# Patient Record
Sex: Female | Born: 2000 | Hispanic: Yes | Marital: Single | Smoking: Never smoker
Health system: Southern US, Community
[De-identification: ages and names within clinical notes are randomized; demographics above are authoritative.]

## PROBLEM LIST (undated history)

## (undated) ENCOUNTER — Inpatient Hospital Stay (HOSPITAL_COMMUNITY): Payer: Self-pay

## (undated) ENCOUNTER — Emergency Department (HOSPITAL_COMMUNITY): Admission: EM | Payer: Medicaid Other

## (undated) DIAGNOSIS — O24419 Gestational diabetes mellitus in pregnancy, unspecified control: Secondary | ICD-10-CM

## (undated) DIAGNOSIS — R519 Headache, unspecified: Secondary | ICD-10-CM

## (undated) DIAGNOSIS — Z789 Other specified health status: Secondary | ICD-10-CM

## (undated) HISTORY — DX: Headache, unspecified: R51.9

## (undated) HISTORY — PX: NO PAST SURGERIES: SHX2092

---
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
## 2019-01-02 ENCOUNTER — Telehealth: Payer: Self-pay | Admitting: Radiology

## 2019-01-02 NOTE — Telephone Encounter (Signed)
Called patient to get correct mailing address, letter sent back wrong address, unable to leave message due to mailbox being full

## 2019-01-03 ENCOUNTER — Encounter (INDEPENDENT_AMBULATORY_CARE_PROVIDER_SITE_OTHER): Payer: Self-pay | Admitting: Primary Care

## 2019-01-03 ENCOUNTER — Other Ambulatory Visit: Payer: Self-pay

## 2019-01-03 ENCOUNTER — Ambulatory Visit (INDEPENDENT_AMBULATORY_CARE_PROVIDER_SITE_OTHER): Payer: Medicaid Other | Admitting: Primary Care

## 2019-01-03 ENCOUNTER — Ambulatory Visit (INDEPENDENT_AMBULATORY_CARE_PROVIDER_SITE_OTHER): Payer: Self-pay | Admitting: Primary Care

## 2019-01-03 VITALS — Ht 62.0 in

## 2019-01-03 DIAGNOSIS — G43009 Migraine without aura, not intractable, without status migrainosus: Secondary | ICD-10-CM

## 2019-01-03 DIAGNOSIS — L729 Follicular cyst of the skin and subcutaneous tissue, unspecified: Secondary | ICD-10-CM

## 2019-01-03 DIAGNOSIS — Z7689 Persons encountering health services in other specified circumstances: Secondary | ICD-10-CM

## 2019-01-03 NOTE — Progress Notes (Signed)
Virtual Visit via Telephone Note  I connected with Janne Lab on 01/03/19 at 10:30 AM EDT by telephone and verified that I am speaking with the correct person using two identifiers.   I discussed the limitations, risks, security and privacy concerns of performing an evaluation and management service by telephone and the availability of in person appointments. I also discussed with the patient that there may be a patient responsible charge related to this service. The patient expressed understanding and agreed to proceed.   History of Present Illness: Ms Dlisa Barnwell presents today to establish care . She is concern with a bump on her face that seems to be getting larger, painful and itching  .   Past Medical History:  Diagnosis Date  . Gestational diabetes   . Medical history non-contributory    Observations/Objective: Review of Systems  Constitutional:       Weight gain  Gastrointestinal: Positive for heartburn, nausea and vomiting.  Skin:       cyst    Assessment and Plan: Neva was seen today for new patient (initial visit).  Diagnoses and all orders for this visit:  Migraine without aura and without status migrainosus, not intractable Signs can be individualized with throbbing pain on any area of the head.  They may come with an aura-dizziness, nausea, sensitive to light sound or smell.  Triggers can be caused by drinking alcohol smoking or certain medications, eating and drinking certain products  This condition may be triggered or caused by: Caffeine, aged cheese, and chocolate.  Encounter to establish care Ms. Robyne Askew is estabhling care now 66 with child transferring from pediatrics and  obgyn   Cyst of subcutaneous tissue Patient to follow up in person visit          Follow Up Instructions:    I discussed the assessment and treatment plan with the patient. The patient was provided an opportunity to ask questions and all were  answered. The patient agreed with the plan and demonstrated an understanding of the instructions.   The patient was advised to call back or seek an in-person evaluation if the symptoms worsen or if the condition fails to improve as anticipated.  I provided 22 minutes of non-face-to-face time during this encounter.   Kerin Perna, NP  Pt has huge bump on face, will not go away. Recently began to hurt and itch  Pt complains of weight gain Pt complains that she gets nauseous out of nowhere. There are times she can not eat due to being nauseous.

## 2019-01-04 ENCOUNTER — Other Ambulatory Visit: Payer: Self-pay

## 2019-01-04 ENCOUNTER — Ambulatory Visit (INDEPENDENT_AMBULATORY_CARE_PROVIDER_SITE_OTHER): Payer: Medicaid Other | Admitting: Primary Care

## 2019-01-04 ENCOUNTER — Encounter (INDEPENDENT_AMBULATORY_CARE_PROVIDER_SITE_OTHER): Payer: Self-pay | Admitting: Primary Care

## 2019-01-04 VITALS — BP 106/72 | HR 94 | Temp 98.5°F | Ht 62.0 in | Wt 165.4 lb

## 2019-01-04 DIAGNOSIS — D2330 Other benign neoplasm of skin of unspecified part of face: Secondary | ICD-10-CM

## 2019-01-04 DIAGNOSIS — Z8632 Personal history of gestational diabetes: Secondary | ICD-10-CM | POA: Diagnosis not present

## 2019-01-04 NOTE — Progress Notes (Signed)
   Acute Office Visit  Subjective:    Patient ID: Robin Strickland, female    DOB: 03/20/2001, 17 y.o.   MRN: [id number]  No chief complaint on file.   HPI Patient is in today for in person visit to evaluate nodule on her face initially was small and getting larger firm cyst no tenderness  Past Medical History:  Diagnosis Date  . Gestational diabetes   . Medical history non-contributory     Past Surgical History:  Procedure Laterality Date  . NO PAST SURGERIES      Family History  Problem Relation Age of Onset  . Healthy Mother   . Healthy Father     Social History   Socioeconomic History  . Marital status: Married    Spouse name: Not on file  . Number of children: Not on file  . Years of education: Not on file  . Highest education level: Not on file  Occupational History  . Not on file  Social Needs  . Financial resource strain: Not on file  . Food insecurity    Worry: Not on file    Inability: Not on file  . Transportation needs    Medical: Not on file    Non-medical: Not on file  Tobacco Use  . Smoking status: Never Smoker  . Smokeless tobacco: Never Used  Substance and Sexual Activity  . Alcohol use: No  . Drug use: No  . Sexual activity: Yes    Birth control/protection: None  Lifestyle  . Physical activity    Days per week: Not on file    Minutes per session: Not on file  . Stress: Not on file  Relationships  . Social connections    Talks on phone: Not on file    Gets together: Not on file    Attends religious service: Not on file    Active member of club or organization: Not on file    Attends meetings of clubs or organizations: Not on file    Relationship status: Not on file  . Intimate partner violence    Fear of current or ex partner: Not on file    Emotionally abused: Not on file    Physically abused: Not on file    Forced sexual activity: Not on file  Other Topics Concern  . Not on file  Social History Narrative   . Not on file    No outpatient medications prior to visit.   No facility-administered medications prior to visit.     No Known Allergies  Review of Systems  Skin:       Cyst see picture       Objective:    Physical Exam  Constitutional: She is oriented to person, place, and time. She appears well-developed and well-nourished.  HENT:  Head: Normocephalic.  Eyes: EOM are normal.  Neck: Neck supple.  Cardiovascular: Normal rate and regular rhythm.  Abdominal: Soft. Bowel sounds are normal. She exhibits distension.  Neurological: She is oriented to person, place, and time.  Skin:  cyst  Psychiatric: She has a normal mood and affect.    BP 106/72 (BP Location: Right Arm, Patient Position: Sitting, Cuff Size: Normal)   Pulse 94   Temp 98.5 F (36.9 C) (Oral)   Ht 5' 2" (1.575 m)   Wt 165 lb 6.4 oz (75 kg)   LMP 12/27/2018 (Approximate)   SpO2 98%   BMI 30.25 kg/m  Wt Readings from Last 3 Encounters:    01/04/19 165 lb 6.4 oz (75 kg) (91 %, Z= 1.34)*  10/31/18 167 lb (75.8 kg) (92 %, Z= 1.39)*  10/28/17 145 lb (65.8 kg) (81 %, Z= 0.88)*   * Growth percentiles are based on CDC (Girls, 2-20 Years) data.    Health Maintenance Due  Topic Date Due  . CHLAMYDIA SCREENING  05/19/2017    There are no preventive care reminders to display for this patient.   No results found for: TSH Lab Results  Component Value Date   WBC 16.2 (H) 06/16/2016   HGB 12.4 06/16/2016   HCT 35.6 (L) 06/16/2016   MCV 89.9 06/16/2016   PLT 361 06/16/2016   Lab Results  Component Value Date   GLUCOSE 69 04/16/2016   No results found for: CHOL No results found for: HDL No results found for: LDLCALC No results found for: TRIG No results found for: CHOLHDL No results found for: HGBA1C     Assessment & Plan:   Problem List Items Addressed This Visit    History of gestational diabetes   Relevant Orders   CBC with Differential   CMP14+EGFR   Hemoglobin A1c    Other Visit  Diagnoses    Sebaceous adenoma of face    -  Primary   Relevant Orders   Ambulatory referral to Dermatology     Diagnoses and all orders for this visit:  Sebaceous adenoma of face   Refer to dermatology    No orders of the defined types were placed in this encounter.    Kerin Perna, NP

## 2019-01-04 NOTE — Patient Instructions (Signed)
Epidermal Cyst  An epidermal cyst is a sac made of skin tissue. The sac contains a substance called keratin. Keratin is a protein that is normally secreted through the hair follicles. When keratin becomes trapped in the top layer of skin (epidermis), it can form an epidermal cyst. Epidermal cysts can be found anywhere on your body. These cysts are usually harmless (benign), and they may not cause symptoms unless they become infected. What are the causes? This condition may be caused by:  A blocked hair follicle.  A hair that curls and re-enters the skin instead of growing straight out of the skin (ingrown hair).  A blocked pore.  Irritated skin.  An injury to the skin.  Certain conditions that are passed along from parent to child (inherited).  Human papillomavirus (HPV).  Long-term (chronic) sun damage to the skin. What increases the risk? The following factors may make you more likely to develop an epidermal cyst:  Having acne.  Being overweight.  Being 30-40 years old. What are the signs or symptoms? The only symptom of this condition may be a small, painless lump underneath the skin. When an epidermal cyst ruptures, it may become infected. Symptoms may include:  Redness.  Inflammation.  Tenderness.  Warmth.  Fever.  Keratin draining from the cyst. Keratin is grayish-white, bad-smelling substance.  Pus draining from the cyst. How is this diagnosed? This condition is diagnosed with a physical exam.  In some cases, you may have a sample of tissue (biopsy) taken from your cyst to be examined under a microscope or tested for bacteria.  You may be referred to a health care provider who specializes in skin care (dermatologist). How is this treated? In many cases, epidermal cysts go away on their own without treatment. If a cyst becomes infected, treatment may include:  Opening and draining the cyst, done by a health care provider. After draining, minor surgery to  remove the rest of the cyst may be done.  Antibiotic medicine.  Injections of medicines (steroids) that help to reduce inflammation.  Surgery to remove the cyst. Surgery may be done if the cyst: ? Becomes large. ? Bothers you. ? Has a chance of turning into cancer.  Do not try to open a cyst yourself. Follow these instructions at home:  Take over-the-counter and prescription medicines only as told by your health care provider.  If you were prescribed an antibiotic medicine, take it it as told by your health care provider. Do not stop using the antibiotic even if you start to feel better.  Keep the area around your cyst clean and dry.  Wear loose, dry clothing.  Avoid touching your cyst.  Check your cyst every day for signs of infection. Check for: ? Redness, swelling, or pain. ? Fluid or blood. ? Warmth. ? Pus or a bad smell.  Keep all follow-up visits as told by your health care provider. This is important. How is this prevented?  Wear clean, dry, clothing.  Avoid wearing tight clothing.  Keep your skin clean and dry. Take showers or baths every day. Contact a health care provider if:  Your cyst develops symptoms of infection.  Your condition is not improving or is getting worse.  You develop a cyst that looks different from other cysts you have had.  You have a fever. Get help right away if:  Redness spreads from the cyst into the surrounding area. Summary  An epidermal cyst is a sac made of skin tissue. These cysts are   usually harmless (benign), and they may not cause symptoms unless they become infected.  If a cyst becomes infected, treatment may include surgery to open and drain the cyst, or to remove it. Treatment may also include medicines by mouth or through an injection.  Take over-the-counter and prescription medicines only as told by your health care provider. If you were prescribed an antibiotic medicine, take it as told by your health care  provider. Do not stop using the antibiotic even if you start to feel better.  Contact a health care provider if your condition is not improving or is getting worse.  Keep all follow-up visits as told by your health care provider. This is important. This information is not intended to replace advice given to you by your health care provider. Make sure you discuss any questions you have with your health care provider. Document Released: 04/24/2004 Document Revised: 09/14/2018 Document Reviewed: 12/05/2017 Elsevier Patient Education  2020 [employer]  

## 2019-01-05 ENCOUNTER — Other Ambulatory Visit (INDEPENDENT_AMBULATORY_CARE_PROVIDER_SITE_OTHER): Payer: Medicaid Other

## 2019-01-15 ENCOUNTER — Telehealth (INDEPENDENT_AMBULATORY_CARE_PROVIDER_SITE_OTHER): Payer: Self-pay

## 2019-01-15 NOTE — Telephone Encounter (Signed)
Patient called to check on status of referral. She also requested a prescription for masks and gloves to be sent to [facility]. She has been told by a pharmacist that medicaid will cover. Nat Christen, CMA

## 2019-01-17 ENCOUNTER — Other Ambulatory Visit (INDEPENDENT_AMBULATORY_CARE_PROVIDER_SITE_OTHER): Payer: Self-pay | Admitting: Primary Care

## 2019-01-17 NOTE — Progress Notes (Unsigned)
m °

## 2019-01-17 NOTE — Telephone Encounter (Signed)
Unable to e prescribe mask and gloves v.o. sent to [facility]

## 2019-01-31 ENCOUNTER — Encounter (INDEPENDENT_AMBULATORY_CARE_PROVIDER_SITE_OTHER): Payer: Self-pay | Admitting: Primary Care

## 2019-01-31 ENCOUNTER — Ambulatory Visit (INDEPENDENT_AMBULATORY_CARE_PROVIDER_SITE_OTHER): Payer: Medicaid Other | Admitting: Primary Care

## 2019-01-31 ENCOUNTER — Other Ambulatory Visit: Payer: Self-pay

## 2019-01-31 VITALS — BP 109/66 | HR 98 | Temp 98.7°F | Ht 62.0 in | Wt 168.0 lb

## 2019-01-31 DIAGNOSIS — G4700 Insomnia, unspecified: Secondary | ICD-10-CM | POA: Diagnosis not present

## 2019-01-31 DIAGNOSIS — Z8632 Personal history of gestational diabetes: Secondary | ICD-10-CM | POA: Diagnosis not present

## 2019-01-31 DIAGNOSIS — N898 Other specified noninflammatory disorders of vagina: Secondary | ICD-10-CM

## 2019-01-31 DIAGNOSIS — G44229 Chronic tension-type headache, not intractable: Secondary | ICD-10-CM | POA: Diagnosis not present

## 2019-01-31 LAB — POCT URINALYSIS DIPSTICK
Bilirubin, UA: NEGATIVE
Glucose, UA: NEGATIVE
Ketones, UA: NEGATIVE
Leukocytes, UA: NEGATIVE
Nitrite, UA: NEGATIVE
Protein, UA: POSITIVE — AB
Spec Grav, UA: >=1.03 — AB (ref 1.010–1.025)
Urobilinogen, UA: 0.2 E.U./dL
pH, UA: 5.5 (ref 5.0–8.0)

## 2019-01-31 MED ORDER — IBUPROFEN 600 MG PO TABS: 600.0000 mg | ORAL_TABLET | Freq: Three times a day (TID) | ORAL | 0 refills | Status: DC | PRN

## 2019-01-31 MED ORDER — IBUPROFEN 600 MG PO TABS: 600.0000 mg | ORAL_TABLET | Freq: Three times a day (TID) | ORAL | 1 refills | Status: DC | PRN

## 2019-01-31 NOTE — Progress Notes (Signed)
Pt is having trouble sleeping through the night, she sometimes has issues falling asleep. Lack of sleep is causing migraines with nausea and vomiting

## 2019-01-31 NOTE — Patient Instructions (Signed)
Can try melatonin 5mg-15 mg at night for sleep, can also do benadryl 25-50mg at night for sleep.  If this does not help we can try prescription medication.  Also here is some information about good sleep hygiene.   Insomnia Insomnia is frequent trouble falling and/or staying asleep. Insomnia can be a long term problem or a short term problem. Both are common. Insomnia can be a short term problem when the wakefulness is related to a certain stress or worry. Long term insomnia is often related to ongoing stress during waking hours and/or poor sleeping habits. Overtime, sleep deprivation itself can make the problem worse. Every little thing feels more severe because you are overtired and your ability to cope is decreased. CAUSES  Stress, anxiety, and depression. Poor sleeping habits. Distractions such as TV in the bedroom. Naps close to bedtime. Engaging in emotionally charged conversations before bed. Technical reading before sleep. Alcohol and other sedatives. They may make the problem worse. They can hurt normal sleep patterns and normal dream activity. Stimulants such as caffeine for several hours prior to bedtime. Pain syndromes and shortness of breath can cause insomnia. Exercise late at night. Changing time zones may cause sleeping problems (jet lag). It is sometimes helpful to have someone observe your sleeping patterns. They should look for periods of not breathing during the night (sleep apnea). They should also look to see how long those periods last. If you live alone or observers are uncertain, you can also be observed at a sleep clinic where your sleep patterns will be professionally monitored. Sleep apnea requires a checkup and treatment. Give your caregivers your medical history. Give your caregivers observations your family has made about your sleep.  SYMPTOMS  Not feeling rested in the morning. Anxiety and restlessness at bedtime. Difficulty falling and staying asleep. TREATMENT   Your caregiver may prescribe treatment for an underlying medical disorders. Your caregiver can give advice or help if you are using alcohol or other drugs for self-medication. Treatment of underlying problems will usually eliminate insomnia problems. Medications can be prescribed for short time use. They are generally not recommended for lengthy use. Over-the-counter sleep medicines are not recommended for lengthy use. They can be habit forming. You can promote easier sleeping by making lifestyle changes such as: Using relaxation techniques that help with breathing and reduce muscle tension. Exercising earlier in the day. Changing your diet and the time of your last meal. No night time snacks. Establish a regular time to go to bed. Counseling can help with stressful problems and worry. Soothing music and white noise may be helpful if there are background noises you cannot remove. Stop tedious detailed work at least one hour before bedtime. HOME CARE INSTRUCTIONS  Keep a diary. Inform your caregiver about your progress. This includes any medication side effects. See your caregiver regularly. Take note of: Times when you are asleep. Times when you are awake during the night. The quality of your sleep. How you feel the next day. This information will help your caregiver care for you. Get out of bed if you are still awake after 15 minutes. Read or do some quiet activity. Keep the lights down. Wait until you feel sleepy and go back to bed. Keep regular sleeping and waking hours. Avoid naps. Exercise regularly. Avoid distractions at bedtime. Distractions include watching television or engaging in any intense or detailed activity like attempting to balance the household checkbook. Develop a bedtime ritual. Keep a familiar routine of bathing, brushing your teeth,   climbing into bed at the same time each night, listening to soothing music. Routines increase the success of falling to sleep faster. Use  relaxation techniques. This can be using breathing and muscle tension release routines. It can also include visualizing peaceful scenes. You can also help control troubling or intruding thoughts by keeping your mind occupied with boring or repetitive thoughts like the old concept of counting sheep. You can make it more creative like imagining planting one beautiful flower after another in your backyard garden. During your day, work to eliminate stress. When this is not possible use some of the previous suggestions to help reduce the anxiety that accompanies stressful situations. MAKE SURE YOU:  Understand these instructions. Will watch your condition. Will get help right away if you are not doing well or get worse. Document Released: 05/21/2000 Document Revised: 08/16/2011 Document Reviewed: 06/21/2007 Exit[employer] Patient Information 2015 [employer]. This information is not intended to replace advice given to you by your health care provider. Make sure you discuss any questions you have with your health care provider.  

## 2019-01-31 NOTE — Progress Notes (Signed)
` Established Patient Office Visit  Subjective:  Patient ID: Robin Strickland, female    DOB: Dec 09, 2000  Age: 18 y.o. MRN: [id number]  CC:  Chief Complaint  Patient presents with  . vaginal odor    HPI Robin Strickland presents for vaginal odor and discharge. She is also complaining of headaches everyday due to the lack of sleep.   Past Medical History:  Diagnosis Date  . Gestational diabetes   . Medical history non-contributory     Past Surgical History:  Procedure Laterality Date  . NO PAST SURGERIES      Family History  Problem Relation Age of Onset  . Healthy Mother   . Healthy Father     Social History   Socioeconomic History  . Marital status: Married    Spouse name: Not on file  . Number of children: Not on file  . Years of education: Not on file  . Highest education level: Not on file  Occupational History  . Not on file  Social Needs  . Financial resource strain: Not on file  . Food insecurity    Worry: Not on file    Inability: Not on file  . Transportation needs    Medical: Not on file    Non-medical: Not on file  Tobacco Use  . Smoking status: Never Smoker  . Smokeless tobacco: Never Used  Substance and Sexual Activity  . Alcohol use: No  . Drug use: No  . Sexual activity: Yes    Birth control/protection: None  Lifestyle  . Physical activity    Days per week: Not on file    Minutes per session: Not on file  . Stress: Not on file  Relationships  . Social Herbalist on phone: Not on file    Gets together: Not on file    Attends religious service: Not on file    Active member of club or organization: Not on file    Attends meetings of clubs or organizations: Not on file    Relationship status: Not on file  . Intimate partner violence    Fear of current or ex partner: Not on file    Emotionally abused: Not on file    Physically abused: Not on file    Forced sexual activity: Not on file  Other  Topics Concern  . Not on file  Social History Narrative  . Not on file    No outpatient medications prior to visit.   No facility-administered medications prior to visit.     No Known Allergies  ROS Review of Systems  Constitutional: Positive for fatigue.  Gastrointestinal: Positive for nausea.  Genitourinary: Positive for vaginal bleeding.       Menstrual cycle   Psychiatric/Behavioral: Positive for sleep disturbance.      Objective:    Physical Exam  Constitutional: She is oriented to person, place, and time. She appears well-developed.  HENT:  Head: Normocephalic.  Neck: Neck supple.  Cardiovascular: Normal rate and regular rhythm.  Pulmonary/Chest: Effort normal and breath sounds normal.  Abdominal: Soft. Bowel sounds are normal. She exhibits distension.  Musculoskeletal: Normal range of motion.  Neurological: She is oriented to person, place, and time.  Skin: Skin is warm and dry.  Psychiatric: She has a normal mood and affect.    BP 109/66 (BP Location: Left Arm, Patient Position: Sitting, Cuff Size: Normal)   Pulse 98   Temp 98.7 F (37.1 C) (Tympanic)   Ht  _0  (1.575 m)   Wt 168 lb (76.2 kg)   LMP 01/28/2019 (Approximate)   SpO2 97%   BMI 30.73 kg/m  Wt Readings from Last 3 Encounters:  01/31/19 168 lb (76.2 kg) (92 %, Z= 1.39)*  01/04/19 165 lb 6.4 oz (75 kg) (91 %, Z= 1.34)*  10/31/18 167 lb (75.8 kg) (92 %, Z= 1.39)*   * Growth percentiles are based on CDC (Girls, 2-20 Years) data.     Health Maintenance Due  Topic Date Due  . CHLAMYDIA SCREENING  05/19/2017  . INFLUENZA VACCINE  01/06/2019    There are no preventive care reminders to display for this patient.  No results found for: TSH Lab Results  Component Value Date   WBC 16.2 (H) 06/16/2016   HGB 12.4 06/16/2016   HCT 35.6 (L) 06/16/2016   MCV 89.9 06/16/2016   PLT 361 06/16/2016   Lab Results  Component Value Date   GLUCOSE 69 04/16/2016     Assessment & Plan:    Robin Strickland was seen today for vaginal odor.  Diagnoses and all orders for this visit:  Vaginal odor Return in 1 week for vaginal discharge currently on her menstrual cycle -     Urinalysis Dipstick  History of gestational diabetes -     Hemoglobin A1c -     CMP14+EGFR -     CBC with Differential  Chronic tension-type headache, not intractable if worsening HA, changes vision/speech, imbalance, weakness go to the ER  Prescribe ibuprofen 600 mg TIDPRN  Insomnia, unspecified type Insomnia is frequent trouble falling and/or staying asleep. Insomnia can be a long term problem or a short term problem. Both are common. Insomnia can be a short term problem when the wakefulness is related to a certain stress or worry. Long term insomnia is often related to ongoing stress during waking hours and/or poor sleeping habits. Overtime, sleep deprivation itself can make the problem worse. Every little thing feels more severe because you are overtired and your ability to cope is decreased. Can try melatonin 87m-15 mg at night for sleep, can also do benadryl 25-556mat night for sleep.   Meds ordered this encounter  Medications  . DISCONTD: ibuprofen (ADVIL) 600 MG tablet    Sig: Take 1 tablet (600 mg total) by mouth every 8 (eight) hours as needed.    Dispense:  30 tablet    Refill:  0  . ibuprofen (ADVIL) 600 MG tablet    Sig: Take 1 tablet (600 mg total) by mouth every 8 (eight) hours as needed.    Dispense:  60 tablet    Refill:  1    Follow-up: Return today (on 01/31/2019) for next week on Thursday if avaiable vaginal discharge.    MiKerin PernaNP

## 2019-02-01 LAB — CMP14+EGFR
ALT: 24 IU/L (ref 0–32)
AST: 19 IU/L (ref 0–40)
Albumin/Globulin Ratio: 1.6 (ref 1.2–2.2)
Albumin: 4.6 g/dL (ref 3.9–5.0)
Alkaline Phosphatase: 87 IU/L (ref 43–101)
BUN/Creatinine Ratio: 12 (ref 9–23)
BUN: 12 mg/dL (ref 6–20)
Bilirubin Total: 0.7 mg/dL (ref 0.0–1.2)
CO2: 22 mmol/L (ref 20–29)
Calcium: 9.4 mg/dL (ref 8.7–10.2)
Chloride: 104 mmol/L (ref 96–106)
Creatinine, Ser: 0.97 mg/dL (ref 0.57–1.00)
GFR calc Af Amer: 99 mL/min/{1.73_m2} (ref >59)
GFR calc non Af Amer: 86 mL/min/{1.73_m2} (ref >59)
Globulin, Total: 2.9 g/dL (ref 1.5–4.5)
Glucose: 122 mg/dL — ABNORMAL HIGH (ref 65–99)
Potassium: 4.3 mmol/L (ref 3.5–5.2)
Sodium: 140 mmol/L (ref 134–144)
Total Protein: 7.5 g/dL (ref 6.0–8.5)

## 2019-02-01 LAB — CBC WITH DIFFERENTIAL/PLATELET
Basophils Absolute: 0 10*3/uL (ref 0.0–0.2)
Basos: 1 %
EOS (ABSOLUTE): 0.2 10*3/uL (ref 0.0–0.4)
Eos: 2 %
Hematocrit: 37.9 % (ref 34.0–46.6)
Hemoglobin: 13 g/dL (ref 11.1–15.9)
Immature Grans (Abs): 0 10*3/uL (ref 0.0–0.1)
Immature Granulocytes: 0 %
Lymphocytes Absolute: 3.3 10*3/uL — ABNORMAL HIGH (ref 0.7–3.1)
Lymphs: 41 %
MCH: 30.5 pg (ref 26.6–33.0)
MCHC: 34.3 g/dL (ref 31.5–35.7)
MCV: 89 fL (ref 79–97)
Monocytes Absolute: 0.5 10*3/uL (ref 0.1–0.9)
Monocytes: 7 %
Neutrophils Absolute: 4 10*3/uL (ref 1.4–7.0)
Neutrophils: 49 %
Platelets: 333 10*3/uL (ref 150–450)
RBC: 4.26 x10E6/uL (ref 3.77–5.28)
RDW: 13 % (ref 11.7–15.4)
WBC: 8.2 10*3/uL (ref 3.4–10.8)

## 2019-02-01 LAB — HEMOGLOBIN A1C
Est. average glucose Bld gHb Est-mCnc: 111 mg/dL
Hgb A1c MFr Bld: 5.5 % (ref 4.8–5.6)

## 2019-02-07 ENCOUNTER — Encounter: Payer: Self-pay | Admitting: *Deleted

## 2019-02-07 ENCOUNTER — Encounter: Payer: Self-pay | Admitting: Advanced Practice Midwife

## 2019-02-07 ENCOUNTER — Ambulatory Visit (INDEPENDENT_AMBULATORY_CARE_PROVIDER_SITE_OTHER): Payer: Medicaid Other | Admitting: Advanced Practice Midwife

## 2019-02-07 ENCOUNTER — Other Ambulatory Visit: Payer: Self-pay

## 2019-02-07 VITALS — BP 111/74 | HR 76 | Wt 168.0 lb

## 2019-02-07 DIAGNOSIS — Z3046 Encounter for surveillance of implantable subdermal contraceptive: Secondary | ICD-10-CM

## 2019-02-07 NOTE — Patient Instructions (Signed)
Nexplanon Instructions After Removal  Keep bandage clean and dry for 24 hours  May use ice/Tylenol/Ibuprofen for soreness or pain  If you develop fever, drainage or increased warmth from incision site-contact office immediately   

## 2019-02-07 NOTE — Progress Notes (Signed)
     GYNECOLOGY OFFICE PROCEDURE NOTE  Robin Strickland is a 18 y.o. G1P1001 here for Nexplanon removal. No pap history (age 85).  No other gynecologic concerns. Patient states she believes her Nexplanon is the cause of her headaches and she was advised to have it removed before additional headache workup. She reports she was "planning to have it out anyway" and "just don't like it".   Nexplanon Removal Patient identified, informed consent performed, consent signed.   Appropriate time out taken. Nexplanon site identified.  Area prepped in usual sterile fashon. One ml of 1% lidocaine was used to anesthetize the area at the distal end of the implant. A small stab incision was made right beside the implant on the distal portion.  The Nexplanon rod was grasped using hemostats and removed without difficulty.  There was minimal blood loss. There were no complications.  3 ml of 1% lidocaine was injected around the incision for post-procedure analgesia.  Steri-strips were applied over the small incision.  A pressure bandage was applied to reduce any bruising.  The patient tolerated the procedure well and was given post procedure instructions.  Patient is not planning to use contraception.  Total visit time: 30 minutes. Greater than 50% of visit spent in counseling and coordination or care.  Mallie Snooks, MSN, CNM Certified Nurse Midwife, [employer] for [employer], [employer] 02/07/19 10:20 AM

## 2019-02-14 DIAGNOSIS — L720 Epidermal cyst: Secondary | ICD-10-CM | POA: Diagnosis not present

## 2019-02-15 DIAGNOSIS — L728 Other follicular cysts of the skin and subcutaneous tissue: Secondary | ICD-10-CM | POA: Diagnosis not present

## 2019-03-05 DIAGNOSIS — L728 Other follicular cysts of the skin and subcutaneous tissue: Secondary | ICD-10-CM | POA: Diagnosis not present

## 2019-03-06 ENCOUNTER — Ambulatory Visit: Payer: Medicaid Other | Admitting: Family Medicine

## 2019-03-09 ENCOUNTER — Encounter: Payer: Medicaid Other | Admitting: Physician Assistant

## 2019-03-09 DIAGNOSIS — R519 Headache, unspecified: Secondary | ICD-10-CM

## 2019-03-12 ENCOUNTER — Ambulatory Visit (INDEPENDENT_AMBULATORY_CARE_PROVIDER_SITE_OTHER): Payer: Medicaid Other | Admitting: Primary Care

## 2019-03-19 DIAGNOSIS — H52223 Regular astigmatism, bilateral: Secondary | ICD-10-CM | POA: Diagnosis not present

## 2019-03-19 DIAGNOSIS — H5213 Myopia, bilateral: Secondary | ICD-10-CM | POA: Diagnosis not present

## 2019-04-04 ENCOUNTER — Other Ambulatory Visit (INDEPENDENT_AMBULATORY_CARE_PROVIDER_SITE_OTHER): Payer: Self-pay | Admitting: Primary Care

## 2019-04-06 ENCOUNTER — Ambulatory Visit (HOSPITAL_COMMUNITY)
Admission: EM | Admit: 2019-04-06 | Discharge: 2019-04-06 | Disposition: A | Discharge status: Home / Self Care | Payer: Medicaid Other | Attending: Family Medicine | Admitting: Family Medicine

## 2019-04-06 ENCOUNTER — Other Ambulatory Visit: Payer: Self-pay

## 2019-04-06 ENCOUNTER — Encounter (HOSPITAL_COMMUNITY): Payer: Self-pay | Admitting: Family Medicine

## 2019-04-06 DIAGNOSIS — L42 Pityriasis rosea: Secondary | ICD-10-CM

## 2019-04-06 NOTE — ED Provider Notes (Signed)
[place]    CSN: [id number] Arrival date & time: 04/06/19  1259      History   Chief Complaint Chief Complaint  Patient presents with  . Rash    HPI Robin Strickland is a 18 y.o. female.   Established [facility] patient with complaint of rash.  Patient developed a rash initially on the inside of her left thigh with a large oval patch.  Subsequently, she developed multiple oval patches on both sides of her body on her torso.  It is minimally itchy.  She has had no fever.  The rash began 2 weeks ago.     Past Medical History:  Diagnosis Date  . Gestational diabetes   . Medical history non-contributory     Patient Active Problem List   Diagnosis Date Noted  . Migraine without aura and without status migrainosus, not intractable 10/28/2017  . History of gestational diabetes 04/19/2016    Past Surgical History:  Procedure Laterality Date  . NO PAST SURGERIES      OB History    Gravida  1   Para  1   Term  1   Preterm      AB      Living  1     SAB      TAB      Ectopic      Multiple  0   Live Births  1            Home Medications    Prior to Admission medications   Medication Sig Start Date End Date Taking? Authorizing Provider  ibuprofen (ADVIL) 600 MG tablet TAKE 1 TABLET (600 MG TOTAL) BY MOUTH EVERY 8 (EIGHT) HOURS AS NEEDED. 04/04/19   Kerin Perna, NP    Family History Family History  Problem Relation Age of Onset  . Healthy Mother   . Healthy Father     Social History Social History   Tobacco Use  . Smoking status: Never Smoker  . Smokeless tobacco: Never Used  Substance Use Topics  . Alcohol use: No  . Drug use: No     Allergies   Patient has no known allergies.   Review of Systems Review of Systems  Skin: Positive for rash.  All other systems reviewed and are negative.    Physical Exam Triage Vital Signs ED Triage Vitals  Enc Vitals Group     BP      Pulse      Resp       Temp      Temp src      SpO2      Weight      Height      Head Circumference      Peak Flow      Pain Score      Pain Loc      Pain Edu?      Excl. in [place]?    No data found.  Updated Vital Signs BP 112/74 (BP Location: Left Arm)   Pulse 93   Temp 98.8 F (37.1 C) (Oral)   Resp 16   SpO2 98%    Physical Exam Vitals signs and nursing note reviewed.  Constitutional:      General: She is not in acute distress.    Appearance: Normal appearance.  Eyes:     Conjunctiva/sclera: Conjunctivae normal.  Neck:     Musculoskeletal: Normal range of motion and neck supple.  Cardiovascular:  Rate and Rhythm: Normal rate.  Pulmonary:     Effort: Pulmonary effort is normal.  Musculoskeletal: Normal range of motion.  Skin:    General: Skin is warm and dry.     Comments: Multiple oval patches lined up along dermatomes on torso.  Neurological:     General: No focal deficit present.     Mental Status: She is alert.  Psychiatric:        Mood and Affect: Mood normal.      UC Treatments / Results  Labs (all labs ordered are listed, but only abnormal results are displayed) Labs Reviewed - No data to display  EKG   Radiology No results found.  Procedures Procedures (including critical care time)  Medications Ordered in UC Medications - No data to display  Initial Impression / Assessment and Plan / UC Course  I have reviewed the triage vital signs and the nursing notes.  Pertinent labs & imaging results that were available during my care of the patient were reviewed by me and considered in my medical decision making (see chart for details).    Final Clinical Impressions(s) / UC Diagnoses   Final diagnoses:  Pityriasis rosea     Discharge Instructions     You can usually control the itching with Zyrtec (Cetirizine) 10 mg daily    ED Prescriptions    None     I have reviewed the PDMP during this encounter.   Robyn Haber, MD 04/06/19 1400

## 2019-04-06 NOTE — Discharge Instructions (Signed)
You can usually control the itching with Zyrtec (Cetirizine) 10 mg daily

## 2019-04-06 NOTE — ED Triage Notes (Signed)
Pt presents to UC w/ rash on left side of abdomen and down left leg x2 weeks.

## 2019-05-08 ENCOUNTER — Telehealth (INDEPENDENT_AMBULATORY_CARE_PROVIDER_SITE_OTHER): Payer: Medicaid Other | Admitting: Primary Care

## 2019-05-08 DIAGNOSIS — K219 Gastro-esophageal reflux disease without esophagitis: Secondary | ICD-10-CM | POA: Diagnosis not present

## 2019-05-08 DIAGNOSIS — R112 Nausea with vomiting, unspecified: Secondary | ICD-10-CM

## 2019-05-08 MED ORDER — OMEPRAZOLE 20 MG PO CPDR: 20.0000 mg | DELAYED_RELEASE_CAPSULE | Freq: Every day | ORAL | 3 refills | Status: DC

## 2019-05-08 NOTE — Progress Notes (Signed)
Virtual Visit via Telephone Note  I connected with Robin Strickland on 05/08/19 at  2:30 PM EST by telephone and verified that I am speaking with the correct person using two identifiers.   I discussed the limitations, risks, security and privacy concerns of performing an evaluation and management service by telephone and the availability of in person appointments. I also discussed with the patient that there may be a patient responsible charge related to this service. The patient expressed understanding and agreed to proceed.   History of Present Illness: Ms. Robin Strickland Robin Strickland is having a tele visit for nausea in the morning and stomach pain and burning not necessarily associated with or without food. Ask patient was she able to take a pregnancy test and call results-yes   Past Medical History:  Diagnosis Date  . Gestational diabetes   . Medical history non-contributory    Observations/Objective: Review of Systems  Gastrointestinal: Positive for abdominal pain, heartburn, nausea and vomiting.  All other systems reviewed and are negative.   Assessment and Plan: Diagnoses and all orders for this visit:  Nausea and vomiting, intractability of vomiting not specified, unspecified vomiting type Pregnancy test negative per patient return called. Try to eat small frequent meals Brat diet than advance to solid small meals   Gastroesophageal reflux disease without esophagitis Discussed eating small frequent meal, reduction in acidic foods, fried foods ,spicy foods, alcohol caffeine and tobacco and certain medications. Avoid laying down after eating 16mins-1hour, elevated head of the bed.  Other orders -     omeprazole (PRILOSEC) 20 MG capsule; Take 1 capsule (20 mg total) by mouth daily.    Follow Up Instructions:    I discussed the assessment and treatment plan with the patient. The patient was provided an opportunity to ask questions and all were answered.  The patient agreed with the plan and demonstrated an understanding of the instructions.   The patient was advised to call back or seek an in-person evaluation if the symptoms worsen or if the condition fails to improve as anticipated.  I provided 18 minutes of non-face-to-face time during this encounter.   Kerin Perna, NP

## 2019-06-06 ENCOUNTER — Other Ambulatory Visit: Payer: Self-pay

## 2019-06-06 ENCOUNTER — Encounter: Payer: Self-pay | Admitting: Family Medicine

## 2019-06-06 ENCOUNTER — Telehealth (INDEPENDENT_AMBULATORY_CARE_PROVIDER_SITE_OTHER): Payer: Medicaid Other | Admitting: Family Medicine

## 2019-06-06 DIAGNOSIS — N912 Amenorrhea, unspecified: Secondary | ICD-10-CM | POA: Diagnosis not present

## 2019-06-06 DIAGNOSIS — O219 Vomiting of pregnancy, unspecified: Secondary | ICD-10-CM

## 2019-06-06 DIAGNOSIS — Z3A01 Less than 8 weeks gestation of pregnancy: Secondary | ICD-10-CM | POA: Diagnosis not present

## 2019-06-06 DIAGNOSIS — Z8632 Personal history of gestational diabetes: Secondary | ICD-10-CM | POA: Diagnosis not present

## 2019-06-06 DIAGNOSIS — Z3480 Encounter for supervision of other normal pregnancy, unspecified trimester: Secondary | ICD-10-CM | POA: Insufficient documentation

## 2019-06-06 DIAGNOSIS — Z3490 Encounter for supervision of normal pregnancy, unspecified, unspecified trimester: Secondary | ICD-10-CM

## 2019-06-06 MED ORDER — PREPLUS 27-1 MG PO TABS: 1.0000 | ORAL_TABLET | Freq: Every day | ORAL | 13 refills | Status: DC

## 2019-06-06 MED ORDER — PROMETHAZINE HCL 25 MG PO TABS: 25.0000 mg | ORAL_TABLET | Freq: Four times a day (QID) | ORAL | 2 refills | Status: DC | PRN

## 2019-06-06 NOTE — Progress Notes (Signed)
I connected with@ on 06/06/19 at  9:45 AM EST by: Mychart vidoe and verified that I am speaking with the correct person using two identifiers.  Patient is located at home and provider is located at [facility].     The purpose of this virtual visit is to provide medical care while limiting exposure to the novel coronavirus. I discussed the limitations, risks, security and privacy concerns of performing an evaluation and management service by MyChart and the availability of in person appointments. I also discussed with the patient that there may be a patient responsible charge related to this service. By engaging in this virtual visit, you consent to the provision of healthcare.  Additionally, you authorize for your insurance to be billed for the services provided during this visit.  The patient expressed understanding and agreed to proceed.  The following staff members participated in the virtual visit:  Chirstine Soliz    Amenorrhea VISIT NOTE  Subjective:  Robin Strickland is a 18 y.o.  G2P1001 at [redacted]w[redacted]d  Having vitrual visit for nausea related to early pregnancy.   Patient reports nausea no vomiting. Denies leaking of fluid.   Nausea started several weeks ago.  Nuase was worsened in the last week. Sensitive to smells. No vomitting.  Gags but unable to eat antyhing. Able to drink water.   The following portions of the patient's history were reviewed and updated as appropriate: allergies, current medications, past family history, past medical history, past social history, past surgical history and problem list.   Objective:  There were no vitals filed for this visit. Self-Obtained  Fetal Status:           Assessment and Plan:  Pregnancy: G2P1001 at [redacted]w[redacted]d   1. Supervision of other normal pregnancy, antepartum Will return for new OB in about 4 wk Interested in NIPT-- does want to know sex of baby - Prenatal Vit-Fe Fumarate-FA (PREPLUS) 27-1 MG TABS; Take 1 tablet by mouth daily.   Dispense: 30 tablet; Refill: 13 - promethazine (PHENERGAN) 25 MG tablet; Take 1 tablet (25 mg total) by mouth every 6 (six) hours as needed for nausea or vomiting.  Dispense: 30 tablet; Refill: 2  2. History of gestational diabetes Needs early screening  3. Nausea and vomiting during pregnancy Reviewed using B6 and unisom Discussed small frequent meals Reviewed importance of drinking fluids and when to seek care based on vomitting and hydration status - promethazine (PHENERGAN) 25 MG tablet; Take 1 tablet (25 mg total) by mouth every 6 (six) hours as needed for nausea or vomiting.  Dispense: 30 tablet; Refill: 2  4. Amenorrhea UPT positive  5. Early stage of pregnancy UPT pos, unsure LMP. Will need [place] at new OB to confirm dates   Face to face time:  25 minutes  Greater than 50% of the visit time was spent in counseling and coordination of care with the patient.  The summary and outline of the counseling and care coordination is summarized in the note above.   All questions were answered.   Preterm labor symptoms and general obstetric precautions including but not limited to vaginal bleeding, contractions, leaking of fluid and fetal movement were reviewed in detail with the patient.  Return in about 3 weeks (around 06/27/2019) for Initial OB visit.  Future Appointments  Date Time Provider [place]  06/27/2019 10:15 AM Darlina Rumpf, CNM [facility]     Time spent on virtual visit: 25 minutes  Caren Macadam, MD

## 2019-06-06 NOTE — Progress Notes (Signed)
hasn't been able to eat anything in the past couple days.   Took 2 UPT's this past Sunday and both were positive. LMP 11/12.  I connected with  Janne Lab on 06/06/19 at  9:45 AM EST by telephone and verified that I am speaking with the correct person using two identifiers.   I discussed the limitations, risks, security and privacy concerns of performing an evaluation and management service by telephone and the availability of in person appointments. I also discussed with the patient that there may be a patient responsible charge related to this service. The patient expressed understanding and agreed to proceed.  Crosby Oyster, RN 06/06/2019  9:47 AM

## 2019-06-08 NOTE — L&D Delivery Note (Signed)
Delivery Note At 5:57 AM a viable female was delivered via Vaginal, Spontaneous (Presentation: right Occiput Anterior).  APGAR: 9, 9; weight.   Placenta status: Spontaneous, Intact.  Cord: 3 vessels with the following complications: None.  Cord blood obtained.   Anesthesia: Epidural Episiotomy: None Lacerations: None Suture Repair: not applicable Est. Blood Loss: 50 ml    Mom to postpartum.  Baby to Couplet care / Skin to Skin.  Nunzio Cory Crowley-Haywood 01/19/2020, 6:20 AM

## 2019-06-27 ENCOUNTER — Other Ambulatory Visit (HOSPITAL_COMMUNITY)
Admission: RE | Admit: 2019-06-27 | Discharge: 2019-06-27 | Disposition: A | Discharge status: Home / Self Care | Payer: Medicaid Other | Source: Ambulatory Visit | Attending: Advanced Practice Midwife | Admitting: Advanced Practice Midwife

## 2019-06-27 ENCOUNTER — Ambulatory Visit (INDEPENDENT_AMBULATORY_CARE_PROVIDER_SITE_OTHER): Payer: Medicaid Other | Admitting: Advanced Practice Midwife

## 2019-06-27 ENCOUNTER — Other Ambulatory Visit: Payer: Self-pay

## 2019-06-27 ENCOUNTER — Encounter: Payer: Self-pay | Admitting: Advanced Practice Midwife

## 2019-06-27 VITALS — BP 119/73 | HR 80 | Wt 165.0 lb

## 2019-06-27 DIAGNOSIS — Z3481 Encounter for supervision of other normal pregnancy, first trimester: Secondary | ICD-10-CM | POA: Insufficient documentation

## 2019-06-27 DIAGNOSIS — Z3A09 9 weeks gestation of pregnancy: Secondary | ICD-10-CM | POA: Diagnosis not present

## 2019-06-27 DIAGNOSIS — O99611 Diseases of the digestive system complicating pregnancy, first trimester: Secondary | ICD-10-CM

## 2019-06-27 DIAGNOSIS — Z23 Encounter for immunization: Secondary | ICD-10-CM | POA: Diagnosis not present

## 2019-06-27 DIAGNOSIS — Z8632 Personal history of gestational diabetes: Secondary | ICD-10-CM

## 2019-06-27 DIAGNOSIS — Z362 Encounter for other antenatal screening follow-up: Secondary | ICD-10-CM | POA: Diagnosis not present

## 2019-06-27 DIAGNOSIS — O99210 Obesity complicating pregnancy, unspecified trimester: Secondary | ICD-10-CM

## 2019-06-27 DIAGNOSIS — K219 Gastro-esophageal reflux disease without esophagitis: Secondary | ICD-10-CM

## 2019-06-27 MED ORDER — ASPIRIN EC 81 MG PO TBEC: 81.0000 mg | DELAYED_RELEASE_TABLET | Freq: Every day | ORAL | 2 refills | Status: DC

## 2019-06-27 MED ORDER — BLOOD PRESSURE KIT: 1.0000 | PACK | 0 refills | Status: DC

## 2019-06-27 NOTE — Patient Instructions (Addendum)
First Trimester of Pregnancy  The first trimester of pregnancy is from week 1 until the end of week 13 (months 1 through 3). During this time, your baby will begin to develop inside you. At 6-8 weeks, the eyes and face are formed, and the heartbeat can be seen on ultrasound. At the end of 12 weeks, all the baby's organs are formed. Prenatal care is all the medical care you receive before the birth of your baby. Make sure you get good prenatal care and follow all of your doctor's instructions. Follow these instructions at home: Medicines  Take over-the-counter and prescription medicines only as told by your doctor. Some medicines are safe and some medicines are not safe during pregnancy.  Take a prenatal vitamin that contains at least 600 micrograms (mcg) of folic acid.  If you have trouble pooping (constipation), take medicine that will make your stool soft (stool softener) if your doctor approves. Eating and drinking   Eat regular, healthy meals.  Your doctor will tell you the amount of weight gain that is right for you.  Avoid raw meat and uncooked cheese.  If you feel sick to your stomach (nauseous) or throw up (vomit): ? Eat 4 or 5 small meals a day instead of 3 large meals. ? Try eating a few soda crackers. ? Drink liquids between meals instead of during meals.  To prevent constipation: ? Eat foods that are high in fiber, like fresh fruits and vegetables, whole grains, and beans. ? Drink enough fluids to keep your pee (urine) clear or pale yellow. Activity  Exercise only as told by your doctor. Stop exercising if you have cramps or pain in your lower belly (abdomen) or low back.  Do not exercise if it is too hot, too humid, or if you are in a place of great height (high altitude).  Try to avoid standing for long periods of time. Move your legs often if you must stand in one place for a long time.  Avoid heavy lifting.  Wear low-heeled shoes. Sit and stand up  straight.  You can have sex unless your doctor tells you not to. Relieving pain and discomfort  Wear a good support bra if your breasts are sore.  Take warm water baths (sitz baths) to soothe pain or discomfort caused by hemorrhoids. Use hemorrhoid cream if your doctor says it is okay.  Rest with your legs raised if you have leg cramps or low back pain.  If you have puffy, bulging veins (varicose veins) in your legs: ? Wear support hose or compression stockings as told by your doctor. ? Raise (elevate) your feet for 15 minutes, 3-4 times a day. ? Limit salt in your food. Prenatal care  Schedule your prenatal visits by the twelfth week of pregnancy.  Write down your questions. Take them to your prenatal visits.  Keep all your prenatal visits as told by your doctor. This is important. Safety  Wear your seat belt at all times when driving.  Make a list of emergency phone numbers. The list should include numbers for family, friends, the hospital, and police and fire departments. General instructions  Ask your doctor for a referral to a local prenatal class. Begin classes no later than at the start of month 6 of your pregnancy.  Ask for help if you need counseling or if you need help with nutrition. Your doctor can give you advice or tell you where to go for help.  Do not use hot tubs, steam   rooms, or saunas.  Do not douche or use tampons or scented sanitary pads.  Do not cross your legs for long periods of time.  Avoid all herbs and alcohol. Avoid drugs that are not approved by your doctor.  Do not use any tobacco products, including cigarettes, chewing tobacco, and electronic cigarettes. If you need help quitting, ask your doctor. You may get counseling or other support to help you quit.  Avoid cat litter boxes and soil used by cats. These carry germs that can cause birth defects in the baby and can cause a loss of your baby (miscarriage) or stillbirth.  Visit your dentist.  At home, brush your teeth with a soft toothbrush. Be gentle when you floss. Contact a doctor if:  You are dizzy.  You have mild cramps or pressure in your lower belly.  You have a nagging pain in your belly area.  You continue to feel sick to your stomach, you throw up, or you have watery poop (diarrhea).  You have a bad smelling fluid coming from your vagina.  You have pain when you pee (urinate).  You have increased puffiness (swelling) in your face, hands, legs, or ankles. Get help right away if:  You have a fever.  You are leaking fluid from your vagina.  You have spotting or bleeding from your vagina.  You have very bad belly cramping or pain.  You gain or lose weight rapidly.  You throw up blood. It may look like coffee grounds.  You are around people who have German measles, fifth disease, or chickenpox.  You have a very bad headache.  You have shortness of breath.  You have any kind of trauma, such as from a fall or a car accident. Summary  The first trimester of pregnancy is from week 1 until the end of week 13 (months 1 through 3).  To take care of yourself and your unborn baby, you will need to eat healthy meals, take medicines only if your doctor tells you to do so, and do activities that are safe for you and your baby.  Keep all follow-up visits as told by your doctor. This is important as your doctor will have to ensure that your baby is healthy and growing well. This information is not intended to replace advice given to you by your health care provider. Make sure you discuss any questions you have with your health care provider. Document Revised: 09/14/2018 Document Reviewed: 06/01/2016 Elsevier Patient Education  2020 Elsevier Inc.  Safe Medications in Pregnancy   Acne: Benzoyl Peroxide Salicylic Acid  Backache/Headache: Tylenol: 2 regular strength every 4 hours OR              2 Extra strength every 6  hours  Colds/Coughs/Allergies: Benadryl (alcohol free) 25 mg every 6 hours as needed Breath right strips Claritin Cepacol throat lozenges Chloraseptic throat spray Cold-Eeze- up to three times per day Cough drops, alcohol free Flonase (by prescription only) Guaifenesin Mucinex Robitussin DM (plain only, alcohol free) Saline nasal spray/drops Sudafed (pseudoephedrine) & Actifed ** use only after [redacted] weeks gestation and if you do not have high blood pressure Tylenol Vicks Vaporub Zinc lozenges Zyrtec   Constipation: Colace Ducolax suppositories Fleet enema Glycerin suppositories Metamucil Milk of magnesia Miralax Senokot Smooth move tea  Diarrhea: Kaopectate Imodium A-D  *NO pepto Bismol  Hemorrhoids: Anusol Anusol HC Preparation H Tucks  Indigestion: Tums Maalox Mylanta Zantac  Pepcid  Insomnia: Benadryl (alcohol free) 25mg every 6 hours as needed   Tylenol PM Unisom, no Gelcaps  Leg Cramps: Tums MagGel  Nausea/Vomiting:  Bonine Dramamine Emetrol Ginger extract Sea bands Meclizine  Nausea medication to take during pregnancy:  Unisom (doxylamine succinate 25 mg tablets) Take one tablet daily at bedtime. If symptoms are not adequately controlled, the dose can be increased to a maximum recommended dose of two tablets daily (1/2 tablet in the morning, 1/2 tablet mid-afternoon and one at bedtime). Vitamin B6 100mg tablets. Take one tablet twice a day (up to 200 mg per day).  Skin Rashes: Aveeno products Benadryl cream or 25mg every 6 hours as needed Calamine Lotion 1% cortisone cream  Yeast infection: Gyne-lotrimin 7 Monistat 7   **If taking multiple medications, please check labels to avoid duplicating the same active ingredients **take medication as directed on the label ** Do not exceed 4000 mg of tylenol in 24 hours **Do not take medications that contain aspirin or ibuprofen   

## 2019-06-27 NOTE — Progress Notes (Signed)
History:   Robin Strickland is a 19 y.o. G2P1001 at [redacted]w[redacted]d by LMP c/w clinic viability scan performed today. She is being seen today for her first obstetrical visit.  Her obstetrical history is significant for maternal obesity and Gestational Hypertension. Patient does intend to breast feed. She reports breastfeeding her daughter. Pregnancy history fully reviewed.  Patient reports epigastric pain after certain meals. She endorses consistent pain when she consumes spicy food. Pain resolves with diet revision. She endorses craving cold fruit and ice water so this is not a common problem for her.  Patient lives with her husband and daughter. She previously worked for [employer] but reports that she decided to take off work for the first three months of her pregnancy. She is a non-smoker, denies SI, HI, IPV.  Patient was previously prescribed Phenergan for morning sickness but states she only took it 2-3 times and denies current difficulty with emesis.   HISTORY: OB History  Gravida Para Term Preterm AB Living  2 1 1  0 0 1  SAB TAB Ectopic Multiple Live Births  0 0 0 0 1    # Outcome Date GA Lbr Len/2nd Weight Sex Delivery Anes PTL Lv  2 Current           1 Term 06/17/16 [redacted]w[redacted]d 02:57 / 00:50 7 lb 4 oz (3.289 kg) F Vag-Spont EPI  LIV     Name: GEARLDEAN, MCCURTY     Apgar1: 8  Apgar5: 9    No pap smear history (age 79).  Past Medical History:  Diagnosis Date  . Gestational diabetes   . Medical history non-contributory    Past Surgical History:  Procedure Laterality Date  . NO PAST SURGERIES     Family History  Problem Relation Age of Onset  . Healthy Mother   . Healthy Father    Social History   Tobacco Use  . Smoking status: Never Smoker  . Smokeless tobacco: Never Used  Substance Use Topics  . Alcohol use: No  . Drug use: No   No Known Allergies Current Outpatient Medications on File Prior to Visit  Medication Sig Dispense Refill  . Prenatal Vit-Fe  Fumarate-FA (PREPLUS) 27-1 MG TABS Take 1 tablet by mouth daily. 30 tablet 13  . promethazine (PHENERGAN) 25 MG tablet Take 1 tablet (25 mg total) by mouth every 6 (six) hours as needed for nausea or vomiting. 30 tablet 2  . ibuprofen (ADVIL) 600 MG tablet TAKE 1 TABLET (600 MG TOTAL) BY MOUTH EVERY 8 (EIGHT) HOURS AS NEEDED. (Patient not taking: Reported on 06/06/2019) 60 tablet 1  . omeprazole (PRILOSEC) 20 MG capsule Take 1 capsule (20 mg total) by mouth daily. (Patient not taking: Reported on 06/06/2019) 30 capsule 3   No current facility-administered medications on file prior to visit.    Review of Systems Pertinent items noted in HPI and remainder of comprehensive ROS otherwise negative. Physical Exam:   Vitals:   06/27/19 1028  BP: 119/73  Pulse: 80  Weight: 165 lb (74.8 kg)   Fetal Heart Rate (bpm): 168 Uterus:     Pelvic Exam: Perineum: no hemorrhoids, normal perineum   Vulva: normal external genitalia, no lesions   Vagina:  normal mucosa, normal discharge   Cervix: no lesions and normal, pap smear done.    Adnexa: normal adnexa and no mass, fullness, tenderness   Bony Pelvis: average  System: General: well-developed, well-nourished female in no acute distress   Breasts:  normal appearance, no  masses or tenderness bilaterally   Skin: normal coloration and turgor, no rashes   Neurologic: oriented, normal, negative, normal mood   Extremities: normal strength, tone, and muscle mass, ROM of all joints is normal   HEENT PERRLA, extraocular movement intact and sclera clear, anicteric   Mouth/Teeth mucous membranes moist, pharynx normal without lesions and dental hygiene good   Neck supple and no masses   Cardiovascular: regular rate and rhythm   Respiratory:  no respiratory distress, normal breath sounds   Abdomen: soft, non-tender; bowel sounds normal; no masses,  no organomegaly  Bedside Ultrasound for FHR check: Patient informed that the ultrasound is considered a limited  obstetric ultrasound and is not intended to be a complete ultrasound exam.  Patient also informed that the ultrasound is not being completed with the intent of assessing for fetal or placental anomalies or any pelvic abnormalities.  Explained that the purpose of today's ultrasound is to assess for fetal heart rate.  Patient acknowledges the purpose of the exam and the limitations of the study.     Assessment:    Pregnancy: G2P1001 Patient Active Problem List   Diagnosis Date Noted  . Supervision of other normal pregnancy, antepartum 06/06/2019  . Migraine without aura and without status migrainosus, not intractable 10/28/2017  . History of gestational diabetes 04/19/2016   Indications for ASA therapy (per uptodate) One of the following: Previous pregnancy with preeclampsia, especially early onset and with an adverse outcome No Multifetal gestation No Chronic hypertension No Type 1 or 2 diabetes mellitus No Chronic kidney disease No Autoimmune disease (antiphospholipid syndrome, systemic lupus erythematosus) No  Two or more of the following: Nulliparity No Obesity (body mass index >30 kg/m2) Yes Family history of preeclampsia in mother or sister No Age ?35 years No Sociodemographic characteristics (African American race, low socioeconomic level) Yes Personal risk factors (eg, previous pregnancy with low birth weight or small for gestational age infant, previous adverse pregnancy outcome [eg, stillbirth], interval >10 years between pregnancies) No  Indications for early 1 hour GTT (per uptodate)  BMI >25 (>23 in Asian women) AND one of the following  Gestational diabetes mellitus in a previous pregnancy Yes  Plan:    1. Encounter for supervision of other normal pregnancy in first trimester - Continue routine care - Culture, OB Urine - GC/Chlamydia probe amp ([place])not at [facility] - Enroll Patient in Babyscripts - Babyscripts Schedule Optimization - [place] MFM OB COMP + 14 WK;  Future - Obstetric Panel, Including HIV; Future - Genetic Screening; Future - Hemoglobin A1c; Future - Protein / creatinine ratio, urine; Future - Comprehensive metabolic panel; Future - TSH; Future - Glucose tolerance, 1 hour; Future  2. History of gestational diabetes - Early GTT, baseline labs as above  3. Gastroesophageal reflux disease without esophagitis - Symptoms consistently resolve with diet modification. Continue PRN - May introduce medication management for worsening acuity  4. Obesity in pregnancy - BMI 30 today - Advised bASA 81 mg daily  - Discussed recommendations for weight management in pregnancy  Initial labs drawn. Continue prenatal vitamins. Genetic Screening discussed, First trimester screen, Quad screen and NIPS: requested. Ultrasound discussed; fetal anatomic survey: ordered. Problem list reviewed and updated. The nature of [place] with multiple MDs and other Advanced Practice Providers was explained to patient; also emphasized that residents, students are part of our team. Routine obstetric precautions reviewed. Return in about 2 weeks (around 07/11/2019) for Lab only and early GTT in two  weeks.   RTC at 16 werks, virtual if patient has received bp cuff   Mallie Snooks, MSN, CNM Certified Nurse Midwife, [employer] for [employer], [place] 06/27/19 12:45 PM

## 2019-06-27 NOTE — Progress Notes (Signed)
  DATING AND VIABILITY SONOGRAM   Shelsy Venetia Night Marshai Okuno is a 19 y.o. year old G2P1001 with LMP Patient's last menstrual period was 04/19/2019 (approximate). which would correlate to  [redacted]w[redacted]d weeks gestation.  She has irregular menstrual cycles.   She is here today for a confirmatory initial sonogram.    GESTATION: SINGLETON yes     FETAL ACTIVITY:          Heart rate     168          The fetus is active.    GESTATIONAL AGE AND  BIOMETRICS:  Gestational criteria: Estimated Date of Delivery: 01/24/20 by LMP now at [redacted]w[redacted]d  Previous Scans:0  GESTATIONAL SAC            mm         weeks  CROWN RUMP LENGTH          3.21 mm         10.0 weeks                                                   AVERAGE EGA(BY THIS SCAN):  10.0 weeks  WORKING EDD( LMP ):  01/24/2020     TECHNICIAN COMMENTS:  Patient informed that the ultrasound is considered a limited obstetric ultrasound and is not intended to be a complete ultrasound exam. Patient also informed that the ultrasound is not being completed with the intent of assessing for fetal or placental anomalies or any pelvic abnormalities. Explained that the purpose of today's ultrasound is to assess for fetal heart rate. Patient acknowledges the purpose of the exam and the limitations of the study.       A copy of this report including all images has been saved and backed up to a second source for retrieval if needed. All measures and details of the anatomical scan, placentation, fluid volume and pelvic anatomy are contained in that report.  Crosby Oyster 06/27/2019 10:49 AM

## 2019-06-28 LAB — GC/CHLAMYDIA PROBE AMP (~~LOC~~) NOT AT ARMC
Chlamydia: NEGATIVE
Comment: NEGATIVE
Comment: NORMAL
Neisseria Gonorrhea: NEGATIVE

## 2019-07-01 LAB — URINE CULTURE, OB REFLEX

## 2019-07-01 LAB — CULTURE, OB URINE

## 2019-07-02 ENCOUNTER — Other Ambulatory Visit: Payer: Self-pay | Admitting: Advanced Practice Midwife

## 2019-07-02 ENCOUNTER — Encounter (INDEPENDENT_AMBULATORY_CARE_PROVIDER_SITE_OTHER): Payer: Self-pay | Admitting: Primary Care

## 2019-07-02 DIAGNOSIS — O2340 Unspecified infection of urinary tract in pregnancy, unspecified trimester: Secondary | ICD-10-CM

## 2019-07-02 DIAGNOSIS — O2341 Unspecified infection of urinary tract in pregnancy, first trimester: Secondary | ICD-10-CM

## 2019-07-02 DIAGNOSIS — O0990 Supervision of high risk pregnancy, unspecified, unspecified trimester: Secondary | ICD-10-CM | POA: Diagnosis not present

## 2019-07-02 HISTORY — DX: Unspecified infection of urinary tract in pregnancy, unspecified trimester: O23.40

## 2019-07-02 MED ORDER — CEPHALEXIN 500 MG PO CAPS: 500.0000 mg | ORAL_CAPSULE | Freq: Four times a day (QID) | ORAL | 2 refills | Status: DC

## 2019-07-02 NOTE — Progress Notes (Signed)
E coli on urine culture collected at [facility]. Patient notified via active MyChart account.  Mallie Snooks, MSN, CNM Certified Nurse Midwife, [facility] for [employer], [place] 07/02/19 10:48 AM

## 2019-07-11 ENCOUNTER — Other Ambulatory Visit (HOSPITAL_COMMUNITY)
Admission: RE | Admit: 2019-07-11 | Discharge: 2019-07-11 | Disposition: A | Discharge status: Home / Self Care | Payer: Medicaid Other | Source: Ambulatory Visit | Attending: Advanced Practice Midwife | Admitting: Advanced Practice Midwife

## 2019-07-11 ENCOUNTER — Other Ambulatory Visit: Payer: Self-pay

## 2019-07-11 ENCOUNTER — Ambulatory Visit (INDEPENDENT_AMBULATORY_CARE_PROVIDER_SITE_OTHER): Payer: Medicaid Other | Admitting: Advanced Practice Midwife

## 2019-07-11 VITALS — BP 103/69 | HR 72 | Wt 165.2 lb

## 2019-07-11 DIAGNOSIS — N898 Other specified noninflammatory disorders of vagina: Secondary | ICD-10-CM | POA: Insufficient documentation

## 2019-07-11 DIAGNOSIS — Z3481 Encounter for supervision of other normal pregnancy, first trimester: Secondary | ICD-10-CM

## 2019-07-11 DIAGNOSIS — O26899 Other specified pregnancy related conditions, unspecified trimester: Secondary | ICD-10-CM

## 2019-07-11 DIAGNOSIS — R102 Other specified pregnancy related conditions, unspecified trimester: Secondary | ICD-10-CM

## 2019-07-11 DIAGNOSIS — Z3A11 11 weeks gestation of pregnancy: Secondary | ICD-10-CM

## 2019-07-11 DIAGNOSIS — O26891 Other specified pregnancy related conditions, first trimester: Secondary | ICD-10-CM

## 2019-07-11 DIAGNOSIS — Z3480 Encounter for supervision of other normal pregnancy, unspecified trimester: Secondary | ICD-10-CM

## 2019-07-11 NOTE — Progress Notes (Signed)
   PRENATAL VISIT NOTE  Subjective:  Robin Strickland is a 19 y.o. G2P1001 at [redacted]w[redacted]d being seen today for lab work and added to Provider schedule for investigation of multiple complaints as below.  She is currently monitored for the following issues for this low-risk pregnancy and has History of gestational diabetes; Migraine without aura and without status migrainosus, not intractable; Supervision of other normal pregnancy, antepartum; and UTI (urinary tract infection) during pregnancy on their problem list.  Patient reports recurrent suprapubic pain, new onset 07/02/2019, most recent episode two days ago. At its worst, her pain is 7/10 and resolves with time and rest. She denies aggravating or alleviating factors. She has not taken medication or tried other treatments for this complaint. She is s/p diagnosis of UTI and has "almost finished" her Keflex prescription. She also c/o abnormal vaginal discharge that is thick "grainy" and yellow-tinged. This is a new problem, onset 2-3 days ago.  Contractions: none. Denies vaginal bleeding, dysuria, abdominal tenderness, flank pain, fever.  Patient endorses "forcing myself" to have bowel movements daily. She reports sitting on the commode for prolonged periods of time, denies constipation. She endorses diet predominantly composed of carbs, prefers homemade food, broccoli is "only vegetable I like".  The following portions of the patient's history were reviewed and updated as appropriate: allergies, current medications, past family history, past medical history, past social history, past surgical history and problem list. Problem list updated.  Objective:   Vitals:   07/11/19 0829  BP: 103/69  Pulse: 72  Weight: 165 lb 3.2 oz (74.9 kg)    Fetal Status: Fetal Heart Rate (bpm): 125         General:  Alert, oriented and cooperative. Patient is in no acute distress.  Skin: Skin is warm and dry. No rash noted.   Cardiovascular: Normal heart  rate noted  Respiratory: Normal respiratory effort, no problems with respiration noted  Abdomen: Soft, gravid, appropriate for gestational age. Non-tender, bowel sounds   Pain/Pressure: Absent     Pelvic: Cervical exam deferred      No abnormal discharge present today. Picture brought by patient demonstrates discharge WNL  Extremities: Normal range of motion.     Mental Status: Normal mood and affect. Normal behavior. Normal judgment and thought content.   Assessment and Plan:  Pregnancy: G2P1001 at [redacted]w[redacted]d  1. Encounter for supervision of other normal pregnancy in first trimester - Continue routine care, present for New OB lab work  - Reviewed diet recommendations for well-rounded nutrition in pregnancy - Start daily bASA for PEC prophylaxis tomorrow at [redacted] weeks GA - Glucose tolerance, 1 hour - TSH - Comprehensive metabolic panel - Protein / creatinine ratio, urine - Hemoglobin A1c - Obstetric Panel, Including HIV  2. Pelvic pain affecting pregnancy, antepartum - Urine TOC next visit - Avoid prolonged periods on commode/straining - Cervicovaginal ancillary only  3. Vaginal discharge during pregnancy, antepartum - Cervicovaginal ancillary only  Preterm labor symptoms and general obstetric precautions including but not limited to vaginal bleeding, contractions, leaking of fluid and fetal movement were reviewed in detail with the patient. Please refer to After Visit Summary for other counseling recommendations.    Future Appointments  Date Time Provider [place]  08/06/2019  1:15 PM Aletha Halim, MD [facility]    Darlina Rumpf, [place]

## 2019-07-11 NOTE — Patient Instructions (Signed)

## 2019-07-11 NOTE — Progress Notes (Signed)
Patient reports abdominal pain x 2 days.

## 2019-07-12 ENCOUNTER — Encounter (INDEPENDENT_AMBULATORY_CARE_PROVIDER_SITE_OTHER): Payer: Self-pay | Admitting: Primary Care

## 2019-07-12 LAB — CERVICOVAGINAL ANCILLARY ONLY
Bacterial Vaginitis (gardnerella): NEGATIVE
Candida Glabrata: NEGATIVE
Candida Vaginitis: NEGATIVE
Chlamydia: NEGATIVE
Comment: NEGATIVE
Comment: NEGATIVE
Comment: NEGATIVE
Comment: NEGATIVE
Comment: NEGATIVE
Comment: NORMAL
Neisseria Gonorrhea: NEGATIVE
Trichomonas: NEGATIVE

## 2019-07-12 LAB — COMPREHENSIVE METABOLIC PANEL
ALT: 27 IU/L (ref 0–32)
AST: 19 IU/L (ref 0–40)
Albumin/Globulin Ratio: 1.3 (ref 1.2–2.2)
Albumin: 4 g/dL (ref 3.9–5.0)
Alkaline Phosphatase: 79 IU/L (ref 39–117)
BUN/Creatinine Ratio: 11 (ref 9–23)
BUN: 6 mg/dL (ref 6–20)
Bilirubin Total: 0.4 mg/dL (ref 0.0–1.2)
CO2: 20 mmol/L (ref 20–29)
Calcium: 9.1 mg/dL (ref 8.7–10.2)
Chloride: 101 mmol/L (ref 96–106)
Creatinine, Ser: 0.57 mg/dL (ref 0.57–1.00)
GFR calc Af Amer: 155 mL/min/{1.73_m2} (ref >59)
GFR calc non Af Amer: 135 mL/min/{1.73_m2} (ref >59)
Globulin, Total: 3.2 g/dL (ref 1.5–4.5)
Glucose: 178 mg/dL — ABNORMAL HIGH (ref 65–99)
Potassium: 3.9 mmol/L (ref 3.5–5.2)
Sodium: 137 mmol/L (ref 134–144)
Total Protein: 7.2 g/dL (ref 6.0–8.5)

## 2019-07-12 LAB — OBSTETRIC PANEL, INCLUDING HIV
Antibody Screen: NEGATIVE
Basophils Absolute: 0 10*3/uL (ref 0.0–0.2)
Basos: 0 %
EOS (ABSOLUTE): 0.1 10*3/uL (ref 0.0–0.4)
Eos: 1 %
HIV Screen 4th Generation wRfx: NONREACTIVE
Hematocrit: 37.2 % (ref 34.0–46.6)
Hemoglobin: 12.3 g/dL (ref 11.1–15.9)
Hepatitis B Surface Ag: NEGATIVE
Immature Grans (Abs): 0.1 10*3/uL (ref 0.0–0.1)
Immature Granulocytes: 1 %
Lymphocytes Absolute: 2.5 10*3/uL (ref 0.7–3.1)
Lymphs: 24 %
MCH: 30.1 pg (ref 26.6–33.0)
MCHC: 33.1 g/dL (ref 31.5–35.7)
MCV: 91 fL (ref 79–97)
Monocytes Absolute: 0.6 10*3/uL (ref 0.1–0.9)
Monocytes: 6 %
Neutrophils Absolute: 7.1 10*3/uL — ABNORMAL HIGH (ref 1.4–7.0)
Neutrophils: 68 %
Platelets: 343 10*3/uL (ref 150–450)
RBC: 4.09 x10E6/uL (ref 3.77–5.28)
RDW: 12.5 % (ref 11.7–15.4)
RPR Ser Ql: NONREACTIVE
Rh Factor: POSITIVE
Rubella Antibodies, IGG: 1.43 index (ref >0.99)
WBC: 10.4 10*3/uL (ref 3.4–10.8)

## 2019-07-12 LAB — PROTEIN / CREATININE RATIO, URINE
Creatinine, Urine: 283.1 mg/dL
Protein, Ur: 26.4 mg/dL
Protein/Creat Ratio: 93 mg/g creat (ref 0–200)

## 2019-07-12 LAB — HEMOGLOBIN A1C
Est. average glucose Bld gHb Est-mCnc: 105 mg/dL
Hgb A1c MFr Bld: 5.3 % (ref 4.8–5.6)

## 2019-07-12 LAB — TSH: TSH: 0.586 u[IU]/mL (ref 0.450–4.500)

## 2019-07-12 LAB — GLUCOSE TOLERANCE, 1 HOUR: Glucose, 1Hr PP: 180 mg/dL (ref 65–199)

## 2019-07-18 ENCOUNTER — Encounter: Payer: Self-pay | Admitting: Radiology

## 2019-07-18 ENCOUNTER — Telehealth: Payer: Self-pay | Admitting: Radiology

## 2019-07-18 NOTE — Telephone Encounter (Signed)
Patient informed of panorama results

## 2019-07-31 ENCOUNTER — Encounter: Payer: Self-pay | Admitting: Radiology

## 2019-08-06 ENCOUNTER — Other Ambulatory Visit: Payer: Self-pay

## 2019-08-06 ENCOUNTER — Encounter: Payer: Self-pay | Admitting: Obstetrics and Gynecology

## 2019-08-06 ENCOUNTER — Telehealth (INDEPENDENT_AMBULATORY_CARE_PROVIDER_SITE_OTHER): Payer: Medicaid Other | Admitting: Obstetrics and Gynecology

## 2019-08-06 DIAGNOSIS — Z8632 Personal history of gestational diabetes: Secondary | ICD-10-CM

## 2019-08-06 DIAGNOSIS — Z3480 Encounter for supervision of other normal pregnancy, unspecified trimester: Secondary | ICD-10-CM

## 2019-08-06 DIAGNOSIS — Z3A15 15 weeks gestation of pregnancy: Secondary | ICD-10-CM | POA: Diagnosis not present

## 2019-08-06 DIAGNOSIS — O2342 Unspecified infection of urinary tract in pregnancy, second trimester: Secondary | ICD-10-CM

## 2019-08-06 NOTE — Progress Notes (Signed)
Patient reports not taking blood pressure or weight in a couple of weeks.

## 2019-08-06 NOTE — Progress Notes (Signed)
   TELEHEALTH VIRTUAL OBSTETRICS VISIT ENCOUNTER NOTE  Clinic: [facility]  I connected with Robin Strickland on 08/06/19 at  1:15 PM EST by telephone at home and verified that I am speaking with the correct person using two identifiers.   I discussed the limitations, risks, security and privacy concerns of performing an evaluation and management service by telephone and the availability of in person appointments. I also discussed with the patient that there may be a patient responsible charge related to this service. The patient expressed understanding and agreed to proceed.  Subjective:  Robin Strickland is a 19 y.o. G2P1001 at [redacted]w[redacted]d being followed for ongoing prenatal care.  She is currently monitored for the following issues for this low-risk pregnancy and has Migraine without aura and without status migrainosus, not intractable; Supervision of other normal pregnancy, antepartum; and UTI (urinary tract infection) during pregnancy on their problem list.  Patient reports b/l LE symmetric edema. Reports fetal movement. Denies any contractions, bleeding or leaking of fluid.   The following portions of the patient's history were reviewed and updated as appropriate: allergies, current medications, past family history, past medical history, past social history, past surgical history and problem list.   Objective:  There were no vitals filed for this visit.  Babyscripts Data Reviewed: not applicable  General:  Alert, oriented and cooperative.   Mental Status: Normal mood and affect perceived. Normal judgment and thought content.  Rest of physical exam deferred due to type of encounter  Assessment and Plan:  Pregnancy: G2P1001 at [redacted]w[redacted]d 1. Urinary tract infection in mother during second trimester of pregnancy Pt to come in and leave toc sample and to check weight and bp (pt not checking). Also told her she can do afp if she desires - Culture,  OB Urine; Future  2. History of gestational diabetes Early a1c neg  3. Supervision of other normal pregnancy, antepartum Anatomy u/s already scheduled Recommended feet elevation, TED/compression hoses  Preterm labor symptoms and general obstetric precautions including but not limited to vaginal bleeding, contractions, leaking of fluid and fetal movement were reviewed in detail with the patient.  I discussed the assessment and treatment plan with the patient. The patient was provided an opportunity to ask questions and all were answered. The patient agreed with the plan and demonstrated an understanding of the instructions. The patient was advised to call back or seek an in-person office evaluation/go to [facility] at [facility] for any urgent or concerning symptoms. Please refer to After Visit Summary for other counseling recommendations.   I provided 7 minutes of non-face-to-face time during this encounter. The visit was conducted via MyChart-medicine  Return in about 1 month (around 09/06/2019) for low risk, virtual visit.  Future Appointments  Date Time Provider Department Center  09/03/2019  2:30 PM Donnamae Jude, MD [facility]  09/14/2019  1:30 PM [place], [place] for [facility], [place]

## 2019-09-03 ENCOUNTER — Telehealth: Payer: Medicaid Other | Admitting: Family Medicine

## 2019-09-14 ENCOUNTER — Other Ambulatory Visit: Payer: Self-pay

## 2019-09-14 ENCOUNTER — Ambulatory Visit (HOSPITAL_COMMUNITY)
Admission: RE | Admit: 2019-09-14 | Discharge: 2019-09-14 | Disposition: A | Discharge status: Home / Self Care | Payer: Medicaid Other | Source: Ambulatory Visit | Attending: Obstetrics and Gynecology | Admitting: Obstetrics and Gynecology

## 2019-09-14 DIAGNOSIS — O09292 Supervision of pregnancy with other poor reproductive or obstetric history, second trimester: Secondary | ICD-10-CM | POA: Diagnosis not present

## 2019-09-14 DIAGNOSIS — Z3A21 21 weeks gestation of pregnancy: Secondary | ICD-10-CM

## 2019-09-14 DIAGNOSIS — Z363 Encounter for antenatal screening for malformations: Secondary | ICD-10-CM | POA: Diagnosis not present

## 2019-09-14 DIAGNOSIS — Z3481 Encounter for supervision of other normal pregnancy, first trimester: Secondary | ICD-10-CM

## 2019-09-17 ENCOUNTER — Telehealth (INDEPENDENT_AMBULATORY_CARE_PROVIDER_SITE_OTHER): Payer: Medicaid Other | Admitting: Obstetrics and Gynecology

## 2019-09-17 ENCOUNTER — Other Ambulatory Visit: Payer: Self-pay

## 2019-09-17 VITALS — BP 116/61 | HR 104 | Wt 166.0 lb

## 2019-09-17 DIAGNOSIS — O2342 Unspecified infection of urinary tract in pregnancy, second trimester: Secondary | ICD-10-CM

## 2019-09-17 DIAGNOSIS — Z3A21 21 weeks gestation of pregnancy: Secondary | ICD-10-CM

## 2019-09-17 DIAGNOSIS — O99810 Abnormal glucose complicating pregnancy: Secondary | ICD-10-CM

## 2019-09-17 DIAGNOSIS — Z3480 Encounter for supervision of other normal pregnancy, unspecified trimester: Secondary | ICD-10-CM

## 2019-09-17 NOTE — Progress Notes (Signed)
TELEHEALTH VIRTUAL OBSTETRICS VISIT ENCOUNTER NOTE  Clinic: Center for [facility]  I connected with Tifani Tuey on 09/17/19 at  3:30 PM EDT by telephone at home and verified that I am speaking with the correct person using two identifiers.   I discussed the limitations, risks, security and privacy concerns of performing an evaluation and management service by telephone and the availability of in person appointments. I also discussed with the patient that there may be a patient responsible charge related to this service. The patient expressed understanding and agreed to proceed.  Subjective:  Robin Strickland is a 19 y.o. G2P1001 at [redacted]w[redacted]d being followed for ongoing prenatal care.  She is currently monitored for the following issues for this low-risk pregnancy and has Migraine without aura and without status migrainosus, not intractable; Supervision of other normal pregnancy, antepartum; UTI (urinary tract infection) during pregnancy; and Abnormal glucose affecting pregnancy on their problem list.  Patient reports some occasional vaginal pressure. Reports fetal movement. Denies any contractions, bleeding or leaking of fluid.   The following portions of the patient's history were reviewed and updated as appropriate: allergies, current medications, past family history, past medical history, past social history, past surgical history and problem list.   Objective:   Vitals:   09/17/19 1326  BP: 116/61  Pulse: (!) 104  Weight: 166 lb (75.3 kg)    Babyscripts Data Reviewed: yes  General:  Alert, oriented and cooperative.   Mental Status: Normal mood and affect perceived. Normal judgment and thought content.  Rest of physical exam deferred due to type of encounter  Assessment and Plan:  Pregnancy: G2P1001 at [redacted]w[redacted]d 1. Urinary tract infection in mother during second trimester of pregnancy Pt to come in this week to leave toc  2.  Supervision of other normal pregnancy, antepartum Normal anatomy u/s and cervical length a few days ago  3. Abnormal glucose affecting pregnancy I was talking to her about her sugar test being not at her nv but the one after that and she was wondering how come she has to do it again. I told her that since she had GDM in her last pregnancy they checked it early.  I looked at the number and although the a1c was normal at 5.3, her 1h GTT was 180; I'm not sure why she didn't get a formal 3h or 2h GTT.  I told her I recommend doing a 2 hour fasting GTT this week, which she is amenable to  Preterm labor symptoms and general obstetric precautions including but not limited to vaginal bleeding, contractions, leaking of fluid and fetal movement were reviewed in detail with the patient.  I discussed the assessment and treatment plan with the patient. The patient was provided an opportunity to ask questions and all were answered. The patient agreed with the plan and demonstrated an understanding of the instructions. The patient was advised to call back or seek an in-person office evaluation/go to MAU at [facility] for any urgent or concerning symptoms. Please refer to After Visit Summary for other counseling recommendations.   I provided 10 minutes of non-face-to-face time during this encounter. The visit was conducted via MyChart-medicine  Return in about 1 month (around 10/17/2019).  Future Appointments  Date Time Provider [place]  09/17/2019  3:30 PM Aletha Halim, MD [facility]  10/15/2019  4:00 PM Anyanwu, Sallyanne Havers, MD [facility]    Aletha Halim, [place] [facility]

## 2019-09-17 NOTE — Progress Notes (Signed)
I connected with  Janne Lab on 09/17/19 at  3:30 PM EDT by telephone and verified that I am speaking with the correct person using two identifiers.   I discussed the limitations, risks, security and privacy concerns of performing an evaluation and management service by telephone and the availability of in person appointments. I also discussed with the patient that there may be a patient responsible charge related to this service. The patient expressed understanding and agreed to proceed.  [place], CMA 09/17/2019  1:30 PM

## 2019-09-18 ENCOUNTER — Other Ambulatory Visit: Payer: Medicaid Other

## 2019-09-18 ENCOUNTER — Other Ambulatory Visit: Payer: Self-pay

## 2019-09-18 NOTE — Progress Notes (Signed)
Pt had to leave due to husband being in a car accident, will come back another day to redo GTT. Orders cancelled for today.

## 2019-09-19 ENCOUNTER — Other Ambulatory Visit: Payer: Medicaid Other

## 2019-09-27 ENCOUNTER — Other Ambulatory Visit: Payer: Medicaid Other

## 2019-10-08 ENCOUNTER — Inpatient Hospital Stay (HOSPITAL_COMMUNITY)
Admission: AD | Admit: 2019-10-08 | Discharge: 2019-10-08 | Disposition: A | Discharge status: Home / Self Care | Payer: Medicaid Other | Attending: Obstetrics and Gynecology | Admitting: Obstetrics and Gynecology

## 2019-10-08 ENCOUNTER — Other Ambulatory Visit: Payer: Self-pay

## 2019-10-08 ENCOUNTER — Encounter (HOSPITAL_COMMUNITY): Payer: Self-pay | Admitting: Obstetrics and Gynecology

## 2019-10-08 DIAGNOSIS — O26899 Other specified pregnancy related conditions, unspecified trimester: Secondary | ICD-10-CM

## 2019-10-08 DIAGNOSIS — Z7982 Long term (current) use of aspirin: Secondary | ICD-10-CM | POA: Diagnosis not present

## 2019-10-08 DIAGNOSIS — R102 Pelvic and perineal pain: Secondary | ICD-10-CM | POA: Diagnosis not present

## 2019-10-08 DIAGNOSIS — Z3A24 24 weeks gestation of pregnancy: Secondary | ICD-10-CM | POA: Diagnosis not present

## 2019-10-08 DIAGNOSIS — O368120 Decreased fetal movements, second trimester, not applicable or unspecified: Secondary | ICD-10-CM | POA: Diagnosis not present

## 2019-10-08 DIAGNOSIS — Z79899 Other long term (current) drug therapy: Secondary | ICD-10-CM | POA: Diagnosis not present

## 2019-10-08 DIAGNOSIS — O26892 Other specified pregnancy related conditions, second trimester: Secondary | ICD-10-CM | POA: Insufficient documentation

## 2019-10-08 LAB — URINALYSIS, ROUTINE W REFLEX MICROSCOPIC
Bilirubin Urine: NEGATIVE
Glucose, UA: NEGATIVE mg/dL
Hgb urine dipstick: NEGATIVE
Ketones, ur: NEGATIVE mg/dL
Nitrite: NEGATIVE
Protein, ur: NEGATIVE mg/dL
Specific Gravity, Urine: 1.023 (ref 1.005–1.030)
pH: 6 (ref 5.0–8.0)

## 2019-10-08 LAB — WET PREP, GENITAL
Clue Cells Wet Prep HPF POC: NONE SEEN
Sperm: NONE SEEN
Trich, Wet Prep: NONE SEEN
Yeast Wet Prep HPF POC: NONE SEEN

## 2019-10-08 MED ORDER — COMFORT FIT MATERNITY SUPP SM MISC: 1.0000 [IU] | Freq: Every day | 0 refills | Status: DC | PRN

## 2019-10-08 NOTE — MAU Note (Signed)
Robin Strickland is a 19 y.o. at [redacted]w[redacted]d here in MAU reporting: always had pelvic pain and pressure but recently it has gotten worse. Having trouble walking and getting out of bed. Last night the pain was so bad that she was vomiting. Has not felt any FM since yesterday. Yesterday when she wiped she saw pink but has not seen any today. No LOF.  Onset of complaint: yesterday  Pain score: 9/10  Vitals:   10/08/19 0737  BP: 115/73  Pulse: 95  Resp: 16  Temp: 98.2 F (36.8 C)  SpO2: 99%     FHT: 147  Lab orders placed from triage: UA

## 2019-10-08 NOTE — Discharge Instructions (Signed)
   PREGNANCY SUPPORT BELT: °You are not alone, Seventy-five percent of women have some sort of abdominal or back pain at some point in their pregnancy. Your baby is growing at a fast pace, which means that your whole body is rapidly trying to adjust to the changes. As your uterus grows, your back may start feeling a bit under stress and this can result in back or abdominal pain that can go from mild, and therefore bearable, to severe pains that will not allow you to sit or lay down comfortably, When it comes to dealing with pregnancy-related pains and cramps, some pregnant women usually prefer natural remedies, which the market is filled with nowadays. For example, wearing a pregnancy support belt can help ease and lessen your discomfort and pain. °WHAT ARE THE BENEFITS OF WEARING A PREGNANCY SUPPORT BELT? A pregnancy support belt provides support to the lower portion of the belly taking some of the weight of the growing uterus and distributing to the other parts of your body. It is designed make you comfortable and gives you extra support. Over the years, the pregnancy apparel market has been studying the needs and wants of pregnant women and they have come up with the most comfortable pregnancy support belts that woman could ever ask for. In fact, you will no longer have to wear a stretched-out or bulky pregnancy belt that is visible underneath your clothes and makes you feel even more uncomfortable. Nowadays, a pregnancy support belt is made of comfortable and stretchy materials that will not irritate your skin but will actually make you feel at ease and you will not even notice you are wearing it. They are easy to put on and adjust during the day and can be worn at night for additional support.  °BENEFITS: °• Relives Back pain °• Relieves Abdominal Muscle and Leg Pain °• Stabilizes the Pelvic Ring °• Offers a Cushioned Abdominal Lift Pad °• Relieves pressure on the Sciatic Nerve Within Minutes °WHERE TO GET  YOUR PREGNANCY BELT: [employer] [phone] @[address] [place], [place] [address] ° ° °

## 2019-10-08 NOTE — MAU Provider Note (Signed)
Chief Complaint:  Pelvic Pain and Decreased Fetal Movement   First Provider Initiated Contact with Patient 10/08/19 0818     HPI: Robin Strickland is a 19 y.o. G2P1001 at 25w4dwho presents to maternity admissions reporting pelvic pain & decreased fetal movement. Reports ongoing pelvic pain for the last several weeks that has worsened since yesterday. Pain is worse with walking & moving. Has a maternity support belt but doesn't wear it regularly because it is uncomfortable. Vomited once last night. Denies continued nausea. Denies dysuria, vaginal bleeding, or LOF. Some increase in foul smelling discharge but not itching or irritation. Had intercourse last night.  Reports no fetal movement for the last few days.  Location: pelvic Quality: sharp Severity: 9/10 in pain scale Duration: 2 weeks Timing: intermittent Modifying factors: worse with walking and moving Associated signs and symptoms: none  Pregnancy Course: [facility]. Missed last appointment. No complications with current pregnancy  Past Medical History:  Diagnosis Date  . Gestational diabetes   . History of gestational diabetes 04/19/2016   2021: early a1c negative.    OB History  Gravida Para Term Preterm AB Living  '2 1 1     1  '$ SAB TAB Ectopic Multiple Live Births        0 1    # Outcome Date GA Lbr Len/2nd Weight Sex Delivery Anes PTL Lv  2 Current           1 Term 06/17/16 350w5d2:57 / 00:50 3289 g F Vag-Spont EPI  LIV   Past Surgical History:  Procedure Laterality Date  . NO PAST SURGERIES     Family History  Problem Relation Age of Onset  . Healthy Mother   . Healthy Father    Social History   Tobacco Use  . Smoking status: Never Smoker  . Smokeless tobacco: Never Used  Substance Use Topics  . Alcohol use: No  . Drug use: No   No Known Allergies Medications Prior to Admission  Medication Sig Dispense Refill Last Dose  . diphenhydrAMINE (BENADRYL) 25 MG tablet Take 25 mg by mouth every 6  (six) hours as needed.   Past Week at 10/06/19  . Prenatal Vit-Fe Fumarate-FA (PREPLUS) 27-1 MG TABS Take 1 tablet by mouth daily. 30 tablet 13 10/07/2019 at Unknown time  . aspirin EC 81 MG tablet Take 1 tablet (81 mg total) by mouth daily. Take after 12 weeks for prevention of preeclampsia later in pregnancy (Patient not taking: Reported on 08/06/2019) 300 tablet 2   . Blood Pressure KIT 1 Device by Does not apply route once a week. To be monitored weekly from home (Patient not taking: Reported on 09/17/2019) 1 kit 0   . cephALEXin (KEFLEX) 500 MG capsule Take 1 capsule (500 mg total) by mouth 4 (four) times daily. (Patient not taking: Reported on 07/11/2019) 28 capsule 2   . ibuprofen (ADVIL) 600 MG tablet TAKE 1 TABLET (600 MG TOTAL) BY MOUTH EVERY 8 (EIGHT) HOURS AS NEEDED. (Patient not taking: Reported on 06/06/2019) 60 tablet 1   . omeprazole (PRILOSEC) 20 MG capsule Take 1 capsule (20 mg total) by mouth daily. (Patient not taking: Reported on 06/06/2019) 30 capsule 3   . promethazine (PHENERGAN) 25 MG tablet Take 1 tablet (25 mg total) by mouth every 6 (six) hours as needed for nausea or vomiting. (Patient not taking: Reported on 07/11/2019) 30 tablet 2 More than a month at Unknown time    I have reviewed patient's Past Medical  Hx, Surgical Hx, Family Hx, Social Hx, medications and allergies.   ROS:  Review of Systems  Constitutional: Negative.   Gastrointestinal: Negative.   Genitourinary: Positive for pelvic pain and vaginal discharge. Negative for dysuria and vaginal bleeding.    Physical Exam   Patient Vitals for the past 24 hrs:  BP Temp Temp src Pulse Resp SpO2 Height Weight  10/08/19 0750 122/62 98.8 F (37.1 C) Oral 96 15 99 % -- --  10/08/19 0737 115/73 98.2 F (36.8 C) Oral 95 16 99 % -- --  10/08/19 0731 -- -- -- -- -- -- '5\' 1"'$  (1.549 m) 78 kg    Constitutional: Well-developed, well-nourished female in no acute distress.  Cardiovascular: normal rate & rhythm, no  murmur Respiratory: normal effort, lung sounds clear throughout GI: Abd soft, non-tender, gravid appropriate for gestational age. Pos BS x 4 MS: Extremities nontender, no edema, normal ROM Neurologic: Alert and oriented x 4.  GU: NEFG, no blood  Dilation: Closed Effacement (%): Thick Cervical Position: Posterior Exam by:: Jorje Guild NP  NST:  Baseline: 145 bpm, Variability: Good {> 6 bpm), Accelerations: Non-reactive but appropriate for gestational age and Decelerations: Absent   Labs: Results for orders placed or performed during the hospital encounter of 10/08/19 (from the past 24 hour(s))  Urinalysis, Routine w reflex microscopic     Status: Abnormal   Collection Time: 10/08/19  8:28 AM  Result Value Ref Range   Color, Urine YELLOW YELLOW   APPearance HAZY (A) CLEAR   Specific Gravity, Urine 1.023 1.005 - 1.030   pH 6.0 5.0 - 8.0   Glucose, UA NEGATIVE NEGATIVE mg/dL   Hgb urine dipstick NEGATIVE NEGATIVE   Bilirubin Urine NEGATIVE NEGATIVE   Ketones, ur NEGATIVE NEGATIVE mg/dL   Protein, ur NEGATIVE NEGATIVE mg/dL   Nitrite NEGATIVE NEGATIVE   Leukocytes,Ua TRACE (A) NEGATIVE   RBC / HPF 0-5 0 - 5 RBC/hpf   WBC, UA 0-5 0 - 5 WBC/hpf   Bacteria, UA RARE (A) NONE SEEN   Squamous Epithelial / LPF 0-5 0 - 5   Mucus PRESENT   Wet prep, genital     Status: Abnormal   Collection Time: 10/08/19  8:35 AM  Result Value Ref Range   Yeast Wet Prep HPF POC NONE SEEN NONE SEEN   Trich, Wet Prep NONE SEEN NONE SEEN   Clue Cells Wet Prep HPF POC NONE SEEN NONE SEEN   WBC, Wet Prep HPF POC FEW (A) NONE SEEN   Sperm NONE SEEN     Imaging:  No results found.  MAU Course: Orders Placed This Encounter  Procedures  . Wet prep, genital  . Culture, OB Urine  . Urinalysis, Routine w reflex microscopic  . Discharge patient   Meds ordered this encounter  Medications  . Elastic Bandages & Supports (COMFORT FIT MATERNITY SUPP SM) MISC    Sig: 1 Units by Does not apply route daily  as needed.    Dispense:  1 each    Refill:  0    Order Specific Question:   Supervising Provider    Answer:   Arlina Robes L [[id number]]    MDM: Fetal tracing appropriate for gestation. Patient reports active fetus while here in MAU.  No contractions on TOCO. Cervix closed/thick.   Wet prep & GC/CT collected - negative wet prep, GC/CT pending  Patient requesting new maternity support belt - will give printed prescription for patient to go to [employer].   U/a with  trace leuks & bacteria. Patient denies urinary complaints. Needs urine culture for UTI TOC -- will send today's urine for culture.   Assessment: 1. Pelvic pain affecting pregnancy in second trimester, antepartum   2. [redacted] weeks gestation of pregnancy   3. Pain of round ligament during pregnancy   4. Decreased fetal movements in second trimester, single or unspecified fetus     Plan: Discharge home in stable condition.  Rx maternity support belt GC/CT & urine culture pending Discussed reasons to return to [place]. Follow up.   Why: for maternity support belt Contact information: [address] [address] [phone]           Allergies as of 10/08/2019   No Known Allergies     Medication List    STOP taking these medications   cephALEXin 500 MG capsule Commonly known as: KEFLEX   ibuprofen 600 MG tablet Commonly known as: ADVIL   omeprazole 20 MG capsule Commonly known as: PRILOSEC   promethazine 25 MG tablet Commonly known as: PHENERGAN     TAKE these medications   aspirin EC 81 MG tablet Take 1 tablet (81 mg total) by mouth daily. Take after 12 weeks for prevention of preeclampsia later in pregnancy   Blood Pressure Kit 1 Device by Does not apply route once a week. To be monitored weekly from home   [place] 1 Units by Does not apply route daily as needed.   diphenhydrAMINE 25 MG  tablet Commonly known as: BENADRYL Take 25 mg by mouth every 6 (six) hours as needed.   PrePLUS 27-1 MG Tabs Take 1 tablet by mouth daily.       Jorje Guild, NP 10/08/2019 9:14 AM

## 2019-10-09 LAB — GC/CHLAMYDIA PROBE AMP (~~LOC~~) NOT AT ARMC
Chlamydia: NEGATIVE
Comment: NEGATIVE
Comment: NORMAL
Neisseria Gonorrhea: NEGATIVE

## 2019-10-09 LAB — CULTURE, OB URINE: Culture: 10000 — AB

## 2019-10-12 ENCOUNTER — Other Ambulatory Visit: Payer: Medicaid Other

## 2019-10-12 ENCOUNTER — Other Ambulatory Visit: Payer: Self-pay

## 2019-10-12 DIAGNOSIS — Z3480 Encounter for supervision of other normal pregnancy, unspecified trimester: Secondary | ICD-10-CM

## 2019-10-12 LAB — CBC
Hematocrit: 35.1 % (ref 34.0–46.6)
Hemoglobin: 11.6 g/dL (ref 11.1–15.9)
MCH: 30.5 pg (ref 26.6–33.0)
MCHC: 33 g/dL (ref 31.5–35.7)
MCV: 92 fL (ref 79–97)
Platelets: 362 10*3/uL (ref 150–450)
RBC: 3.8 x10E6/uL (ref 3.77–5.28)
RDW: 12.4 % (ref 11.7–15.4)
WBC: 13 10*3/uL — ABNORMAL HIGH (ref 3.4–10.8)

## 2019-10-13 LAB — GLUCOSE TOLERANCE, 2 HOURS W/ 1HR
Glucose, 1 hour: 168 mg/dL (ref 65–179)
Glucose, 2 hour: 140 mg/dL (ref 65–152)
Glucose, Fasting: 86 mg/dL (ref 65–91)

## 2019-10-13 LAB — RPR: RPR Ser Ql: NONREACTIVE

## 2019-10-13 LAB — HIV ANTIBODY (ROUTINE TESTING W REFLEX): HIV Screen 4th Generation wRfx: NONREACTIVE

## 2019-10-15 ENCOUNTER — Telehealth (INDEPENDENT_AMBULATORY_CARE_PROVIDER_SITE_OTHER): Payer: Medicaid Other | Admitting: Obstetrics & Gynecology

## 2019-10-15 ENCOUNTER — Other Ambulatory Visit: Payer: Self-pay

## 2019-10-15 DIAGNOSIS — Z3480 Encounter for supervision of other normal pregnancy, unspecified trimester: Secondary | ICD-10-CM

## 2019-10-15 NOTE — Patient Instructions (Addendum)
Return to office for any scheduled appointments. Call the office or go to the MAU at [facility] if:  You begin to have strong, frequent contractions  Your water breaks.  Sometimes it is a big gush of fluid, sometimes it is just a trickle that keeps getting your panties wet or running down your legs  You have vaginal bleeding.  It is normal to have a small amount of spotting if your cervix was checked.   You do not feel your baby moving like normal.  If you do not, get something to eat and drink and lay down and focus on feeling your baby move.   If your baby is still not moving like normal, you should call the office or go to MAU.  Any other obstetric concerns.  TDaP Vaccine Pregnancy Get the Whooping Cough Vaccine While You Are Pregnant (CDC)  It is important for women to get the whooping cough vaccine in the third trimester of each pregnancy. Vaccines are the best way to prevent this disease. There are 2 different whooping cough vaccines. Both vaccines combine protection against whooping cough, tetanus and diphtheria, but they are for different age groups: Tdap: for everyone 11 years or older, including pregnant women  DTaP: for children 2 months through 6 years of age  You need the whooping cough vaccine during each of your pregnancies The recommended time to get the shot is during your 27th through 36th week of pregnancy, preferably during the earlier part of this time period. The Centers for Disease Control and Prevention (CDC) recommends that pregnant women receive the whooping cough vaccine for adolescents and adults (called Tdap vaccine) during the third trimester of each pregnancy. The recommended time to get the shot is during your 27th through 36th week of pregnancy, preferably during the earlier part of this time period. This replaces the original recommendation that pregnant women get the vaccine only if they had not previously received it. The  American College of Obstetricians and Gynecologists and the American College of Nurse-Midwives support this recommendation.  You should get the whooping cough vaccine while pregnant to pass protection to your baby frame support disabled and/or not supported in this browser  Learn why Laura decided to get the whooping cough vaccine in her 3rd trimester of pregnancy and how her baby girl was born with some protection against the disease. Also available on YouTube. After receiving the whooping cough vaccine, your body will create protective antibodies (proteins produced by the body to fight off diseases) and pass some of them to your baby before birth. These antibodies provide your baby some short-term protection against whooping cough in early life. These antibodies can also protect your baby from some of the more serious complications that come along with whooping cough. Your protective antibodies are at their highest about 2 weeks after getting the vaccine, but it takes time to pass them to your baby. So the preferred time to get the whooping cough vaccine is early in your third trimester. The amount of whooping cough antibodies in your body decreases over time. That is why CDC recommends you get a whooping cough vaccine during each pregnancy. Doing so allows each of your babies to get the greatest number of protective antibodies from you. This means each of your babies will get the best protection possible against this disease.  Getting the whooping cough vaccine while pregnant is better than getting the vaccine after you give birth Whooping cough vaccination during   pregnancy is ideal so your baby will have short-term protection as soon as he is born. This early protection is important because your baby will not start getting his whooping cough vaccines until he is 2 months old. These first few months of life are when your baby is at greatest risk for catching whooping cough. This is also when he's at  greatest risk for having severe, potentially life-threating complications from the infection. To avoid that gap in protection, it is best to get a whooping cough vaccine during pregnancy. You will then pass protection to your baby before he is born. To continue protecting your baby, he should get whooping cough vaccines starting at 2 months old. You may never have gotten the Tdap vaccine before and did not get it during this pregnancy. If so, you should make sure to get the vaccine immediately after you give birth, before leaving the hospital or birthing center. It will take about 2 weeks before your body develops protection (antibodies) in response to the vaccine. Once you have protection from the vaccine, you are less likely to give whooping cough to your newborn while caring for him. But remember, your baby will still be at risk for catching whooping cough from others. A recent study looked to see how effective Tdap was at preventing whooping cough in babies whose mothers got the vaccine while pregnant or in the hospital after giving birth. The study found that getting Tdap between 27 through 36 weeks of pregnancy is 85% more effective at preventing whooping cough in babies younger than 2 months old. Blood tests cannot tell if you need a whooping cough vaccine There are no blood tests that can tell you if you have enough antibodies in your body to protect yourself or your baby against whooping cough. Even if you have been sick with whooping cough in the past or previously received the vaccine, you still should get the vaccine during each pregnancy. Breastfeeding may pass some protective antibodies onto your baby By breastfeeding, you may pass some antibodies you have made in response to the vaccine to your baby. When you get a whooping cough vaccine during your pregnancy, you will have antibodies in your breast milk that you can share with your baby as soon as your milk comes in. However, your baby will not  get protective antibodies immediately if you wait to get the whooping cough vaccine until after delivering your baby. This is because it takes about 2 weeks for your body to create antibodies. Learn more about the health benefits of breastfeeding.  

## 2019-10-15 NOTE — Progress Notes (Signed)
I connected with  Janne Lab on 10/15/19 at  4:00 PM EDT by telephone and verified that I am speaking with the correct person using two identifiers.   I discussed the limitations, risks, security and privacy concerns of performing an evaluation and management service by telephone and the availability of in person appointments. I also discussed with the patient that there may be a patient responsible charge related to this service. The patient expressed understanding and agreed to proceed.  Demetrice Jeanella Anton, CMA 10/15/2019  3:48 PM

## 2019-10-15 NOTE — Progress Notes (Signed)
   OBSTETRICS PRENATAL VIRTUAL VISIT ENCOUNTER NOTE  Provider location: [facility]   I connected with Robin Strickland on 10/15/19 at  4:00 PM EDT by MyChart Video Encounter at home and verified that I am speaking with the correct person using two identifiers.   I discussed the limitations, risks, security and privacy concerns of performing an evaluation and management service virtually and the availability of in person appointments. I also discussed with the patient that there may be a patient responsible charge related to this service. The patient expressed understanding and agreed to proceed. Subjective:  Robin Strickland is a 19 y.o. G2P1001 at [redacted]w[redacted]d being seen today for ongoing prenatal care.  She is currently monitored for the following issues for this low-risk pregnancy and has Migraine without aura and without status migrainosus, not intractable; Supervision of other normal pregnancy, antepartum; and UTI (urinary tract infection) during pregnancy on their problem list.  Patient reports no complaints. Denies any VB, contractions, leaking of fluid.  Reports good FM.  The following portions of the patient's history were reviewed and updated as appropriate: allergies, current medications, past family history, past medical history, past social history, past surgical history and problem list.   Objective:  There were no vitals filed for this visit.  Fetal Status:           General:  Alert, oriented and cooperative. Patient is in no acute distress.  Respiratory: Normal respiratory effort, no problems with respiration noted  Mental Status: Normal mood and affect. Normal behavior. Normal judgment and thought content.  Rest of physical exam deferred due to type of encounter  Imaging: No results found.  Assessment and Plan:  Pregnancy: G2P1001 at [redacted]w[redacted]d 1. Supervision of other normal pregnancy, antepartum Discussed  contraception, counseled about LARCs.  Desires IPP [place]. Preterm labor symptoms and general obstetric precautions including but not limited to vaginal bleeding, contractions, leaking of fluid and fetal movement were reviewed in detail with the patient. I discussed the assessment and treatment plan with the patient. The patient was provided an opportunity to ask questions and all were answered. The patient agreed with the plan and demonstrated an understanding of the instructions. The patient was advised to call back or seek an in-person office evaluation/go to MAU at [facility] for any urgent or concerning symptoms. Please refer to After Visit Summary for other counseling recommendations.   I provided 10 minutes of face-to-face time during this encounter.  Return in about 4 weeks (around 11/12/2019) for OFFICE OB Visit, TDap (in office due to Tdap).  Future Appointments  Date Time Provider [place]  11/12/2019  1:30 PM Caren Macadam, MD [facility]    Verita Schneiders, MD [facility] for [facility] [place]

## 2019-11-12 ENCOUNTER — Encounter: Payer: Medicaid Other | Admitting: Family Medicine

## 2019-11-13 ENCOUNTER — Encounter: Payer: Medicaid Other | Admitting: Family Medicine

## 2019-11-20 ENCOUNTER — Other Ambulatory Visit: Payer: Self-pay

## 2019-11-20 ENCOUNTER — Ambulatory Visit (INDEPENDENT_AMBULATORY_CARE_PROVIDER_SITE_OTHER): Payer: Medicaid Other | Admitting: Obstetrics & Gynecology

## 2019-11-20 ENCOUNTER — Encounter: Payer: Self-pay | Admitting: Obstetrics & Gynecology

## 2019-11-20 ENCOUNTER — Other Ambulatory Visit (HOSPITAL_COMMUNITY)
Admission: RE | Admit: 2019-11-20 | Discharge: 2019-11-20 | Disposition: A | Discharge status: Home / Self Care | Payer: Medicaid Other | Source: Ambulatory Visit | Attending: Obstetrics & Gynecology | Admitting: Obstetrics & Gynecology

## 2019-11-20 VITALS — BP 107/72 | HR 102 | Wt 176.4 lb

## 2019-11-20 DIAGNOSIS — O23593 Infection of other part of genital tract in pregnancy, third trimester: Secondary | ICD-10-CM | POA: Diagnosis not present

## 2019-11-20 DIAGNOSIS — Z3480 Encounter for supervision of other normal pregnancy, unspecified trimester: Secondary | ICD-10-CM

## 2019-11-20 DIAGNOSIS — N760 Acute vaginitis: Secondary | ICD-10-CM | POA: Diagnosis not present

## 2019-11-20 DIAGNOSIS — Z3A30 30 weeks gestation of pregnancy: Secondary | ICD-10-CM

## 2019-11-20 DIAGNOSIS — Z23 Encounter for immunization: Secondary | ICD-10-CM | POA: Diagnosis not present

## 2019-11-20 DIAGNOSIS — R319 Hematuria, unspecified: Secondary | ICD-10-CM

## 2019-11-20 LAB — POCT URINALYSIS DIPSTICK
Bilirubin: NEGATIVE
Glucose, UA: NEGATIVE
Ketones, UA: NEGATIVE
Leukocytes, UA: NEGATIVE
Nitrite, UA: NEGATIVE
Odor: NEGATIVE
Protein, UA: NEGATIVE
Spec Grav, UA: 1.015 (ref 1.010–1.025)
Urobilinogen, UA: 0.2 E.U./dL
pH, UA: 7 (ref 5.0–8.0)

## 2019-11-20 NOTE — Patient Instructions (Signed)
Return to office for any scheduled appointments. Call the office or go to the MA[facility] at [facility] [place] if:  You begin to have strong, frequent contractions  Your water breaks.  Sometimes it is a big gush of fluid, sometimes it is just a trickle that keeps getting your panties wet or running down your legs  You have vaginal bleeding.  It is normal to have a small amount of spotting if your cervix was checked.   You do not feel your baby moving like normal.  If you do not, get something to eat and drink and lay down and focus on feeling your baby move.   If your baby is still not moving like normal, you should call the office or go to MAU.  Any other obstetric concerns.   

## 2019-11-20 NOTE — Addendum Note (Signed)
Addended by: Maryruth Eve on: 11/20/2019 09:07 AM   Modules accepted: Orders

## 2019-11-20 NOTE — Progress Notes (Signed)
   PRENATAL VISIT NOTE  Subjective:  Robin Strickland is a 19 y.o. G2P1001 at [redacted]w[redacted]d being seen today for ongoing prenatal care.  She is currently monitored for the following issues for this low-risk pregnancy and has Migraine without aura and without status migrainosus, not intractable; Supervision of other normal pregnancy, antepartum; and UTI (urinary tract infection) during pregnancy on their problem list.  Patient reports vaginal irritation and scant bleeding a few days ago, none currently.  Reports ongoing copious, foul-smelling discharge. No dysuria, back pain.  Increased pelvic pressure as pregnancy gets bigger.  Contractions: Not present. Vag. Bleeding: Scant.  Movement: Present. Denies leaking of fluid.   The following portions of the patient's history were reviewed and updated as appropriate: allergies, current medications, past family history, past medical history, past social history, past surgical history and problem list.   Objective:   Vitals:   11/20/19 0810  BP: 107/72  Pulse: (!) 102  Weight: 176 lb 6.4 oz (80 kg)    Fetal Status: Fetal Heart Rate (bpm): 142 Fundal Height: 31 cm Movement: Present     General:  Alert, oriented and cooperative. Patient is in no acute distress.  Skin: Skin is warm and dry. No rash noted.   Cardiovascular: Normal heart rate noted  Respiratory: Normal respiratory effort, no problems with respiration noted  Abdomen: Soft, gravid, appropriate for gestational age.  Pain/Pressure: Present     Pelvic: Speculum exam performed in the presence of a chaperone.  Scant, thick, white discharge noted; testing sample obtained.  No trace of blood. On visual exam, closed cervix noted Dilation: Closed Effacement (%): Thick Station: Ballotable  Extremities: Normal range of motion.  Edema: None  Mental Status: Normal mood and affect. Normal behavior. Normal judgment and thought content.   Results for orders placed or performed in visit on  11/20/19 (from the past 24 hour(s))  POCT Urinalysis Dipstick     Status: None   Collection Time: 11/20/19  8:42 AM  Result Value Ref Range   Color, UA yellow    Clarity, UA cloudy    Glucose, UA Negative Negative   Bilirubin neg    Ketones, UA neg    Spec Grav, UA 1.015 1.010 - 1.025   Blood, UA trace    pH, UA 7.0 5.0 - 8.0   Protein, UA Negative Negative   Urobilinogen, UA 0.2 0.2 or 1.0 E.U./dL   Nitrite, UA neg    Leukocytes, UA Negative Negative   Appearance     Odor neg     Assessment and Plan:  Pregnancy: G2P1001 at [redacted]w[redacted]d 1. Vaginitis during pregnancy in third trimester Will follow up test results.   - Cervicovaginal ancillary only( [place])  2. Need for Tdap vaccination - Tdap vaccine greater than or equal to 7yo IM  3. Supervision of other normal pregnancy, antepartum Of note, given that POCT U dip showed trace blood, urine culture also sent.  Preterm labor symptoms and general obstetric precautions including but not limited to vaginal bleeding, contractions, leaking of fluid and fetal movement were reviewed in detail with the patient. Please refer to After Visit Summary for other counseling recommendations.   Return in about 2 weeks (around 12/04/2019) for OFFICE OB Visit.  Future Appointments  Date Time Provider [place]  12/03/2019  1:15 PM Woodroe Mode, MD [facility]    Verita Schneiders, MD

## 2019-11-21 LAB — CERVICOVAGINAL ANCILLARY ONLY
Bacterial Vaginitis (gardnerella): NEGATIVE
Candida Glabrata: NEGATIVE
Candida Vaginitis: NEGATIVE
Chlamydia: NEGATIVE
Comment: NEGATIVE
Comment: NEGATIVE
Comment: NEGATIVE
Comment: NEGATIVE
Comment: NEGATIVE
Comment: NORMAL
Neisseria Gonorrhea: NEGATIVE
Trichomonas: NEGATIVE

## 2019-11-22 LAB — URINE CULTURE, OB REFLEX

## 2019-11-22 LAB — CULTURE, OB URINE

## 2019-12-03 ENCOUNTER — Telehealth (INDEPENDENT_AMBULATORY_CARE_PROVIDER_SITE_OTHER): Payer: Medicaid Other | Admitting: Obstetrics & Gynecology

## 2019-12-03 ENCOUNTER — Encounter: Payer: Self-pay | Admitting: Obstetrics & Gynecology

## 2019-12-03 ENCOUNTER — Other Ambulatory Visit: Payer: Self-pay

## 2019-12-03 DIAGNOSIS — O99352 Diseases of the nervous system complicating pregnancy, second trimester: Secondary | ICD-10-CM

## 2019-12-03 DIAGNOSIS — Z87442 Personal history of urinary calculi: Secondary | ICD-10-CM

## 2019-12-03 DIAGNOSIS — G43009 Migraine without aura, not intractable, without status migrainosus: Secondary | ICD-10-CM

## 2019-12-03 DIAGNOSIS — Z3A32 32 weeks gestation of pregnancy: Secondary | ICD-10-CM

## 2019-12-03 DIAGNOSIS — Z3480 Encounter for supervision of other normal pregnancy, unspecified trimester: Secondary | ICD-10-CM

## 2019-12-03 NOTE — Progress Notes (Signed)
I connected with  Robin Strickland on 12/03/19 at  1:15 PM EDT by telephone and verified that I am speaking with the correct person using two identifiers.   I discussed the limitations, risks, security and privacy concerns of performing an evaluation and management service by telephone and the availability of in person appointments. I also discussed with the patient that there may be a patient responsible charge related to this service. The patient expressed understanding and agreed to proceed.  Crosby Oyster, RN 12/03/2019  1:05 PM   Pt states BP cuff isn't working, so will now make in person appts.

## 2019-12-03 NOTE — Patient Instructions (Signed)

## 2019-12-03 NOTE — Progress Notes (Signed)
   TELEHEALTH OBSTETRICS VISIT ENCOUNTER NOTE  I connected with Robin Strickland on 12/03/19 at  1:15 PM EDT by telephone at home and verified that I am speaking with the correct person using two identifiers.   I discussed the limitations, risks, security and privacy concerns of performing an evaluation and management service by telephone and the availability of in person appointments. I also discussed with the patient that there may be a patient responsible charge related to this service. The patient expressed understanding and agreed to proceed.  Subjective:  Robin Strickland is a 19 y.o. G2P1001 at [redacted]w[redacted]d being followed for ongoing prenatal care.  She is currently monitored for the following issues for this low-risk pregnancy and has Migraine without aura and without status migrainosus, not intractable; Supervision of other normal pregnancy, antepartum; and UTI (urinary tract infection) during pregnancy on their problem list.  Patient reports headache and pelvic pressure. Reports fetal movement. Denies any contractions, bleeding or leaking of fluid.   The following portions of the patient's history were reviewed and updated as appropriate: allergies, current medications, past family history, past medical history, past social history, past surgical history and problem list.   Objective:   General:  Alert, oriented and cooperative.   Mental Status: Normal mood and affect perceived. Normal judgment and thought content.  Rest of physical exam deferred due to type of encounter  Assessment and Plan:  Pregnancy: G2P1001 at [redacted]w[redacted]d 1. Supervision of other normal pregnancy, antepartum In person next visit  Preterm labor symptoms and general obstetric precautions including but not limited to vaginal bleeding, contractions, leaking of fluid and fetal movement were reviewed in detail with the patient.  I discussed the assessment and treatment plan with the patient. The  patient was provided an opportunity to ask questions and all were answered. The patient agreed with the plan and demonstrated an understanding of the instructions. The patient was advised to call back or seek an in-person office evaluation/go to MAU at [facility] for any urgent or concerning symptoms. Please refer to After Visit Summary for other counseling recommendations.   I provided 11 minutes of non-face-to-face time during this encounter.  Return in about 2 weeks (around 12/17/2019).  Future Appointments  Date Time Provider [place]  12/17/2019  3:30 PM Donnamae Jude, MD [facility]    Emeterio Reeve, [place] [facility] [place] [place]

## 2019-12-06 ENCOUNTER — Telehealth: Payer: Self-pay

## 2019-12-06 NOTE — Telephone Encounter (Signed)
Received call from patient she reports having +headache with some dizzy. She has not taken anything to help with her headache at this time. Patient reports blood pressure cuff is broken so she is not able to check her blood pressure at this time. I have advised her to try tylenol to see if she gets any relief for her headache. She should be drinking lots of water and eating small meals doing the day. If headache or dizziness does not get any better she should follow up with an appointment or she can go to the [facility] and children unit. Patient voice understanding at this time.

## 2019-12-17 ENCOUNTER — Ambulatory Visit (INDEPENDENT_AMBULATORY_CARE_PROVIDER_SITE_OTHER): Payer: Medicaid Other | Admitting: Family Medicine

## 2019-12-17 ENCOUNTER — Encounter: Payer: Self-pay | Admitting: Family Medicine

## 2019-12-17 ENCOUNTER — Other Ambulatory Visit: Payer: Self-pay

## 2019-12-17 VITALS — BP 106/67 | HR 72 | Wt 185.0 lb

## 2019-12-17 DIAGNOSIS — Z3A34 34 weeks gestation of pregnancy: Secondary | ICD-10-CM

## 2019-12-17 DIAGNOSIS — Z3480 Encounter for supervision of other normal pregnancy, unspecified trimester: Secondary | ICD-10-CM

## 2019-12-17 DIAGNOSIS — Z3483 Encounter for supervision of other normal pregnancy, third trimester: Secondary | ICD-10-CM

## 2019-12-17 NOTE — Patient Instructions (Signed)

## 2019-12-17 NOTE — Progress Notes (Signed)
° °  PRENATAL VISIT NOTE  Subjective:  Robin Strickland is a 19 y.o. G2P1001 at [redacted]w[redacted]d being seen today for ongoing prenatal care.  She is currently monitored for the following issues for this low-risk pregnancy and has Migraine without aura and without status migrainosus, not intractable; Supervision of other normal pregnancy, antepartum; and UTI (urinary tract infection) during pregnancy on their problem list.  Patient reports leaking of fluid x 2 days.  Contractions: Not present.  .  Movement: Present. Denies leaking of fluid.   The following portions of the patient's history were reviewed and updated as appropriate: allergies, current medications, past family history, past medical history, past social history, past surgical history and problem list.   Objective:   Vitals:   12/17/19 1547  BP: 106/67  Pulse: 72  Weight: 185 lb (83.9 kg)    Fetal Status: Fetal Heart Rate (bpm): 142 Fundal Height: 33 cm Movement: Present     General:  Alert, oriented and cooperative. Patient is in no acute distress.  Skin: Skin is warm and dry. No rash noted.   Cardiovascular: Normal heart rate noted  Respiratory: Normal respiratory effort, no problems with respiration noted  Abdomen: Soft, gravid, appropriate for gestational age.  Pain/Pressure: Absent     Pelvic: Cervical exam performed in the presence of a chaperone cervix appears closed, neg pool, neg nitrazine, neg fern        Extremities: Normal range of motion.  Edema: Trace  Mental Status: Normal mood and affect. Normal behavior. Normal judgment and thought content.   Assessment and Plan:  Pregnancy: G2P1001 at [redacted]w[redacted]d 1. Supervision of other normal pregnancy, antepartum No evidence of ROM Cultures next visit Continue routine prenatal care.   Preterm labor symptoms and general obstetric precautions including but not limited to vaginal bleeding, contractions, leaking of fluid and fetal movement were reviewed in detail with the  patient. Please refer to After Visit Summary for other counseling recommendations.   Return in 2 weeks (on 12/31/2019) for in person.  Future Appointments  Date Time Provider [place]  01/01/2020  2:15 PM Constant, Vickii Chafe, MD [facility]    Donnamae Jude, MD

## 2020-01-01 ENCOUNTER — Other Ambulatory Visit (HOSPITAL_COMMUNITY)
Admission: RE | Admit: 2020-01-01 | Discharge: 2020-01-01 | Disposition: A | Discharge status: Home / Self Care | Payer: Medicaid Other | Source: Ambulatory Visit | Attending: Obstetrics and Gynecology | Admitting: Obstetrics and Gynecology

## 2020-01-01 ENCOUNTER — Encounter: Payer: Self-pay | Admitting: Obstetrics and Gynecology

## 2020-01-01 ENCOUNTER — Other Ambulatory Visit: Payer: Self-pay

## 2020-01-01 ENCOUNTER — Ambulatory Visit (INDEPENDENT_AMBULATORY_CARE_PROVIDER_SITE_OTHER): Payer: Medicaid Other | Admitting: Obstetrics and Gynecology

## 2020-01-01 VITALS — BP 117/74 | HR 109 | Wt 186.0 lb

## 2020-01-01 DIAGNOSIS — Z3A36 36 weeks gestation of pregnancy: Secondary | ICD-10-CM

## 2020-01-01 DIAGNOSIS — Z3480 Encounter for supervision of other normal pregnancy, unspecified trimester: Secondary | ICD-10-CM

## 2020-01-01 DIAGNOSIS — Z3483 Encounter for supervision of other normal pregnancy, third trimester: Secondary | ICD-10-CM

## 2020-01-01 NOTE — Progress Notes (Signed)
   PRENATAL VISIT NOTE  Subjective:  Robin Strickland is a 19 y.o. G2P1001 at [redacted]w[redacted]d being seen today for ongoing prenatal care.  She is currently monitored for the following issues for this low-risk pregnancy and has Migraine without aura and without status migrainosus, not intractable; Supervision of other normal pregnancy, antepartum; and UTI (urinary tract infection) during pregnancy on their problem list.  Patient reports no complaints.  Contractions: Irregular. Vag. Bleeding: None.  Movement: Present. Denies leaking of fluid.   The following portions of the patient's history were reviewed and updated as appropriate: allergies, current medications, past family history, past medical history, past social history, past surgical history and problem list.   Objective:   Vitals:   01/01/20 1411  BP: 117/74  Pulse: (!) 109  Weight: 186 lb (84.4 kg)    Fetal Status: Fetal Heart Rate (bpm): 162 Fundal Height: 36 cm Movement: Present     General:  Alert, oriented and cooperative. Patient is in no acute distress.  Skin: Skin is warm and dry. No rash noted.   Cardiovascular: Normal heart rate noted  Respiratory: Normal respiratory effort, no problems with respiration noted  Abdomen: Soft, gravid, appropriate for gestational age.  Pain/Pressure: Present     Pelvic: Cervical exam performed in the presence of a chaperone Dilation: 1 Effacement (%): Thick Station: Ballotable  Extremities: Normal range of motion.  Edema: Trace  Mental Status: Normal mood and affect. Normal behavior. Normal judgment and thought content.   Assessment and Plan:  Pregnancy: G2P1001 at [redacted]w[redacted]d 1. Supervision of other normal pregnancy, antepartum Patient is doing well without complaints Cultures today - Cervicovaginal ancillary only( [place]) - Culture, beta strep (group b only)  Preterm labor symptoms and general obstetric precautions including but not limited to vaginal bleeding, contractions,  leaking of fluid and fetal movement were reviewed in detail with the patient. Please refer to After Visit Summary for other counseling recommendations.   Return in about 1 week (around 01/08/2020) for in person, ROB, Low risk.  No future appointments.  Mora Bellman, MD

## 2020-01-02 LAB — CERVICOVAGINAL ANCILLARY ONLY
Chlamydia: NEGATIVE
Comment: NEGATIVE
Comment: NORMAL
Neisseria Gonorrhea: NEGATIVE

## 2020-01-04 LAB — CULTURE, BETA STREP (GROUP B ONLY): Strep Gp B Culture: NEGATIVE

## 2020-01-07 ENCOUNTER — Other Ambulatory Visit: Payer: Self-pay

## 2020-01-07 ENCOUNTER — Ambulatory Visit (INDEPENDENT_AMBULATORY_CARE_PROVIDER_SITE_OTHER): Payer: Medicaid Other | Admitting: *Deleted

## 2020-01-07 ENCOUNTER — Inpatient Hospital Stay (HOSPITAL_COMMUNITY)
Admission: AD | Admit: 2020-01-07 | Discharge: 2020-01-07 | Disposition: A | Discharge status: Home / Self Care | Payer: Medicaid Other | Attending: Obstetrics & Gynecology | Admitting: Obstetrics & Gynecology

## 2020-01-07 ENCOUNTER — Encounter (HOSPITAL_COMMUNITY): Payer: Self-pay | Admitting: Obstetrics & Gynecology

## 2020-01-07 VITALS — BP 117/77 | HR 112

## 2020-01-07 DIAGNOSIS — Z3480 Encounter for supervision of other normal pregnancy, unspecified trimester: Secondary | ICD-10-CM

## 2020-01-07 DIAGNOSIS — O26893 Other specified pregnancy related conditions, third trimester: Secondary | ICD-10-CM | POA: Diagnosis not present

## 2020-01-07 DIAGNOSIS — O471 False labor at or after 37 completed weeks of gestation: Secondary | ICD-10-CM | POA: Insufficient documentation

## 2020-01-07 DIAGNOSIS — O2343 Unspecified infection of urinary tract in pregnancy, third trimester: Secondary | ICD-10-CM | POA: Diagnosis not present

## 2020-01-07 DIAGNOSIS — R300 Dysuria: Secondary | ICD-10-CM | POA: Diagnosis not present

## 2020-01-07 DIAGNOSIS — Z3A37 37 weeks gestation of pregnancy: Secondary | ICD-10-CM

## 2020-01-07 DIAGNOSIS — O479 False labor, unspecified: Secondary | ICD-10-CM

## 2020-01-07 LAB — POCT URINALYSIS DIPSTICK

## 2020-01-07 LAB — URINALYSIS, ROUTINE W REFLEX MICROSCOPIC
Bilirubin Urine: NEGATIVE
Glucose, UA: 50 mg/dL — AB
Ketones, ur: NEGATIVE mg/dL
Nitrite: NEGATIVE
Protein, ur: 30 mg/dL — AB
RBC / HPF: >50 RBC/hpf — ABNORMAL HIGH (ref 0–5)
Specific Gravity, Urine: 1.017 (ref 1.005–1.030)
pH: 6 (ref 5.0–8.0)

## 2020-01-07 MED ORDER — CEFADROXIL 500 MG PO CAPS: 500.0000 mg | ORAL_CAPSULE | Freq: Two times a day (BID) | ORAL | 0 refills | Status: AC

## 2020-01-07 MED ORDER — ACETAMINOPHEN 500 MG PO TABS
1000.0000 mg | ORAL_TABLET | Freq: Once | ORAL | Status: AC
  Administered 2020-01-07: 1000 mg via ORAL
  Filled 2020-01-07: qty 2

## 2020-01-07 MED ORDER — CEPHALEXIN 500 MG PO CAPS
500.0000 mg | ORAL_CAPSULE | Freq: Once | ORAL | Status: AC
  Administered 2020-01-07: 500 mg via ORAL
  Filled 2020-01-07: qty 1

## 2020-01-07 MED ORDER — NITROFURANTOIN MONOHYD MACRO 100 MG PO CAPS: 100.0000 mg | ORAL_CAPSULE | Freq: Two times a day (BID) | ORAL | 0 refills | Status: DC

## 2020-01-07 NOTE — MAU Note (Signed)
Pt here with reports of contractions that started today. Reports pain goes away for a little bit and then comes back. Pt denies vaginal bleeding or LOF. Has some discharge. Has some pelvic pressure. Good fetal movement today. Cervix was 1.5cm last week.

## 2020-01-07 NOTE — MAU Provider Note (Signed)
Chief Complaint:  Labor Eval   First Provider Initiated Contact with Patient 01/07/20 2251     HPI: Robin Strickland is a 18 y.o. G2P1001 at [redacted]w[redacted]d who presents to maternity admissions reporting contractions and back pain.  Also reports dysuria and increased urinary frequency.  Went to the office earlier today for a urine sample and diagnosed with a urinary tract infection.  Was prescribed antibiotics but has not picked them up yet.  Denies nausea, fever, flank pain, hematuria, vaginal bleeding, or loss of fluid.  Good fetal movement.  Location: abdomen & back Quality: contractions & aching Severity: 6/10 in pain scale Duration: 1 day Timing: intermittent Modifying factors: none Associated signs and symptoms: dysuria & urinary frequency  Pregnancy Course: [facility]. Diagnosed with UTI today.   Past Medical History:  Diagnosis Date  . Gestational diabetes   . History of gestational diabetes 04/19/2016   2021: early a1c negative.    OB History  Gravida Para Term Preterm AB Living  2 1 1     1  SAB TAB Ectopic Multiple Live Births        0 1    # Outcome Date GA Lbr Len/2nd Weight Sex Delivery Anes PTL Lv  2 Current           1 Term 06/17/16 [redacted]w[redacted]d 02:57 / 00:50 3289 g F Vag-Spont EPI  LIV   Past Surgical History:  Procedure Laterality Date  . NO PAST SURGERIES     Family History  Problem Relation Age of Onset  . Healthy Mother   . Healthy Father    Social History   Tobacco Use  . Smoking status: Never Smoker  . Smokeless tobacco: Never Used  Vaping Use  . Vaping Use: Never used  Substance Use Topics  . Alcohol use: No  . Drug use: No   No Known Allergies No medications prior to admission.    I have reviewed patient's Past Medical Hx, Surgical Hx, Family Hx, Social Hx, medications and allergies.   ROS:  Review of Systems  Constitutional: Negative.   Gastrointestinal: Positive for abdominal pain. Negative for diarrhea, nausea and vomiting.   Genitourinary: Positive for dysuria and frequency. Negative for flank pain, hematuria, vaginal bleeding and vaginal discharge.  Musculoskeletal: Positive for back pain.    Physical Exam   Patient Vitals for the past 24 hrs:  BP Temp Temp src Pulse Resp SpO2 Height Weight  01/07/20 2329 114/66 -- -- (!) 102 16 100 % -- --  01/07/20 2133 122/82 98.8 F (37.1 C) Oral (!) 107 18 99 % 5' 1" (1.549 m) 84.9 kg    Constitutional: Well-developed, well-nourished female in no acute distress.  Cardiovascular: normal rate & rhythm, no murmur Respiratory: normal effort, lung sounds clear throughout GI: No CVAT. Abd soft, non-tender, gravid appropriate for gestational age. Pos BS x 4 MS: Extremities nontender, no edema, normal ROM Neurologic: Alert and oriented x 4.  GU:      Dilation: 1.5 Effacement (%): Thick Cervical Position: Posterior Station: -3 Presentation: Vertex Exam by:: Danielle Simpson, RN  Fetal Tracing:  Baseline: 155 Variability: moderate Accelerations: 15x15 Decelerations: none  Toco: irregular    Labs: Results for orders placed or performed during the hospital encounter of 01/07/20 (from the past 24 hour(s))  Urinalysis, Routine w reflex microscopic     Status: Abnormal   Collection Time: 01/07/20 10:06 PM  Result Value Ref Range   Color, Urine YELLOW YELLOW   APPearance HAZY (A) CLEAR     Specific Gravity, Urine 1.017 1.005 - 1.030   pH 6.0 5.0 - 8.0   Glucose, UA 50 (A) NEGATIVE mg/dL   Hgb urine dipstick MODERATE (A) NEGATIVE   Bilirubin Urine NEGATIVE NEGATIVE   Ketones, ur NEGATIVE NEGATIVE mg/dL   Protein, ur 30 (A) NEGATIVE mg/dL   Nitrite NEGATIVE NEGATIVE   Leukocytes,Ua SMALL (A) NEGATIVE   RBC / HPF >50 (H) 0 - 5 RBC/hpf   WBC, UA 6-10 0 - 5 WBC/hpf   Bacteria, UA RARE (A) NONE SEEN   Squamous Epithelial / LPF 11-20 0 - 5   Mucus PRESENT     Imaging:  No results found.  MAU Course: Orders Placed This Encounter  Procedures  . Urinalysis,  Routine w reflex microscopic  . Contraction - monitoring  . External fetal heart monitoring  . Vaginal exam  . Discharge patient   Meds ordered this encounter  Medications  . cephALEXin (KEFLEX) capsule 500 mg  . acetaminophen (TYLENOL) tablet 1,000 mg  . cefadroxil (DURICEF) 500 MG capsule    Sig: Take 1 capsule (500 mg total) by mouth 2 (two) times daily for 7 days.    Dispense:  14 capsule    Refill:  0    Order Specific Question:   Supervising Provider    Answer:   ARNOLD, JAMES G [[id number]    MDM: Cervix unchanged while in MAU & irregular contractions on monitor.   U/a consistent with UTI. Urine culture from office is pending. Patient was prescribed macrobid but hadn't picked it up yet. Will d/c macrobid due to advanced gestational age & prescribe duracef. Keflex dose given in MAU prior to discharge (no duracef in hospital pharmacy).  Pt is afebrile & no CVAT. No evidence of pyelo at this time.   Assessment: 1. Urinary tract infection in mother during third trimester of pregnancy   2. False labor   3. [redacted] weeks gestation of pregnancy     Plan: Discharge home in stable condition.  D/c macrobid Rx duracef Discussed reasons to return to MAU    Allergies as of 01/07/2020   No Known Allergies     Medication List    STOP taking these medications   nitrofurantoin (macrocrystal-monohydrate) 100 MG capsule Commonly known as: MACROBID     TAKE these medications   Blood Pressure Kit 1 Device by Does not apply route once a week. To be monitored weekly from home   cefadroxil 500 MG capsule Commonly known as: DURICEF Take 1 capsule (500 mg total) by mouth 2 (two) times daily for 7 days.   Comfort Fit Maternity Supp Sm Misc 1 Units by Does not apply route daily as needed.   PrePLUS 27-1 MG Tabs Take 1 tablet by mouth daily.       Lawrence, Erin, NP 01/08/2020 12:30 AM  

## 2020-01-07 NOTE — Telephone Encounter (Signed)
Called patient to discuss symptoms she is having. Pt states she has had cramping, and low back pain that is mot getting better. Pt states she is also having pain with urination and going more frequently. Will have pt come into the office to give urine sample to rule out UTI.

## 2020-01-07 NOTE — Progress Notes (Signed)
SUBJECTIVE: Robin Strickland is a 19 y.o. female who complains of urinary frequency, urgency and dysuria x 3 days, without flank pain, fever, chills, or abnormal vaginal discharge or bleeding.   OBJECTIVE: Appears well, in no apparent distress.  Vital signs are normal. Urine dipstick shows positive for RBC's and positive for leukocytes.    ASSESSMENT: Dysuria  PLAN: Treatment per orders.  Call or return to clinic prn if these symptoms worsen or fail to improve as anticipated.

## 2020-01-07 NOTE — Discharge Instructions (Signed)
Pregnancy and Urinary Tract Infection ° °A urinary tract infection (UTI) is an infection of any part of the urinary tract. This includes the kidneys, the tubes that connect your kidneys to your bladder (ureters), the bladder, and the tube that carries urine out of your body (urethra). These organs make, store, and get rid of urine in the body. Your health care provider may use other names to describe the infection. An upper UTI affects the ureters and kidneys (pyelonephritis). A lower UTI affects the bladder (cystitis) and urethra (urethritis). °Most urinary tract infections are caused by bacteria in your genital area, around the entrance to your urinary tract (urethra). These bacteria grow and cause irritation and inflammation of your urinary tract. You are more likely to develop a UTI during pregnancy because the physical and hormonal changes your body goes through can make it easier for bacteria to get into your urinary tract. Your growing baby also puts pressure on your bladder and can affect urine flow. It is important to recognize and treat UTIs in pregnancy because of the risk of serious complications for both you and your baby. °How does this affect me? °Symptoms of a UTI include: °· Needing to urinate right away (urgently). °· Frequent urination or passing small amounts of urine frequently. °· Pain or burning with urination. °· Blood in the urine. °· Urine that smells bad or unusual. °· Trouble urinating. °· Cloudy urine. °· Pain in the abdomen or lower back. °· Vaginal discharge. °You may also have: °· Vomiting or a decreased appetite. °· Confusion. °· Irritability or tiredness. °· A fever. °· Diarrhea. °How does this affect my baby? °An untreated UTI during pregnancy could lead to a kidney infection or a systemic infection, which can cause health problems that could affect your baby. Possible complications of an untreated UTI include: °· Giving birth to your baby before 37 weeks of pregnancy  (premature). °· Having a baby with a low birth weight. °· Developing high blood pressure during pregnancy (preeclampsia). °· Having a low hemoglobin level (anemia). °What can I do to lower my risk? °To prevent a UTI: °· Go to the bathroom as soon as you feel the need. Do not hold urine for long periods of time. °· Always wipe from front to back, especially after a bowel movement. Use each tissue one time when you wipe. °· Empty your bladder after sex. °· Keep your genital area dry. °· Drink 6-10 glasses of water each day. °· Do not douche or use deodorant sprays. °How is this treated? °Treatment for this condition may include: °· Antibiotic medicines that are safe to take during pregnancy. °· Other medicines to treat less common causes of UTI. °Follow these instructions at home: °· If you were prescribed an antibiotic medicine, take it as told by your health care provider. Do not stop using the antibiotic even if you start to feel better. °· Keep all follow-up visits as told by your health care provider. This is important. °Contact a health care provider if: °· Your symptoms do not improve or they get worse. °· You have abnormal vaginal discharge. °Get help right away if you: °· Have a fever. °· Have nausea and vomiting. °· Have back or side pain. °· Feel contractions in your uterus. °· Have lower belly pain. °· Have a gush of fluid from your vagina. °· Have blood in your urine. °Summary °· A urinary tract infection (UTI) is an infection of any part of the urinary tract, which includes the   kidneys, ureters, bladder, and urethra. °· Most urinary tract infections are caused by bacteria in your genital area, around the entrance to your urinary tract (urethra). °· You are more likely to develop a UTI during pregnancy. °· If you were prescribed an antibiotic medicine, take it as told by your health care provider. Do not stop using the antibiotic even if you start to feel better. °This information is not intended to  replace advice given to you by your health care provider. Make sure you discuss any questions you have with your health care provider. °Document Revised: 09/15/2018 Document Reviewed: 04/27/2018 °Elsevier Patient Education © 2020 [employer] ° °

## 2020-01-08 ENCOUNTER — Encounter (HOSPITAL_COMMUNITY): Payer: Self-pay | Admitting: Obstetrics & Gynecology

## 2020-01-08 ENCOUNTER — Encounter: Payer: Medicaid Other | Admitting: Obstetrics and Gynecology

## 2020-01-09 ENCOUNTER — Ambulatory Visit (INDEPENDENT_AMBULATORY_CARE_PROVIDER_SITE_OTHER): Payer: Medicaid Other | Admitting: Advanced Practice Midwife

## 2020-01-09 ENCOUNTER — Other Ambulatory Visit: Payer: Self-pay

## 2020-01-09 ENCOUNTER — Encounter: Payer: Medicaid Other | Admitting: Advanced Practice Midwife

## 2020-01-09 VITALS — BP 115/77 | HR 101 | Wt 187.5 lb

## 2020-01-09 DIAGNOSIS — O2343 Unspecified infection of urinary tract in pregnancy, third trimester: Secondary | ICD-10-CM

## 2020-01-09 DIAGNOSIS — Z3A37 37 weeks gestation of pregnancy: Secondary | ICD-10-CM

## 2020-01-09 LAB — CULTURE, OB URINE

## 2020-01-09 LAB — URINE CULTURE, OB REFLEX: Organism ID, Bacteria: NO GROWTH

## 2020-01-09 NOTE — Progress Notes (Signed)
Patient was assessed and managed by nursing staff during this encounter. I have reviewed the chart and agree with the documentation and plan. I have also made any necessary editorial changes.  Aletha Halim, MD 01/09/2020 8:33 AM

## 2020-01-09 NOTE — Progress Notes (Signed)
   PRENATAL VISIT NOTE  Subjective:  Robin Strickland is a 19 y.o. G2P1001 at [redacted]w[redacted]d being seen today for ongoing prenatal care.  She is currently monitored for the following issues for this low-risk pregnancy and has Migraine without aura and without status migrainosus, not intractable; Supervision of other normal pregnancy, antepartum; and UTI (urinary tract infection) during pregnancy on their problem list.  Patient reports no complaints. She is compliant with Duricef as prescribed from MAU visit for UTI.   Contractions: Not present.  .  Movement: Present. Denies leaking of fluid.   The following portions of the patient's history were reviewed and updated as appropriate: allergies, current medications, past family history, past medical history, past social history, past surgical history and problem list. Problem list updated.  Objective:   Vitals:   01/09/20 1528  BP: 115/77  Pulse: (!) 101  Weight: 187 lb 8 oz (85 kg)    Fetal Status: Fetal Heart Rate (bpm): 164   Movement: Present     General:  Alert, oriented and cooperative. Patient is in no acute distress.  Skin: Skin is warm and dry. No rash noted.   Cardiovascular: Normal heart rate noted  Respiratory: Normal respiratory effort, no problems with respiration noted  Abdomen: Soft, gravid, appropriate for gestational age.  Pain/Pressure: Present     Pelvic: Cervical exam deferred        Extremities: Normal range of motion.  Edema: Trace  Mental Status: Normal mood and affect. Normal behavior. Normal judgment and thought content.   Assessment and Plan:  Pregnancy: G2P1001 at [redacted]w[redacted]d  1. [redacted] weeks gestation of pregnancy - Routine care - Discussed membrane sweeping when 39 and 40 weeks. Reviewed Cochrane data. Reviewed risk of cramping, contractions, bleeding and ROM. Answered patient questions   2. Urinary tract infection in mother during third trimester of pregnancy - Normal urine culture  Term labor symptoms  and general obstetric precautions including but not limited to vaginal bleeding, contractions, leaking of fluid and fetal movement were reviewed in detail with the patient. Please refer to After Visit Summary for other counseling recommendations.  Return in about 1 week (around 01/16/2020).  Future Appointments  Date Time Provider [place]  01/17/2020 10:45 AM Aletha Halim, MD [facility]    Darlina Rumpf, CNM

## 2020-01-09 NOTE — Patient Instructions (Signed)

## 2020-01-15 ENCOUNTER — Telehealth: Payer: Self-pay | Admitting: *Deleted

## 2020-01-15 NOTE — Telephone Encounter (Signed)
Called pt back. Pt states she was leaking fluid all day yesterday but it has stopped today. Pt also states she has been shaky, nauseous, HA, cramping, diarrhea and  just not feeling well. Advised pt that she should go to MAU due to the multiple symptoms she is having and to make sure her water isn't broke.  Pt verbalizes and understands.

## 2020-01-17 ENCOUNTER — Ambulatory Visit (INDEPENDENT_AMBULATORY_CARE_PROVIDER_SITE_OTHER): Payer: Medicaid Other | Admitting: Obstetrics and Gynecology

## 2020-01-17 ENCOUNTER — Other Ambulatory Visit: Payer: Self-pay

## 2020-01-17 VITALS — BP 114/75 | HR 90 | Wt 186.0 lb

## 2020-01-17 DIAGNOSIS — O2343 Unspecified infection of urinary tract in pregnancy, third trimester: Secondary | ICD-10-CM

## 2020-01-17 DIAGNOSIS — Z3480 Encounter for supervision of other normal pregnancy, unspecified trimester: Secondary | ICD-10-CM

## 2020-01-17 DIAGNOSIS — Z3A39 39 weeks gestation of pregnancy: Secondary | ICD-10-CM

## 2020-01-17 NOTE — Progress Notes (Signed)
Prenatal Visit Note Date: 01/17/2020 Clinic: [facility]-  Subjective:  Robin Strickland is a 19 y.o. G2P1001 at [redacted]w[redacted]d being seen today for ongoing prenatal care.  She is currently monitored for the following issues for this low-risk pregnancy and has Migraine without aura and without status migrainosus, not intractable; Supervision of other normal pregnancy, antepartum; and UTI (urinary tract infection) during pregnancy on their problem list.  Patient reports no complaints.   Contractions: Irregular. Vag. Bleeding: None.  Movement: Present. Denies leaking of fluid.   The following portions of the patient's history were reviewed and updated as appropriate: allergies, current medications, past family history, past medical history, past social history, past surgical history and problem list. Problem list updated.  Objective:   Vitals:   01/17/20 1110  BP: 114/75  Pulse: 90  Weight: 186 lb (84.4 kg)    Fetal Status: Fetal Heart Rate (bpm): 155 Fundal Height: 39 cm Movement: Present  Presentation: Vertex  General:  Alert, oriented and cooperative. Patient is in no acute distress.  Skin: Skin is warm and dry. No rash noted.   Cardiovascular: Normal heart rate noted  Respiratory: Normal respiratory effort, no problems with respiration noted  Abdomen: Soft, gravid, appropriate for gestational age. Pain/Pressure: Present     Pelvic:  Cervical exam deferred Dilation: 1.5 Effacement (%): 50 Station: -3  Extremities: Normal range of motion.  Edema: Trace  Mental Status: Normal mood and affect. Normal behavior. Normal judgment and thought content.   Urinalysis:      Assessment and Plan:  Pregnancy: G2P1001 at [redacted]w[redacted]d  1. Supervision of other normal pregnancy, antepartum Routine care. Set up for 41wk IOL. D/w her nv re: FB placement  2. [redacted] weeks gestation of pregnancy  Term labor symptoms and general obstetric precautions including but not limited to  vaginal bleeding, contractions, leaking of fluid and fetal movement were reviewed in detail with the patient. Please refer to After Visit Summary for other counseling recommendations.  Return in about 1 week (around 01/24/2020) for low risk, in person.   Aletha Halim, MD

## 2020-01-18 ENCOUNTER — Inpatient Hospital Stay (HOSPITAL_COMMUNITY): Payer: Medicaid Other | Admitting: Anesthesiology

## 2020-01-18 ENCOUNTER — Encounter (HOSPITAL_COMMUNITY): Payer: Self-pay | Admitting: Obstetrics and Gynecology

## 2020-01-18 ENCOUNTER — Other Ambulatory Visit: Payer: Self-pay

## 2020-01-18 ENCOUNTER — Inpatient Hospital Stay (HOSPITAL_COMMUNITY)
Admission: AD | Admit: 2020-01-18 | Discharge: 2020-01-20 | DRG: 807 | Disposition: A | Discharge status: Home / Self Care | Payer: Medicaid Other | Attending: Obstetrics and Gynecology | Admitting: Obstetrics and Gynecology

## 2020-01-18 DIAGNOSIS — Z20822 Contact with and (suspected) exposure to covid-19: Secondary | ICD-10-CM | POA: Diagnosis not present

## 2020-01-18 DIAGNOSIS — Z30430 Encounter for insertion of intrauterine contraceptive device: Secondary | ICD-10-CM | POA: Diagnosis not present

## 2020-01-18 DIAGNOSIS — O26893 Other specified pregnancy related conditions, third trimester: Secondary | ICD-10-CM | POA: Diagnosis not present

## 2020-01-18 DIAGNOSIS — Z3A39 39 weeks gestation of pregnancy: Secondary | ICD-10-CM | POA: Diagnosis not present

## 2020-01-18 DIAGNOSIS — O24429 Gestational diabetes mellitus in childbirth, unspecified control: Secondary | ICD-10-CM | POA: Diagnosis not present

## 2020-01-18 LAB — CBC
HCT: 38.1 % (ref 36.0–46.0)
Hemoglobin: 12.3 g/dL (ref 12.0–15.0)
MCH: 29.4 pg (ref 26.0–34.0)
MCHC: 32.3 g/dL (ref 30.0–36.0)
MCV: 90.9 fL (ref 80.0–100.0)
Platelets: 350 10*3/uL (ref 150–400)
RBC: 4.19 MIL/uL (ref 3.87–5.11)
RDW: 14.7 % (ref 11.5–15.5)
WBC: 13.5 10*3/uL — ABNORMAL HIGH (ref 4.0–10.5)
nRBC: 0 % (ref 0.0–0.2)

## 2020-01-18 LAB — SARS CORONAVIRUS 2 BY RT PCR (HOSPITAL ORDER, PERFORMED IN ~~LOC~~ HOSPITAL LAB): SARS Coronavirus 2: NEGATIVE

## 2020-01-18 LAB — TYPE AND SCREEN
ABO/RH(D): O POS
Antibody Screen: NEGATIVE

## 2020-01-18 MED ORDER — EPHEDRINE 5 MG/ML INJ: 10.0000 mg | INTRAVENOUS | Status: DC | PRN

## 2020-01-18 MED ORDER — FENTANYL-BUPIVACAINE-NACL 0.5-0.125-0.9 MG/250ML-% EP SOLN
12.0000 mL/h | EPIDURAL | Status: DC | PRN
  Filled 2020-01-18: qty 250

## 2020-01-18 MED ORDER — OXYTOCIN BOLUS FROM INFUSION
333.0000 mL | Freq: Once | INTRAVENOUS | Status: AC
  Administered 2020-01-19: 333 mL via INTRAVENOUS

## 2020-01-18 MED ORDER — ONDANSETRON HCL 4 MG/2ML IJ SOLN: 4.0000 mg | Freq: Four times a day (QID) | INTRAMUSCULAR | Status: DC | PRN

## 2020-01-18 MED ORDER — PHENYLEPHRINE 40 MCG/ML (10ML) SYRINGE FOR IV PUSH (FOR BLOOD PRESSURE SUPPORT): 80.0000 ug | PREFILLED_SYRINGE | INTRAVENOUS | Status: DC | PRN

## 2020-01-18 MED ORDER — LIDOCAINE HCL (PF) 1 % IJ SOLN: 30.0000 mL | INTRAMUSCULAR | Status: DC | PRN

## 2020-01-18 MED ORDER — OXYCODONE-ACETAMINOPHEN 5-325 MG PO TABS: 1.0000 | ORAL_TABLET | ORAL | Status: DC | PRN

## 2020-01-18 MED ORDER — LIDOCAINE HCL (PF) 1 % IJ SOLN
INTRAMUSCULAR | Status: DC | PRN
  Administered 2020-01-18: 12 mL via EPIDURAL

## 2020-01-18 MED ORDER — LACTATED RINGERS IV SOLN
500.0000 mL | Freq: Once | INTRAVENOUS | Status: AC
  Administered 2020-01-18: 500 mL via INTRAVENOUS

## 2020-01-18 MED ORDER — SODIUM CHLORIDE (PF) 0.9 % IJ SOLN
INTRAMUSCULAR | Status: DC | PRN
  Administered 2020-01-18: 12 mL/h via EPIDURAL

## 2020-01-18 MED ORDER — LACTATED RINGERS IV SOLN: INTRAVENOUS | Status: DC

## 2020-01-18 MED ORDER — DIPHENHYDRAMINE HCL 50 MG/ML IJ SOLN: 12.5000 mg | INTRAMUSCULAR | Status: DC | PRN

## 2020-01-18 MED ORDER — LACTATED RINGERS IV SOLN: 500.0000 mL | INTRAVENOUS | Status: DC | PRN

## 2020-01-18 MED ORDER — OXYCODONE-ACETAMINOPHEN 5-325 MG PO TABS: 2.0000 | ORAL_TABLET | ORAL | Status: DC | PRN

## 2020-01-18 MED ORDER — ACETAMINOPHEN 325 MG PO TABS
650.0000 mg | ORAL_TABLET | ORAL | Status: DC | PRN
  Administered 2020-01-19: 650 mg via ORAL
  Filled 2020-01-18: qty 2

## 2020-01-18 MED ORDER — SOD CITRATE-CITRIC ACID 500-334 MG/5ML PO SOLN: 30.0000 mL | ORAL | Status: DC | PRN

## 2020-01-18 MED ORDER — OXYTOCIN-SODIUM CHLORIDE 30-0.9 UT/500ML-% IV SOLN
2.5000 [IU]/h | INTRAVENOUS | Status: DC
  Filled 2020-01-18: qty 500

## 2020-01-18 NOTE — MAU Note (Signed)
Contractions started last night, now every 5 min. No bleeding or leaking.  Was 1+ yesterday, had membranes swept.

## 2020-01-18 NOTE — MAU Note (Signed)
Assumed care of patient at 1900

## 2020-01-18 NOTE — H&P (Addendum)
.. OBSTETRIC ADMISSION HISTORY AND PHYSICAL  Robin Strickland is a 19 y.o. female G2P1001 with IUP at [redacted]w[redacted]d by LMP c/w early US presenting for spontaneous onset of labor. She reports +FMs, No LOF, no VB, no blurry vision, headaches or peripheral edema, and RUQ pain.  She plans on breast feeding. She requests ParaGard insertion pospartum for birth control. She received her prenatal care at [place]   Dating: By LMP c/w early US --->  Estimated Date of Delivery: 01/24/20  Sono:    @[redacted]w[redacted]d, CWD, normal anatomy, cephalic presentation,  417 g, 15 ounces, 56% EFW  Notes state normal 2 hrGTT.  1 hr. Was 180   Robin Strickland is resting comfortably after receiving an epidural. Recheck of her cervix was 6/80/-2, 2 hrs since last check when she was 5 cm.  Discussed the plan of awaiting active labor and she and her partner did not have any questions or concerns at this time.  Let them know ofd our availability should any questions or concerns arise.     Prenatal History/Complications:  Past Medical History: Past Medical History:  Diagnosis Date  . Gestational diabetes   . History of gestational diabetes 04/19/2016   2021: early a1c negative.     Past Surgical History: Past Surgical History:  Procedure Laterality Date  . NO PAST SURGERIES      Obstetrical History: OB History    Gravida  2   Para  1   Term  1   Preterm      AB      Living  1     SAB      TAB      Ectopic      Multiple  0   Live Births  1           Social History: Social History   Socioeconomic History  . Marital status: Married    Spouse name: Not on file  . Number of children: Not on file  . Years of education: Not on file  . Highest education level: Not on file  Occupational History  . Not on file  Tobacco Use  . Smoking status: Never Smoker  . Smokeless tobacco: Never Used  Vaping Use  . Vaping Use: Never used  Substance and Sexual Activity  . Alcohol use: No  . Drug use: No  .  Sexual activity: Yes    Birth control/protection: None  Other Topics Concern  . Not on file  Social History Narrative  . Not on file   Social Determinants of Health   Financial Resource Strain:   . Difficulty of Paying Living Expenses:   Food Insecurity:   . Worried About Running Out of Food in the Last Year:   . Ran Out of Food in the Last Year:   Transportation Needs:   . Lack of Transportation (Medical):   . Lack of Transportation (Non-Medical):   Physical Activity:   . Days of Exercise per Week:   . Minutes of Exercise per Session:   Stress:   . Feeling of Stress :   Social Connections:   . Frequency of Communication with Friends and Family:   . Frequency of Social Gatherings with Friends and Family:   . Attends Religious Services:   . Active Member of Clubs or Organizations:   . Attends Club or Organization Meetings:   . Marital Status:     Family History: Family History  Problem Relation Age of Onset  . Healthy Mother   .   Healthy Father   . Hypertension Maternal Grandmother   . Kidney disease Maternal Grandmother     Allergies: No Known Allergies  Medications Prior to Admission  Medication Sig Dispense Refill Last Dose  . Prenatal Vit-Fe Fumarate-FA (PREPLUS) 27-1 MG TABS Take 1 tablet by mouth daily. 30 tablet 13 01/17/2020 at Unknown time  . Blood Pressure KIT 1 Device by Does not apply route once a week. To be monitored weekly from home (Patient not taking: Reported on 01/09/2020) 1 kit 0   . Elastic Bandages & Supports (COMFORT FIT MATERNITY SUPP SM) MISC 1 Units by Does not apply route daily as needed. 1 each 0      Review of Systems   All systems reviewed and negative except as stated in HPI  Blood pressure 109/75, pulse 96, temperature 97.8 F (36.6 C), temperature source Oral, resp. rate 18, height 5' 1" (1.549 m), weight 85.3 kg, last menstrual period 04/19/2019, SpO2 100 %. General appearance: alert, cooperative and appears stated age Lungs:  clear to auscultation bilaterally Heart: regular rate and rhythm Abdomen: soft, non-tender; bowel sounds normal Extremities: Homans sign is negative, no sign of DVT Presentation: cephalic Fetal monitoringBaseline: 140 bpm, Variability: Good {> 6 bpm), Accelerations: Reactive and Decelerations: Absent Uterine activityFrequency: Every 3-4 minutes, Duration: 30 seconds and mild Intensity: Dilation: 6 Effacement (%): 70 Station: -2 Exam by:: Kathy Crowley student CNM   Prenatal labs: ABO, Rh: --/--/O POS (08/13 1928) Antibody: NEG (08/13 1928) Rubella: 1.43 (02/03 0829) RPR: Non Reactive (05/07 0922)  HBsAg: Negative (02/03 0829)  HIV: Non Reactive (05/07 0923)  GBS: Negative/-- (07/27 1422)  1 hr Glucola: normal 2 hr  Genetic screening  Declined  Anatomy US: normal at 21 wks 1 day   Prenatal Transfer Tool  Maternal Diabetes: no Genetic Screening: Declined Maternal Ultrasounds/Referrals: Normal Fetal Ultrasounds or other Referrals:  None Maternal Substance Abuse:  No Significant Maternal Medications:  None Significant Maternal Lab Results: Group B Strep negative  Results for orders placed or performed during the hospital encounter of 01/18/20 (from the past 24 hour(s))  SARS Coronavirus 2 by RT PCR (hospital order, performed in [place] hospital lab) Nasopharyngeal Nasopharyngeal Swab   Collection Time: 01/18/20  7:25 PM   Specimen: Nasopharyngeal Swab  Result Value Ref Range   SARS Coronavirus 2 NEGATIVE NEGATIVE  CBC   Collection Time: 01/18/20  7:28 PM  Result Value Ref Range   WBC 13.5 (H) 4.0 - 10.5 K/uL   RBC 4.19 3.87 - 5.11 MIL/uL   Hemoglobin 12.3 12.0 - 15.0 g/dL   HCT 38.1 36 - 46 %   MCV 90.9 80.0 - 100.0 fL   MCH 29.4 26.0 - 34.0 pg   MCHC 32.3 30.0 - 36.0 g/dL   RDW 14.7 11.5 - 15.5 %   Platelets 350 150 - 400 K/uL   nRBC 0.0 0.0 - 0.2 %  Type and screen [facility]   Collection Time: 01/18/20  7:28 PM  Result Value Ref Range    ABO/RH(D) O POS    Antibody Screen NEG    Sample Expiration      01/21/2020,2359 Performed at [facility], [address] [place], [place] [address]     Patient Active Problem List   Diagnosis Date Noted  . Indication for care in labor or delivery 01/18/2020  . UTI (urinary tract infection) during pregnancy 07/02/2019  . Supervision of other normal pregnancy, antepartum 06/06/2019  . Migraine without aura and   without status migrainosus, not intractable 10/28/2017    Assessment/Plan:  Ocie Doyne Rylei Masella is a 19 y.o. G2P1001 at 49w1dhere for SOL   #Labor: early  #Pain: Epidural  #FWB:  EFM, will continue to monitor   #ID:  None  #MOF: breast #MOC: Paragard #Circ: N/A: girl   Continue to monitor   Anticipate VD   KTonna Corner Student-MidWife  01/18/2020, 10:13 PM  Attestation of Supervision of Student:  I confirm that I have verified the information documented in the nurse midwife student's note and that I have also personally reperformed the history, physical exam and all medical decision making activities.  I have verified that all services and findings are accurately documented in this student's note; and I agree with management and plan as outlined in the documentation. I have also made any necessary editorial changes.   VLajean Manes CCatahoulafor [employer] 01/19/2020 2:03 AM

## 2020-01-18 NOTE — Anesthesia Preprocedure Evaluation (Signed)
Anesthesia Evaluation  Patient identified by MRN, date of birth, ID band Patient awake    Reviewed: Allergy & Precautions, NPO status , Patient's Chart, lab work & pertinent test results  Airway Mallampati: II  TM Distance: >3 FB Neck ROM: Full    Dental no notable dental hx.    Pulmonary neg pulmonary ROS,    Pulmonary exam normal breath sounds clear to auscultation       Cardiovascular negative cardio ROS Normal cardiovascular exam Rhythm:Regular Rate:Normal     Neuro/Psych negative neurological ROS  negative psych ROS   GI/Hepatic negative GI ROS, Neg liver ROS,   Endo/Other  negative endocrine ROSdiabetes, GestationalObese BMI 36  Renal/GU negative Renal ROS  negative genitourinary   Musculoskeletal negative musculoskeletal ROS (+)   Abdominal   Peds  Hematology negative hematology ROS (+)   Anesthesia Other Findings   Reproductive/Obstetrics                             Anesthesia Physical Anesthesia Plan  ASA: III  Anesthesia Plan: Epidural   Post-op Pain Management:    Induction:   PONV Risk Score and Plan: Treatment may vary due to age or medical condition  Airway Management Planned: Natural Airway  Additional Equipment:   Intra-op Plan:   Post-operative Plan:   Informed Consent: I have reviewed the patients History and Physical, chart, labs and discussed the procedure including the risks, benefits and alternatives for the proposed anesthesia with the patient or authorized representative who has indicated his/her understanding and acceptance.       Plan Discussed with: Anesthesiologist  Anesthesia Plan Comments: (Patient identified. Risks, benefits, options discussed with patient including but not limited to bleeding, infection, nerve damage, paralysis, failed block, incomplete pain control, headache, blood pressure changes, nausea, vomiting, reactions to  medication, itching, and post partum back pain. Confirmed with bedside nurse the patient's most recent platelet count. Confirmed with the patient that they are not taking any anticoagulation, have any bleeding history or any family history of bleeding disorders. Patient expressed understanding and wishes to proceed. All questions were answered. )        Anesthesia Quick Evaluation

## 2020-01-18 NOTE — Anesthesia Procedure Notes (Signed)
Epidural Patient location during procedure: OB Start time: 01/18/2020 8:50 PM End time: 01/18/2020 9:05 PM  Staffing Anesthesiologist: Freddrick March, MD Performed: anesthesiologist   Preanesthetic Checklist Completed: patient identified, IV checked, risks and benefits discussed, monitors and equipment checked, pre-op evaluation and timeout performed  Epidural Patient position: sitting Prep: DuraPrep and site prepped and draped Patient monitoring: continuous pulse ox, blood pressure, heart rate and cardiac monitor Approach: midline Location: L3-L4 Injection technique: LOR air  Needle:  Needle type: Tuohy  Needle gauge: 17 G Needle length: 9 cm Needle insertion depth: 5 cm Catheter type: closed end flexible Catheter size: 19 Gauge Catheter at skin depth: 10 cm Test dose: negative  Assessment Sensory level: T8 Events: blood not aspirated, injection not painful, no injection resistance, no paresthesia and negative IV test  Additional Notes Patient identified. Risks/Benefits/Options discussed with patient including but not limited to bleeding, infection, nerve damage, paralysis, failed block, incomplete pain control, headache, blood pressure changes, nausea, vomiting, reactions to medication both or allergic, itching and postpartum back pain. Confirmed with bedside nurse the patient's most recent platelet count. Confirmed with patient that they are not currently taking any anticoagulation, have any bleeding history or any family history of bleeding disorders. Patient expressed understanding and wished to proceed. All questions were answered. Sterile technique was used throughout the entire procedure. Please see nursing notes for vital signs. Test dose was given through epidural catheter and negative prior to continuing to dose epidural or start infusion. Warning signs of high block given to the patient including shortness of breath, tingling/numbness in hands, complete motor block,  or any concerning symptoms with instructions to call for help. Patient was given instructions on fall risk and not to get out of bed. All questions and concerns addressed with instructions to call with any issues or inadequate analgesia.  Reason for block:procedure for pain

## 2020-01-19 ENCOUNTER — Encounter (HOSPITAL_COMMUNITY): Payer: Self-pay | Admitting: Obstetrics and Gynecology

## 2020-01-19 DIAGNOSIS — Z3A39 39 weeks gestation of pregnancy: Secondary | ICD-10-CM

## 2020-01-19 DIAGNOSIS — Z30430 Encounter for insertion of intrauterine contraceptive device: Secondary | ICD-10-CM

## 2020-01-19 LAB — RPR: RPR Ser Ql: NONREACTIVE

## 2020-01-19 MED ORDER — PRENATAL MULTIVITAMIN CH
1.0000 | ORAL_TABLET | Freq: Every day | ORAL | Status: DC
  Administered 2020-01-19 – 2020-01-20 (×2): 1 via ORAL
  Filled 2020-01-19 (×2): qty 1

## 2020-01-19 MED ORDER — ONDANSETRON HCL 4 MG/2ML IJ SOLN: 4.0000 mg | INTRAMUSCULAR | Status: DC | PRN

## 2020-01-19 MED ORDER — COCONUT OIL OIL
1.0000 "application " | TOPICAL_OIL | Status: DC | PRN
  Administered 2020-01-19: 1 via TOPICAL

## 2020-01-19 MED ORDER — ACETAMINOPHEN 325 MG PO TABS
650.0000 mg | ORAL_TABLET | ORAL | Status: DC | PRN
  Administered 2020-01-19 – 2020-01-20 (×3): 650 mg via ORAL
  Filled 2020-01-19 (×3): qty 2

## 2020-01-19 MED ORDER — SIMETHICONE 80 MG PO CHEW: 80.0000 mg | CHEWABLE_TABLET | ORAL | Status: DC | PRN

## 2020-01-19 MED ORDER — DIBUCAINE (PERIANAL) 1 % EX OINT: 1.0000 "application " | TOPICAL_OINTMENT | CUTANEOUS | Status: DC | PRN

## 2020-01-19 MED ORDER — ZOLPIDEM TARTRATE 5 MG PO TABS: 5.0000 mg | ORAL_TABLET | Freq: Every evening | ORAL | Status: DC | PRN

## 2020-01-19 MED ORDER — SENNOSIDES-DOCUSATE SODIUM 8.6-50 MG PO TABS
2.0000 | ORAL_TABLET | ORAL | Status: DC
  Administered 2020-01-19: 2 via ORAL
  Filled 2020-01-19: qty 2

## 2020-01-19 MED ORDER — BENZOCAINE-MENTHOL 20-0.5 % EX AERO
1.0000 "application " | INHALATION_SPRAY | CUTANEOUS | Status: DC | PRN
  Administered 2020-01-20: 1 via TOPICAL
  Filled 2020-01-19: qty 56

## 2020-01-19 MED ORDER — DIPHENHYDRAMINE HCL 25 MG PO CAPS: 25.0000 mg | ORAL_CAPSULE | Freq: Four times a day (QID) | ORAL | Status: DC | PRN

## 2020-01-19 MED ORDER — TETANUS-DIPHTH-ACELL PERTUSSIS 5-2.5-18.5 LF-MCG/0.5 IM SUSP: 0.5000 mL | Freq: Once | INTRAMUSCULAR | Status: DC

## 2020-01-19 MED ORDER — PARAGARD INTRAUTERINE COPPER IU IUD
INTRAUTERINE_SYSTEM | Freq: Once | INTRAUTERINE | Status: AC
  Administered 2020-01-19: 1 via INTRAUTERINE
  Filled 2020-01-19: qty 1

## 2020-01-19 MED ORDER — WITCH HAZEL-GLYCERIN EX PADS: 1.0000 "application " | MEDICATED_PAD | CUTANEOUS | Status: DC | PRN

## 2020-01-19 MED ORDER — ONDANSETRON HCL 4 MG PO TABS: 4.0000 mg | ORAL_TABLET | ORAL | Status: DC | PRN

## 2020-01-19 MED ORDER — IBUPROFEN 600 MG PO TABS
600.0000 mg | ORAL_TABLET | Freq: Four times a day (QID) | ORAL | Status: DC
  Administered 2020-01-19 – 2020-01-20 (×5): 600 mg via ORAL
  Filled 2020-01-19 (×5): qty 1

## 2020-01-19 NOTE — Progress Notes (Signed)
.  Labor Progress Note Robin Strickland is a 19 y.o. G2P1001 at [redacted]w[redacted]d presented for SOL   S: Robin Strickland is quite comfortable with her epidural; would like to be checked and have her membranes ruptured  Had a migraine earlier but it has gone away  She has no questions or concerns at this time   O:  BP 113/63   Pulse 96   Temp 97.7 F (36.5 C) (Oral)   Resp 18   Ht 5\' 1"  (1.549 m)   Wt 85.3 kg   LMP 04/19/2019 (Approximate)   SpO2 100%   BMI 35.54 kg/m  EFM: 155/mod variability/variables   CVE: Dilation: 7 Effacement (%): 80 Cervical Position: Posterior Station: -1 Presentation: Vertex Exam by:: Ricka Burdock, student CNM AROM attempted but no leaking of fluid noted   A&P: 19 y.o. G2P1001 [redacted]w[redacted]d by LMP well supported by [place] 1. Labor: Progressing well. Desires AROM. Will continue to anticipate VD 2. Pain: Managed with epidural  3. FWB: will continue to monitor  4. GBS negative, no treatment needed  5. Will reassess fluid leakage in 15 minutes, after positional change    Nunzio Cory Crowley-Haywood, Student-MidWife 1:35 AM

## 2020-01-19 NOTE — Progress Notes (Signed)
..  Labor Progress Note Robin Strickland is a 19 y.o. G2P1001 at [redacted]w[redacted]d presented for SOL  S:  Tabbetha continues to be comfortable with her epidural  Feeling some pressure now, states urge to stool during contraction [name] has requested a Paragard IUD to be placed post-placentally  Explained the procedure and pros/cons and signatures obtained for consents   O:  BP 109/67   Pulse 89   Temp 97.7 F (36.5 C) (Oral)   Resp 18   Ht 5\' 1"  (1.549 m)   Wt 85.3 kg   LMP 04/19/2019 (Approximate)   SpO2 100%   BMI 35.54 kg/m  EFM: 145/mod var/accels   CVE:  Dilation: 9.5 Effacement: 80 Station: -1 AROM: scant amount of clear fluid    CVE: Dilation: Lip/rim Effacement (%): 80 Cervical Position: Posterior Station: -1 Presentation: Vertex Exam by:: Ruben Im RN   A&P: 19 y.o. G2P1001 106w2d here for SOL  Labor: Progressing well.   Pain: managed with epidural   FWB: will continue to monitor  GBS negative  IUD insertion immediately pp  Tonna Corner, Student-MidWife 3:18 AM

## 2020-01-19 NOTE — Discharge Summary (Signed)
Postpartum Discharge Summary   Patient Name: Robin Strickland DOB: 2001/01/05 MRN: [id number]  Date of admission: 01/18/2020 Delivery date:01/19/2020  Delivering provider: Lajean Manes  Date of discharge: 01/20/2020  Admitting diagnosis: Indication for care in labor or delivery [O75.9] Intrauterine pregnancy: [redacted]w[redacted]d    Secondary diagnosis:  Active Problems:   Indication for care in labor or delivery   SVD (spontaneous vaginal delivery)  Additional problems: none    Discharge diagnosis: Term Pregnancy Delivered                                              Post partum procedures:IUD insertion-Paragard Augmentation: AROM Complications: None  Hospital course: Onset of Labor With Vaginal Delivery      19y.o. yo G2P1001 at 327w2das admitted in Latent Labor on 01/18/2020. Patient had an uncomplicated labor course as follows:  Membrane Rupture Time/Date: 3:13 AM ,01/19/2020   Delivery Method:Vaginal, Spontaneous  Episiotomy: None  Lacerations:  None  Patient had an uncomplicated postpartum course.  She is ambulating, tolerating a regular diet, passing flatus, and urinating well. Patient is discharged home in stable condition on 01/20/20.  Newborn Data: Birth date:01/19/2020  Birth time:5:57 AM  Gender:Female  Living status:Living  Apgars:9 ,9  Weight:3065 g   Magnesium Sulfate received: No BMZ received: No Rhophylac:N/A MMR:N/A T-DaP:Given prenatally Flu: Given prenatally  Transfusion:No  Physical exam  Vitals:   01/19/20 1800 01/19/20 2023 01/19/20 2055 01/20/20 0543  BP: 117/65 114/63  (!) 103/57  Pulse: 85 (!) 101  84  Resp: _0 Temp: 98.1 F (36.7 C)  98.2 F (36.8 C) 97.7 F (36.5 C)  TempSrc: Oral  Oral Oral  SpO2: 98% 98%  99%  Weight:      Height:       General: alert, cooperative and no distress Lochia: appropriate Uterine Fundus: firm Incision: N/A DVT Evaluation: No evidence of DVT seen on physical exam. No cords or calf  tenderness. No significant calf/ankle edema. Labs: Lab Results  Component Value Date   WBC 13.5 (H) 01/18/2020   HGB 12.3 01/18/2020   HCT 38.1 01/18/2020   MCV 90.9 01/18/2020   PLT 350 01/18/2020   CMP Latest Ref Rng & Units 07/11/2019  Glucose 65 - 99 mg/dL 178(H)  BUN 6 - 20 mg/dL 6  Creatinine 0.57 - 1.00 mg/dL 0.57  Sodium 134 - 144 mmol/L 137  Potassium 3.5 - 5.2 mmol/L 3.9  Chloride 96 - 106 mmol/L 101  CO2 20 - 29 mmol/L 20  Calcium 8.7 - 10.2 mg/dL 9.1  Total Protein 6.0 - 8.5 g/dL 7.2  Total Bilirubin 0.0 - 1.2 mg/dL 0.4  Alkaline Phos 39 - 117 IU/L 79  AST 0 - 40 IU/L 19  ALT 0 - 32 IU/L 27   Edinburgh Score: Edinburgh Postnatal Depression Scale Screening Tool 01/19/2020  I have been able to laugh and see the funny side of things. (No Data)     After visit meds:  Allergies as of 01/20/2020   No Known Allergies     Medication List    TAKE these medications   Blood Pressure Kit 1 Device by Does not apply route once a week. To be monitored weekly from home   [place] Units by Does not apply route daily as needed.  ibuprofen 600 MG tablet Commonly known as: ADVIL Take 1 tablet (600 mg total) by mouth every 6 (six) hours.   PrePLUS 27-1 MG Tabs Take 1 tablet by mouth daily.        Discharge home in stable condition Infant Feeding: Breast Infant Disposition:home with mother Discharge instruction: per After Visit Summary and Postpartum booklet. Activity: Advance as tolerated. Pelvic rest for 6 weeks.  Diet: routine diet Future Appointments: No future appointments. Follow up Visit:  [place] [facility] [place] [facility]. Schedule an appointment as soon as possible for a visit in 4 week(s).   Specialty: Obstetrics and Gynecology Why: Make appointment to be seen in 4 weeks for postpartum appointment  Contact information: [place] [phone]               Please schedule this patient for a In person postpartum visit in 4 weeks with the following provider: Any provider. Additional Postpartum F/U:IUD string check   Low risk pregnancy complicated by: nothing Delivery mode:  Vaginal, Spontaneous  Anticipated Birth Control:  PP IUD placed   01/20/2020 Lajean Manes, CNM

## 2020-01-19 NOTE — Procedures (Signed)
Post-Placental IUD Insertion Procedure Note  Patient identified, informed consent signed prior to delivery, signed copy in chart, time out was performed.    Vaginal, labial and perineal areas thoroughly inspected for lacerations. No laceration identified. Paragard IUD grasped with sterile ring forceps. Fundus identified through abdominal wall using non-insertion hand. IUD inserted to fundus with ring forceps. IUD carefully released at the fundus and ring forceps gently removed from vagina.   Strings not cut at time of placement. Patient tolerated procedure well.  Lot # [id number] Expiration Date: [id number]  Patient given post procedure instructions and IUD care card with expiration date.  Patient is asked to keep IUD strings tucked in her vagina until her postpartum follow up visit in 4-6 weeks. Patient advised to abstain from sexual intercourse and pulling on strings before her follow-up visit. Patient verbalized an understanding of the plan of care and agrees.    Lajean Manes, CNM 01/19/20, 6:35 AM

## 2020-01-20 MED ORDER — IBUPROFEN 600 MG PO TABS: 600.0000 mg | ORAL_TABLET | Freq: Four times a day (QID) | ORAL | 0 refills | Status: DC

## 2020-01-20 NOTE — Anesthesia Postprocedure Evaluation (Signed)
Anesthesia Post Note  Patient: Robin Strickland  Procedure(s) Performed: AN AD [place]     Patient location during evaluation: Mother Baby Anesthesia Type: Epidural Level of consciousness: awake and alert and oriented Pain management: pain level controlled Vital Signs Assessment: post-procedure vital signs reviewed and stable Respiratory status: spontaneous breathing and nonlabored ventilation Cardiovascular status: stable Postop Assessment: no headache, no backache, patient able to bend at knees, no signs of nausea or vomiting, adequate PO intake and able to ambulate Anesthetic complications: no   No complications documented.  Last Vitals:  Vitals:   01/19/20 2055 01/20/20 0543  BP:  (!) 103/57  Pulse:  84  Resp:  18  Temp: 36.8 C 36.5 C  SpO2:  99%    Last Pain:  Vitals:   01/20/20 0543  TempSrc: Oral  PainSc: 0-No pain   Pain Goal: Patients Stated Pain Goal: 2 (01/19/20 2300)                 Willa Rough

## 2020-01-21 ENCOUNTER — Telehealth: Payer: Self-pay | Admitting: *Deleted

## 2020-01-21 NOTE — Telephone Encounter (Signed)
Contacted pt to complete transition of care assessment:  Transition Care Management Follow-up Telephone Call   [facility] Managed Care Transition Call Status:MM [facility] Call Made   Date of discharge and from where: [facility], 01/20/20   How have you been since you were released from the hospital? "ok"   Any questions or concerns? No  Items Reviewed:  Did the pt receive and understand the discharge instructions provided? Yes   Medications obtained and verified? Yes ; patient states medication was to be sent to [facility] when she called to check the status, it was not there   Any new allergies since your discharge? No   Dietary orders reviewed?  Yes  Do you have support at home? Yes, family  Functional Questionnaire: (I = Independent and D = Dependent)  ADLs: Independent Bathing/Dressing:Independent Meal Prep: Independent Eating: Independent Maintaining continence: Independent Transferring/Ambulation: Independent Managing Meds: Independent   Follow up appointments reviewed:  PCP Hospital f/u appt confirmed? No  Patient to call Juluis Mire on 01/21/20 to schedule follow up appt  [facility] f/u appt confirmed? Pt to call [facility] [place] [facility] on 01/21/20 to schedule follow up appt  Are transportation arrangements needed? No   If their condition worsens, is the pt aware to call PCP or go to the EmergencyDept.?  Yes  Was the patient provided with contact information for the PCP's office or ED? yes  Was to pt encouraged to call back with questions or concerns? Yes  Spoke with Christy Sartorius, Chartered certified accountant at [employer]. He states they have not received an e-prescription for this patient. Order placed tor Case Management for resolution.  Lenor Coffin, RN, BSN, [place] [facility] [place] [phone]

## 2020-01-23 ENCOUNTER — Encounter: Payer: Medicaid Other | Admitting: Advanced Practice Midwife

## 2020-01-24 ENCOUNTER — Other Ambulatory Visit: Payer: Self-pay | Admitting: Pharmacist

## 2020-01-24 NOTE — Chronic Care Management (AMB) (Unsigned)
Care Coordination - Pharmacy  01/24/2020  Robin Strickland 11-08-2000 [id number]  Subjective:  Robin Strickland is an 19 y.o. year old female who is a primary patient of Robin Strickland.    Robin Strickland was given information about Medicaid Managed Care team care coordination services today. [place] agreed to services and verbal consent obtained  Review of patient status, laboratory and other test data was performed as part of evaluation for provision of services.  SDOH:   SDOH Screenings   Alcohol Screen:   . Last Alcohol Screening Score (AUDIT): Not on file  Depression (PHQ2-9): Medium Risk  . PHQ-2 Score: 13  Financial Resource Strain:   . Difficulty of Paying Living Expenses: Not on file  Food Insecurity:   . Worried About Charity fundraiser in the Last Year: Not on file  . Ran Out of Food in the Last Year: Not on file  Housing:   . Last Housing Risk Score: Not on file  Physical Activity:   . Days of Exercise per Week: Not on file  . Minutes of Exercise per Session: Not on file  Social Connections:   . Frequency of Communication with Friends and Family: Not on file  . Frequency of Social Gatherings with Friends and Family: Not on file  . Attends Religious Services: Not on file  . Active Member of Clubs or Organizations: Not on file  . Attends Archivist Meetings: Not on file  . Marital Status: Not on file  Stress:   . Feeling of Stress : Not on file  Tobacco Use: Low Risk   . Smoking Tobacco Use: Never Smoker  . Smokeless Tobacco Use: Never Used  Transportation Needs:   . Film/video editor (Medical): Not on file  . Lack of Transportation (Non-Medical): Not on file     Objective:  No Known Allergies  Medications:  Medications Reviewed Today    Reviewed by Robin Strickland, Robin Strickland (Pharmacist) on 01/24/20 at 1134  Med List Status: <None>  Medication Order Taking? Sig Documenting  Provider Last Dose Status Informant  Blood Pressure KIT [id number] No 1 Device by Does not apply route once a week. To be monitored weekly from home  Patient not taking: Reported on 01/09/2020   Robin Strickland, CNM Not Taking Active Self  Elastic Bandages & Supports (COMFORT FIT MATERNITY SUPP SM) MISC [id number] No 1 Units by Does not apply route daily as needed. Robin Strickland Taking Active Self  ibuprofen (ADVIL) 600 MG tablet [id number]  Take 1 tablet (600 mg total) by mouth every 6 (six) hours. Robin Strickland, CNM  Active   Prenatal Vit-Fe Fumarate-FA (PREPLUS) 27-1 MG TABS [id number] No Take 1 tablet by mouth daily.  Patient not taking: Reported on 01/19/2020   Robin Macadam, MD Not Taking Unknown time Active Self          Assessment:  Goals Addressed              This Visit's Progress   .  Patient Stated (pt-stated)   On track     Patient stated her goal is to breast feed as long as possible (6-12 months).  With her fist baby she introduced formula too early, and she wasn't able to continue breastfeeding after that.         Plan:  I suggested that Robin Strickland request a breast feeding coach to work with her to ensure she  is prepared to breastfeed successfully as well as pump and store milk.  Patient already has a breast pump and plans to shop for storage supplies on her own.  Patient states that her baby is breastfeeding well and gaining weight.  She received positive feedback yesterday at her baby's well visit.  Follow up with patient about getting supplies.

## 2020-01-24 NOTE — Patient Instructions (Signed)
Patient plans to request a breastfeeding coach to help her learn to store and freeze her breast milk.  She plans to purchase milk storage supplies to use.  She already has a breast pump.

## 2020-01-31 ENCOUNTER — Inpatient Hospital Stay (HOSPITAL_COMMUNITY): Admission: AD | Admit: 2020-01-31 | Payer: Medicaid Other | Source: Home / Self Care | Admitting: Family Medicine

## 2020-01-31 ENCOUNTER — Inpatient Hospital Stay (HOSPITAL_COMMUNITY): Payer: Medicaid Other

## 2020-02-18 ENCOUNTER — Ambulatory Visit: Payer: Medicaid Other | Admitting: Obstetrics & Gynecology

## 2020-02-18 ENCOUNTER — Ambulatory Visit: Payer: Self-pay

## 2020-02-18 NOTE — Lactation Note (Signed)
This note was copied from a baby's chart. Lactation Consultation Note  Patient Name: Robin Strickland Date: 02/18/2020   I received notice from [place], [place] on the Peds unit that infant was inpatient & Mom was pumping & BO & wanting to speak with someone from lactation. I called Mom and talked to the Mom, "[name]," a few times.  Mom's primary concern was that when infant was well, she would latch to the breast and Mom could "hear her smacking" with her lips during feedings. This did not cause Mom discomfort & [name] continued to gain weight well. Per Mom, infant has always smacked at the breast. Mom was wanting to know if we could trouble-shoot while infant was in the hospital.  I spoke with [name], Therapist, sports. Since infant is still on oxygen & she is still showing respiratory symptoms (per RN), it seemed better to recommend a lactation consult on an outpatient basis, once the infant has been discharged from the hospital and has been given the OK to return to the breast.   I gave Mom the phone # for the outpatient lactation clinic, but I also sent them a message through Epic suggesting they make an appt in a couple of weeks (after quarantining was done). I also gave Mom the warmline phone # for lactation in case she has any questions while infant is still admitted or after d/c.   Mom is pumping 6 oz about q3 hrs. She has been using the size 24 flanges, which Mom says is uncomfortable. Mom commented that she had tiny nipples. Mom affirms that it feels like the size 24 flanges are pulling in too much areola. Smaller flanges will be provided.   Mom also had questions about which foods were ok to eat when breastfeeding, which was discussed.   Supplies that I took to the Peds unit to be given to Mom were:  Size 21 flanges (2); basins for cleaning pump parts (with clean toothbrush); and Medela sanitizing spray to be used after washing pump parts.  Matthias Hughs [facility] 02/18/2020, 3:18  PM

## 2020-03-03 ENCOUNTER — Ambulatory Visit: Payer: Medicaid Other | Admitting: Obstetrics & Gynecology

## 2020-03-13 ENCOUNTER — Encounter: Payer: Self-pay | Admitting: Obstetrics and Gynecology

## 2020-03-13 ENCOUNTER — Ambulatory Visit (INDEPENDENT_AMBULATORY_CARE_PROVIDER_SITE_OTHER): Payer: Medicaid Other | Admitting: Obstetrics and Gynecology

## 2020-03-13 ENCOUNTER — Other Ambulatory Visit: Payer: Self-pay

## 2020-03-13 DIAGNOSIS — Z30431 Encounter for routine checking of intrauterine contraceptive device: Secondary | ICD-10-CM

## 2020-03-13 NOTE — Progress Notes (Signed)
.     [place] Partum Visit Note  Robin Strickland is a 19 y.o. [phone] female who presents for a postpartum visit s/p 8/14 SVD/intact perineum; she had immediate placement of paragard  I have fully reviewed the prenatal and intrapartum course. The delivery was at [redacted]w[redacted]d gestational weeks.  Anesthesia: epidural. Postpartum course has been non complicated. Baby is doing well. Baby is feeding by both breast and bottle - Gerber Gentle; she mostly breast feeds and only bottle feeds maybe twice a day. She doesn't have any bleeding and has not had a period. Bowel function is normal. Bladder function is normal. Patient is not sexually active. Contraception method is IUD. Postpartum depression screening: negative.   The pregnancy intention screening data noted above was reviewed. Potential methods of contraception were discussed. The patient elected to proceed with IUD or IUS.    Edinburgh Postnatal Depression Scale - 03/13/20 0819      Edinburgh Postnatal Depression Scale:  In the Past 7 Days   I have been able to laugh and see the funny side of things. 0    I have looked forward with enjoyment to things. 0    I have blamed myself unnecessarily when things went wrong. 0    I have been anxious or worried for no good reason. 0    I have felt scared or panicky for no good reason. 2    Things have been getting on top of me. 1    I have been so unhappy that I have had difficulty sleeping. 0    I have felt sad or miserable. 0    I have been so unhappy that I have been crying. 0    The thought of harming myself has occurred to me. 0    Edinburgh Postnatal Depression Scale Total 3           Review of Systems Pertinent items are noted in HPI.    Objective:  Blood pressure 114/74, pulse 97, weight 173 lb (78.5 kg), unknown if currently breastfeeding. NAD Abdomen: soft, nttp, nd Pelvic EGBUS normal Vagina: normal Cervix: normal. IUD strings white, 4cm, tucked in to the fornices.  nttp Uterus: normal. Small, mobile, nttp.   Assessment:    Normal  postpartum exam.  Plan:  Doing well with no issues. D/w her need for annual and to start paps next year.   RTC: 1 year for annual and pap smear  Aletha Halim, MD [facility]

## 2020-03-25 ENCOUNTER — Other Ambulatory Visit: Payer: Self-pay

## 2020-03-25 ENCOUNTER — Encounter (INDEPENDENT_AMBULATORY_CARE_PROVIDER_SITE_OTHER): Payer: Self-pay | Admitting: Primary Care

## 2020-03-25 ENCOUNTER — Ambulatory Visit (INDEPENDENT_AMBULATORY_CARE_PROVIDER_SITE_OTHER): Payer: Medicaid Other | Admitting: Primary Care

## 2020-03-25 VITALS — BP 115/77 | HR 94 | Temp 97.5°F | Ht 61.0 in | Wt 172.4 lb

## 2020-03-25 DIAGNOSIS — Z0000 Encounter for general adult medical examination without abnormal findings: Secondary | ICD-10-CM | POA: Diagnosis not present

## 2020-03-25 NOTE — Progress Notes (Signed)
Robin Strickland   Established Patient Office Visit  Subjective:  Patient ID: Robin Strickland, female    DOB: 05/22/2001  Age: 19 y.o. MRN: [id number]  CC:  Chief Complaint  Patient presents with  . Annual Exam    HPI [name] is a 19 year old female who presents for physical exam.  Past Medical History:  Diagnosis Date  . Gestational diabetes   . History of gestational diabetes 04/19/2016   2021: early a1c negative.   Marland Kitchen UTI (urinary tract infection) during pregnancy 07/02/2019   _0  needs toc: neg    Past Surgical History:  Procedure Laterality Date  . NO PAST SURGERIES      Family History  Problem Relation Age of Onset  . Healthy Mother   . Healthy Father   . Hypertension Maternal Grandmother   . Kidney disease Maternal Grandmother     Social History   Socioeconomic History  . Marital status: Married    Spouse name: Not on file  . Number of children: Not on file  . Years of education: Not on file  . Highest education level: Not on file  Occupational History  . Not on file  Tobacco Use  . Smoking status: Never Smoker  . Smokeless tobacco: Never Used  Vaping Use  . Vaping Use: Never used  Substance and Sexual Activity  . Alcohol use: No  . Drug use: No  . Sexual activity: Yes    Birth control/protection: None  Other Topics Concern  . Not on file  Social History Narrative  . Not on file   Social Determinants of Health   Financial Resource Strain:   . Difficulty of Paying Living Expenses: Not on file  Food Insecurity:   . Worried About Charity fundraiser in the Last Year: Not on file  . Ran Out of Food in the Last Year: Not on file  Transportation Needs:   . Lack of Transportation (Medical): Not on file  . Lack of Transportation (Non-Medical): Not on file  Physical Activity:   . Days of Exercise per Week: Not on file  . Minutes of Exercise per Session: Not on file  Stress:   . Feeling of Stress : Not on file   Social Connections:   . Frequency of Communication with Friends and Family: Not on file  . Frequency of Social Gatherings with Friends and Family: Not on file  . Attends Religious Services: Not on file  . Active Member of Clubs or Organizations: Not on file  . Attends Archivist Meetings: Not on file  . Marital Status: Not on file  Intimate Partner Violence:   . Fear of Current or Ex-Partner: Not on file  . Emotionally Abused: Not on file  . Physically Abused: Not on file  . Sexually Abused: Not on file    Outpatient Medications Prior to Visit  Medication Sig Dispense Refill  . Blood Pressure KIT 1 Device by Does not apply route once a week. To be monitored weekly from home (Patient not taking: Reported on 01/09/2020) 1 kit 0  . Elastic Bandages & Supports (COMFORT FIT MATERNITY SUPP SM) MISC 1 Units by Does not apply route daily as needed. 1 each 0  . ibuprofen (ADVIL) 600 MG tablet Take 1 tablet (600 mg total) by mouth every 6 (six) hours. (Patient not taking: Reported on 03/13/2020) 30 tablet 0  . Prenatal Vit-Fe Fumarate-FA (PREPLUS) 27-1 MG TABS Take 1 tablet by mouth  daily. (Patient not taking: Reported on 01/19/2020) 30 tablet 13   No facility-administered medications prior to visit.    No Known Allergies  ROS Review of Systems  Neurological:       Migraines has an appointment with neurologist       Objective:    Physical Exam Vitals reviewed.  Constitutional:      Appearance: She is obese.  HENT:     Head: Normocephalic.     Ears:     Comments: Dried blood - uses Q-tips counseled on not placing anything in your ear except your elbow     Nose: Nose normal.  Cardiovascular:     Rate and Rhythm: Normal rate and regular rhythm.  Pulmonary:     Effort: Pulmonary effort is normal.     Breath sounds: Normal breath sounds.  Abdominal:     General: Bowel sounds are normal.     Palpations: Abdomen is soft.  Musculoskeletal:        General: Normal range of  motion.     Cervical back: Normal range of motion and neck supple.  Skin:    General: Skin is warm and dry.  Neurological:     Mental Status: She is alert and oriented to person, place, and time.  Psychiatric:        Mood and Affect: Mood normal.        Behavior: Behavior normal.        Thought Content: Thought content normal.        Judgment: Judgment normal.     BP 115/77 (BP Location: Right Arm, Patient Position: Sitting, Cuff Size: Normal)   Pulse 94   Temp (!) 97.5 F (36.4 C) (Temporal)   Ht _0  (1.549 m)   Wt 172 lb 6.4 oz (78.2 kg)   SpO2 97%   Breastfeeding Yes   BMI 32.57 kg/m  Wt Readings from Last 3 Encounters:  03/25/20 172 lb 6.4 oz (78.2 kg)  03/13/20 173 lb (78.5 kg)  01/18/20 188 lb 1.6 oz (85.3 kg) (96 %, Z= 1.74)*   * Growth percentiles are based on CDC (Girls, 2-20 Years) data.     Health Maintenance Due  Topic Date Due  . Hepatitis C Screening  Never done  . COVID-19 Vaccine (1) Never done  . INFLUENZA VACCINE  01/06/2020    There are no preventive care reminders to display for this patient.  Lab Results  Component Value Date   TSH 0.586 07/11/2019   Lab Results  Component Value Date   WBC 13.5 (H) 01/18/2020   HGB 12.3 01/18/2020   HCT 38.1 01/18/2020   MCV 90.9 01/18/2020   PLT 350 01/18/2020   Lab Results  Component Value Date   NA 137 07/11/2019   K 3.9 07/11/2019   CO2 20 07/11/2019   GLUCOSE 178 (H) 07/11/2019   BUN 6 07/11/2019   CREATININE 0.57 07/11/2019   BILITOT 0.4 07/11/2019   ALKPHOS 79 07/11/2019   AST 19 07/11/2019   ALT 27 07/11/2019   PROT 7.2 07/11/2019   ALBUMIN 4.0 07/11/2019   CALCIUM 9.1 07/11/2019   No results found for: CHOL No results found for: HDL No results found for: LDLCALC No results found for: TRIG No results found for: CHOLHDL Lab Results  Component Value Date   HGBA1C 5.3 07/11/2019      Assessment & Plan:  Robin Strickland was seen today for annual exam.  Diagnoses and all orders for  this visit:  Annual  physical exam Completed    No orders of the defined types were placed in this encounter.   Follow-up: Return in about 1 year (around 03/25/2021).    Kerin Perna, NP

## 2020-03-25 NOTE — Patient Instructions (Signed)

## 2020-04-04 ENCOUNTER — Ambulatory Visit: Payer: Medicaid Other | Admitting: Nurse Practitioner

## 2020-04-10 DIAGNOSIS — H5213 Myopia, bilateral: Secondary | ICD-10-CM | POA: Diagnosis not present

## 2020-04-11 ENCOUNTER — Encounter: Payer: Medicaid Other | Admitting: Physician Assistant

## 2020-05-12 ENCOUNTER — Other Ambulatory Visit (INDEPENDENT_AMBULATORY_CARE_PROVIDER_SITE_OTHER): Payer: Self-pay | Admitting: Primary Care

## 2020-05-12 NOTE — Telephone Encounter (Signed)
Requested medication (s) are due for refill today: Yes  Requested medication (s) are on the active medication list: No  Last refill:  1 year ago  Future visit scheduled: No  Notes to clinic:  Unable to refill per protocol, not on med list, d/c at discharge from hospital     Requested Prescriptions  Pending Prescriptions Disp Refills   ibuprofen (ADVIL) 600 MG tablet [Pharmacy Med Name: IBUPROFEN 600 MG ORAL TABLET] 60 tablet 1    Sig: TAKE 1 TABLET (600 MG TOTAL) BY MOUTH EVERY 8 (EIGHT) HOURS AS NEEDED.      Analgesics:  NSAIDS Passed - 05/12/2020  5:47 PM      Passed - Cr in normal range and within 360 days    Creatinine, Ser  Date Value Ref Range Status  07/11/2019 0.57 0.57 - 1.00 mg/dL Final          Passed - HGB in normal range and within 360 days    Hemoglobin  Date Value Ref Range Status  01/18/2020 12.3 12.0 - 15.0 g/dL Final  10/12/2019 11.6 11.1 - 15.9 g/dL Final          Passed - Patient is not pregnant      Passed - Valid encounter within last 12 months    Recent Outpatient Visits           1 month ago Annual physical exam   [place] Juluis Mire P, NP   1 year ago Nausea and vomiting, intractability of vomiting not specified, unspecified vomiting type   [place] [facility] Kerin Perna, NP   1 year ago Vaginal odor   [place], NP   1 year ago Sebaceous adenoma of face   [place] Kerin Perna, NP   1 year ago Migraine without aura and without status migrainosus, not intractable   [place] Kerin Perna, NP

## 2020-05-19 ENCOUNTER — Ambulatory Visit (INDEPENDENT_AMBULATORY_CARE_PROVIDER_SITE_OTHER): Payer: Self-pay | Admitting: *Deleted

## 2020-05-19 ENCOUNTER — Telehealth (INDEPENDENT_AMBULATORY_CARE_PROVIDER_SITE_OTHER): Payer: Self-pay | Admitting: Primary Care

## 2020-05-19 ENCOUNTER — Other Ambulatory Visit (INDEPENDENT_AMBULATORY_CARE_PROVIDER_SITE_OTHER): Payer: Self-pay | Admitting: Primary Care

## 2020-05-19 DIAGNOSIS — G43009 Migraine without aura, not intractable, without status migrainosus: Secondary | ICD-10-CM

## 2020-05-19 NOTE — Telephone Encounter (Signed)
Pt states that 600 Ibuprofen is not strong enough, patient says her headaches last for days. Extreme nausea, sometimes cannot work. Transferred to Triage!   [place], [place] - [address]  [place] [address]  Phone: [phone] Fax: [phone]

## 2020-05-19 NOTE — Telephone Encounter (Signed)
Patient states she was given Ibuprofen for her headaches- it is not working and patient states she is missing work at times due to migraines. She would like to discuss other treatment options.  Reason for Disposition  Headache is a chronic symptom (recurrent or ongoing AND present > 4 weeks)  Answer Assessment - Initial Assessment Questions 1. LOCATION: "Where does it hurt?"      Forehead- R or L sides of head 2. ONSET: "When did the headache start?" (Minutes, hours or days)      No headache- last headache last week 3. PATTERN: "Does the pain come and go, or has it been constant since it started?"     Comes and goes- but can last up to 2 days 4. SEVERITY: "How bad is the pain?" and "What does it keep you from doing?"  (e.g., Scale 1-10; mild, moderate, or severe)   - MILD (1-3): doesn't interfere with normal activities    - MODERATE (4-7): interferes with normal activities or awakens from sleep    - SEVERE (8-10): excruciating pain, unable to do any normal activities        Moderate/severe 5. RECURRENT SYMPTOM: "Have you ever had headaches before?" If Yes, ask: "When was the last time?" and "What happened that time?"      Patient has migraine history 6. CAUSE: "What do you think is causing the headache?"     Food can cause it sometimes 7. MIGRAINE: "Have you been diagnosed with migraine headaches?" If Yes, ask: "Is this headache similar?"      yes 8. HEAD INJURY: "Has there been any recent injury to the head?"      no 9. OTHER SYMPTOMS: "Do you have any other symptoms?" (fever, stiff neck, eye pain, sore throat, cold symptoms)     Nausea, vision changes with headache 10. PREGNANCY: "Is there any chance you are pregnant?" "When was your last menstrual period?"       No- LMP- IUD  Protocols used: HEADACHE-A-AH

## 2020-05-19 NOTE — Telephone Encounter (Signed)
Please advise 

## 2020-05-19 NOTE — Telephone Encounter (Signed)
Patient is requesting something stronger for migraine headaches- Ibuprofen does not help. Appointment has been scheduled in the office.

## 2020-05-20 NOTE — Telephone Encounter (Signed)
Patient aware that PCP can not prescribe anything stronger. Referred to neurology.

## 2020-05-26 ENCOUNTER — Telehealth (INDEPENDENT_AMBULATORY_CARE_PROVIDER_SITE_OTHER): Payer: Medicaid Other | Admitting: Primary Care

## 2020-05-26 DIAGNOSIS — G43009 Migraine without aura, not intractable, without status migrainosus: Secondary | ICD-10-CM

## 2020-05-26 MED ORDER — IBUPROFEN 600 MG PO TABS: 600.0000 mg | ORAL_TABLET | Freq: Three times a day (TID) | ORAL | 1 refills | Status: DC | PRN

## 2020-05-26 NOTE — Progress Notes (Signed)
Telephone Note  I connected with Robin Strickland on 05/26/20 at  3:50 PM EST by telephone and verified that I am speaking with the correct person using two identifiers.  Location: Patient: home  Provider: Milford Cage Sereniti Wan_0     I discussed the limitations, risks, security and privacy concerns of performing an evaluation and management service by telephone and the availability of in person appointments. I also discussed with the patient that there may be a patient responsible charge related to this service. The patient expressed understanding and agreed to proceed.   History of Present Illness: MsDairys Venetia Night Talina Strickland is a 19 year old female suffering from migraines . She admits to an aura sensitivity to light and nausea. Previous visit refer to neurology.    Past Medical History:  Diagnosis Date   Gestational diabetes    History of gestational diabetes 04/19/2016   2021: early a1c negative.    UTI (urinary tract infection) during pregnancy 07/02/2019   _1  needs toc: neg   Current Outpatient Medications on File Prior to Visit  Medication Sig Dispense Refill   Blood Pressure KIT 1 Device by Does not apply route once a week. To be monitored weekly from home (Patient not taking: Reported on 01/09/2020) 1 kit 0   Elastic Bandages & Supports (COMFORT FIT MATERNITY SUPP SM) MISC 1 Units by Does not apply route daily as needed. 1 each 0   No current facility-administered medications on file prior to visit.   Observations/Objective: Review of Systems  Neurological: Positive for headaches.  All other systems reviewed and are negative.   Assessment and Plan: Diagnoses and all orders for this visit:  Migraine without aura and without status migrainosus, not intractable -     ibuprofen (ADVIL) 600 MG tablet; Take 1 tablet (600 mg total) by mouth every 8 (eight) hours as needed for headache.    Follow Up Instructions:    I discussed the assessment and  treatment plan with the patient. The patient was provided an opportunity to ask questions and all were answered. The patient agreed with the plan and demonstrated an understanding of the instructions.   The patient was advised to call back or seek an in-person evaluation if the symptoms worsen or if the condition fails to improve as anticipated.  I provided 15 minutes of non-face-to-face time during this encounter.   Kerin Perna, NP

## 2020-06-23 IMAGING — US US MFM OB COMP +14 WKS
1 series · 13 of 28 positions shown · non-contrast
Comparison: none

[Series 1: us mfm ob comp +14 wks · 13 of 145 slices shown]
[im 6/145]
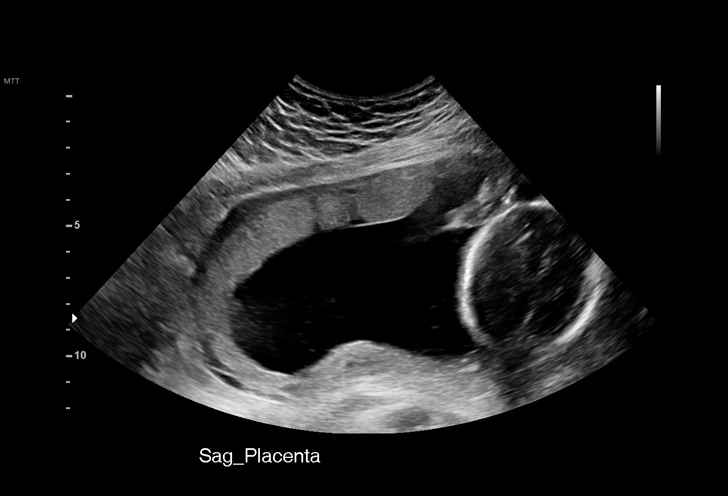
[im 17/145]
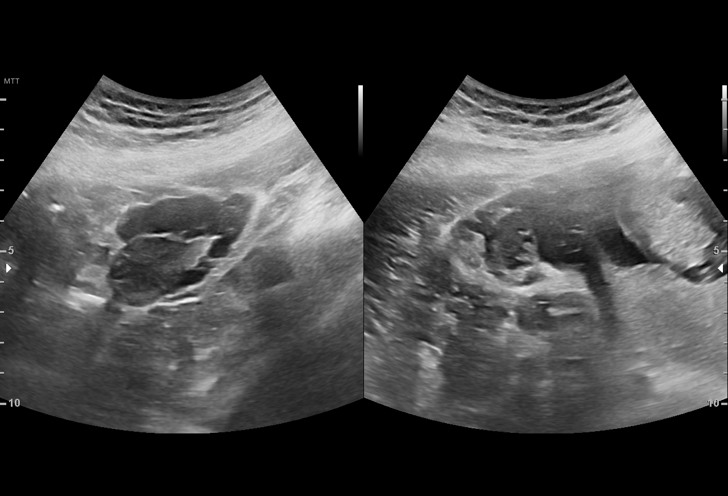
[im 27/145]
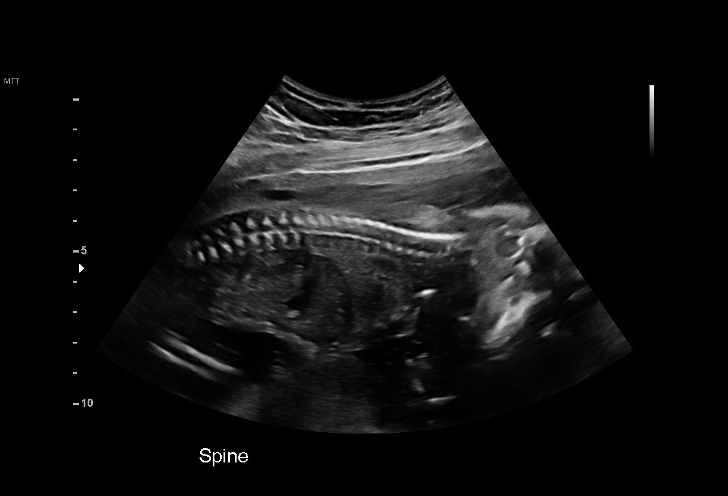
[im 38/145]
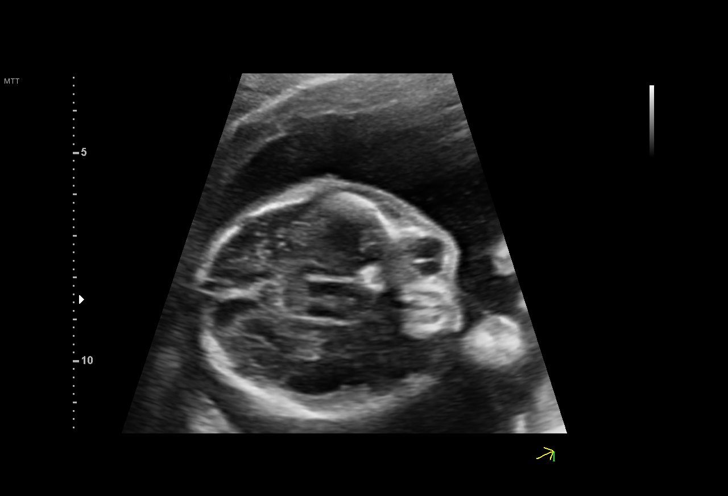
[im 49/145]
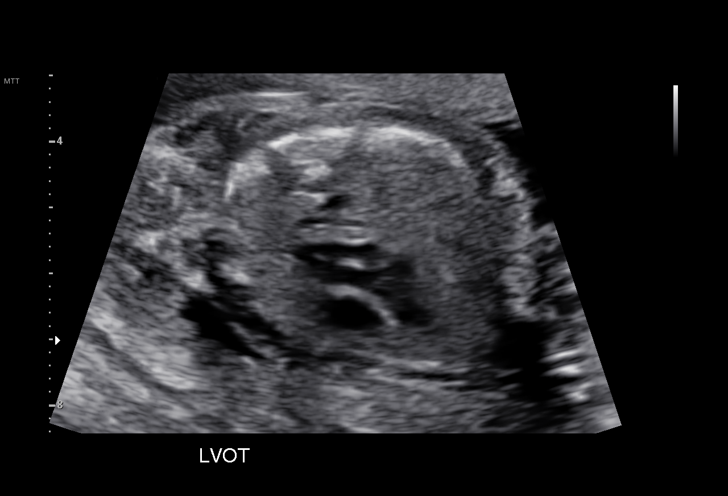
[im 59/145]
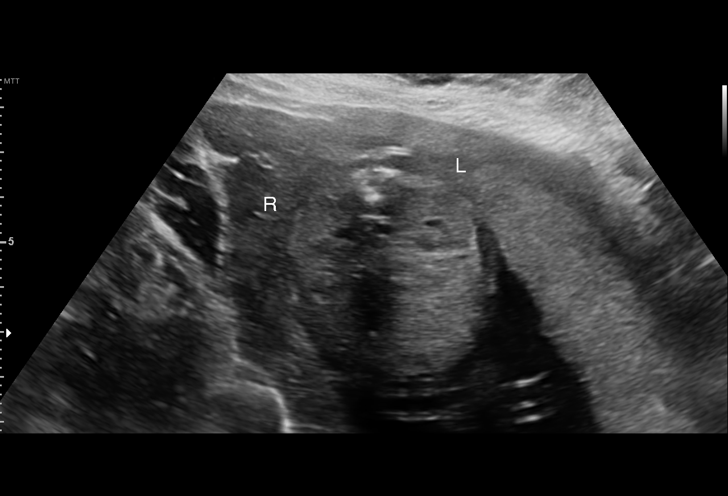
[im 75/145]
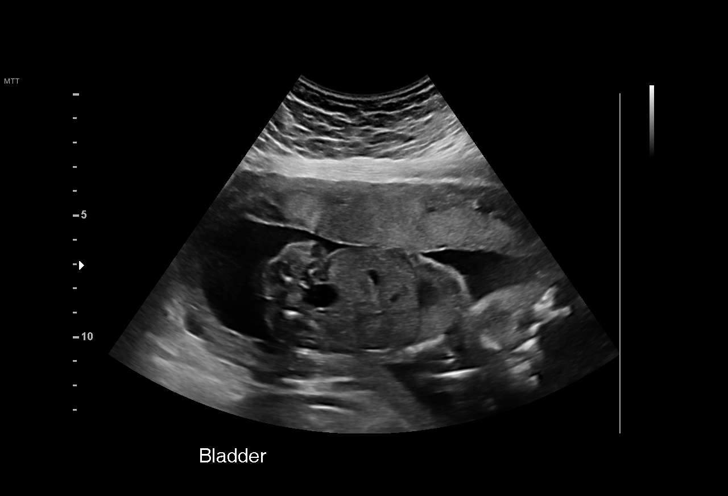
[im 86/145]
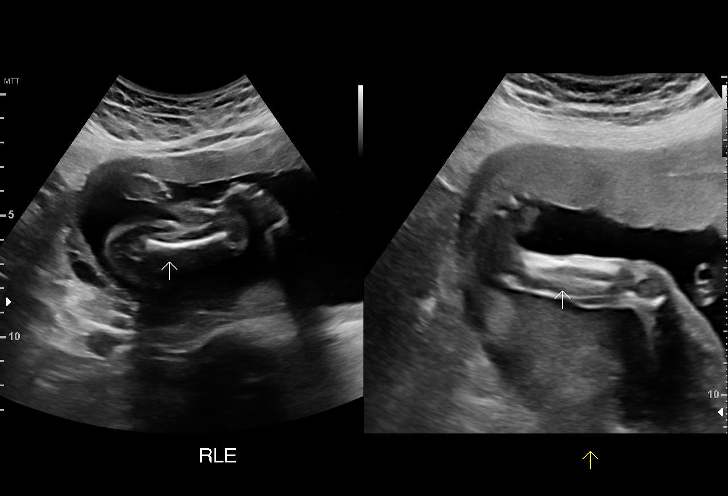
[im 97/145]
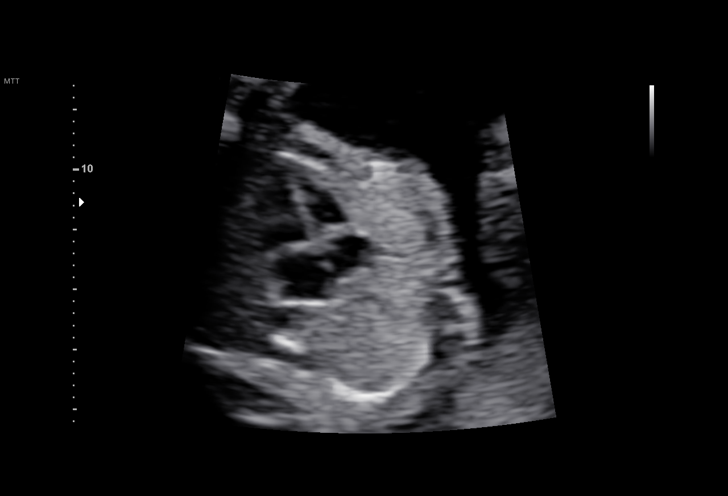
[im 107/145]
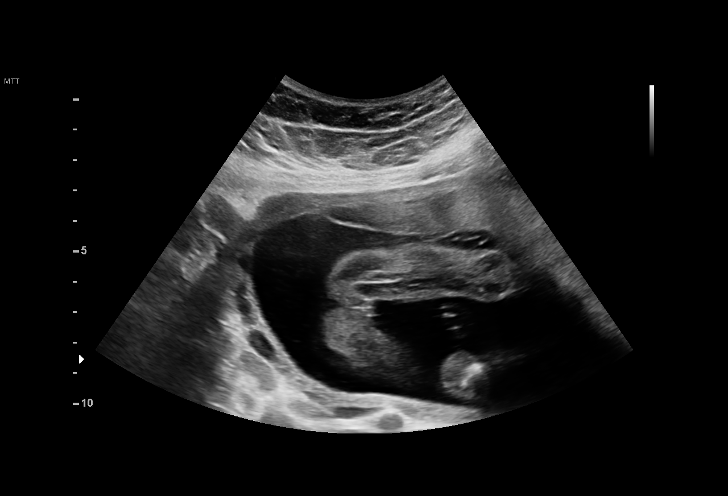
[im 118/145]
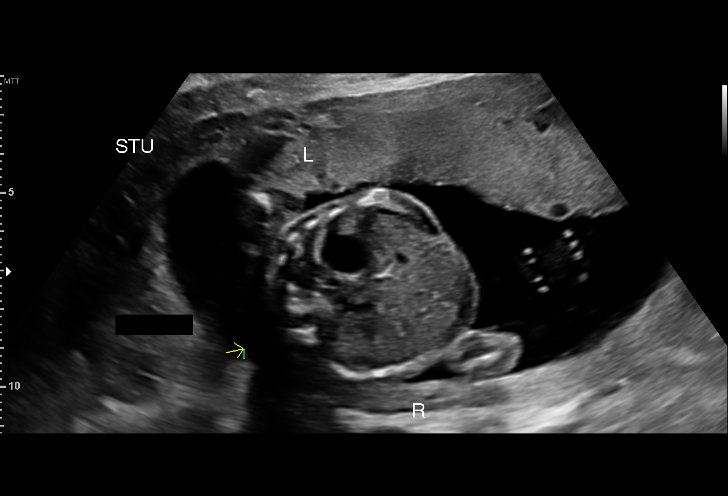
[im 129/145]
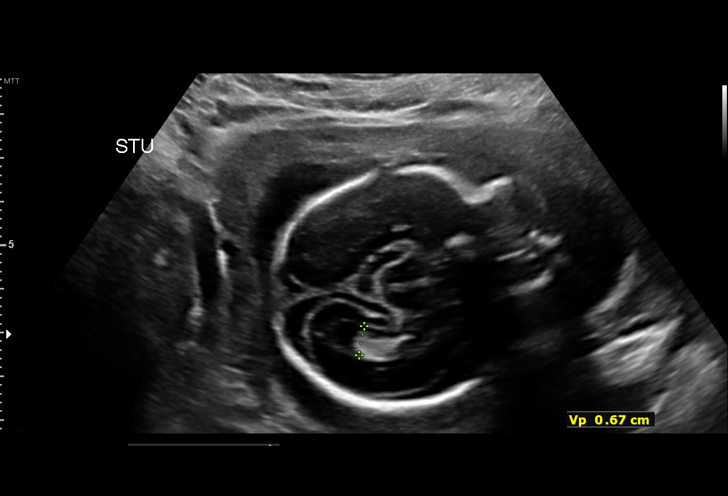
[im 139/145]
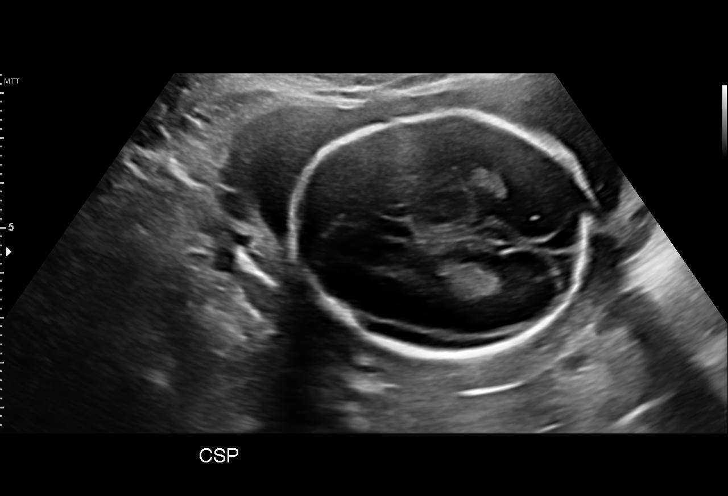

[13 of 28 positions shown; findings below may reference images not displayed]

[REDACTED]

                                                       [place]
 ----------------------------------------------------------------------

 ----------------------------------------------------------------------
Indications

  21 weeks gestation of pregnancy
  Encounter for antenatal screening for
  malformations
  Poor obstetric history: Previous gestational
  diabetes
 ----------------------------------------------------------------------
Fetal Evaluation

 Num Of Fetuses:         1
 Fetal Heart Rate(bpm):  157
 Cardiac Activity:       Observed
 Presentation:           Cephalic
 Placenta:               Anterior Fundal
 P. Cord Insertion:      Visualized, central

 Amniotic Fluid
 AFI FV:      Within normal limits

                             Largest Pocket(cm)

Biometry

 BPD:      54.4  mm     G. Age:  22w 4d         92  %    CI:        78.58   %    70 - 86
                                                         FL/HC:      18.0   %    15.9 -
 HC:      194.1  mm     G. Age:  21w 4d         62  %    HC/AC:      1.19        1.06 -
 AC:      163.6  mm     G. Age:  21w 3d         52  %    FL/BPD:     64.3   %
 FL:         35  mm     G. Age:  21w 0d         37  %    FL/AC:      21.4   %    20 - 24
 HUM:      32.7  mm     G. Age:  21w 0d         43  %
 CER:      22.1  mm     G. Age:  21w 0d         46  %
 LV:        8.7  mm
 CM:        6.3  mm
 Est. FW:     417  gm    0 lb 15 oz      56  %
OB History

 Blood Type:    O+
 Gravidity:    2         Term:   1        Prem:   0        SAB:   0
 TOP:          0       Ectopic:  0        Living: 1
Gestational Age

 LMP:           21w 1d        Date:  04/19/19                 EDD:   01/24/20
 U/S Today:     21w 5d                                        EDD:   01/20/20
 Best:          21w 1d     Det. By:  LMP  (04/19/19)          EDD:   01/24/20
Anatomy

 Cranium:               Appears normal         Aortic Arch:            Appears normal
 Cavum:                 Appears normal         Ductal Arch:            Appears normal
 Ventricles:            Appears normal         Diaphragm:              Appears normal
 Choroid Plexus:        Appears normal         Stomach:                Appears normal, left
                                                                       sided
 Cerebellum:            Appears normal         Abdomen:                Appears normal
 Posterior Fossa:       Appears normal         Abdominal Wall:         Appears nml (cord
                                                                       insert, abd wall)
 Nuchal Fold:           Not applicable (>20    Cord Vessels:           Appears normal (3
                        wks GA)                                        vessel cord)
 Face:                  Appears normal         Kidneys:                Appear normal
                        (orbits and profile)
 Lips:                  Appears normal         Bladder:                Appears normal
 Thoracic:              Appears normal         Spine:                  Appears normal
 Heart:                 Appears normal         Upper Extremities:      Appears normal
                        (4CH, axis, and
                        situs)
 RVOT:                  Appears normal         Lower Extremities:      Appears normal
 LVOT:                  Appears normal

 Other:  Female gender Heels and Right 5th digit visualized. Right Open hand
         visualized. Nasal bone visualized. Technically difficult due to fetal
         position.
Cervix Uterus Adnexa

 Cervix
 Length:           3.84  cm.
 Normal appearance by transabdominal scan.

 Uterus
 No abnormality visualized.

 Left Ovary
 Within normal limits. No adnexal mass visualized.

 Right Ovary
 Within normal limits. No adnexal mass visualized.

 Cul De Sac
 No free fluid seen.

 Adnexa
 No abnormality visualized.
Impression

 We performed fetal anatomy scan. No makers of
 aneuploidies or fetal structural defects are seen. Fetal
 biometry is consistent with her previously-established dates.
 Amniotic fluid is normal and good fetal activity is seen.
 Patient understands the limitations of ultrasound in detecting
 fetal anomalies.

 On cell-free fetal DNA screening, the risks of fetal
 aneuploidies are not increased.

 Obstetric history significant for a term vaginal delivery in 7716
 of a female infant.  Her pregnancy was complicated by
 gestational diabetes.
Recommendations

 Follow-up scans as clinically indicated.
                 SUPUN

## 2020-06-30 ENCOUNTER — Other Ambulatory Visit (INDEPENDENT_AMBULATORY_CARE_PROVIDER_SITE_OTHER): Payer: Self-pay | Admitting: Primary Care

## 2020-06-30 DIAGNOSIS — G43009 Migraine without aura, not intractable, without status migrainosus: Secondary | ICD-10-CM

## 2020-07-07 ENCOUNTER — Ambulatory Visit (INDEPENDENT_AMBULATORY_CARE_PROVIDER_SITE_OTHER): Payer: Medicaid Other | Admitting: Primary Care

## 2020-07-31 ENCOUNTER — Ambulatory Visit: Payer: Medicaid Other | Admitting: Neurology

## 2020-07-31 ENCOUNTER — Other Ambulatory Visit: Payer: Self-pay

## 2020-07-31 ENCOUNTER — Encounter: Payer: Self-pay | Admitting: Neurology

## 2020-07-31 VITALS — BP 106/64 | HR 85 | Ht 61.0 in | Wt 172.5 lb

## 2020-07-31 DIAGNOSIS — G43709 Chronic migraine without aura, not intractable, without status migrainosus: Secondary | ICD-10-CM | POA: Insufficient documentation

## 2020-07-31 MED ORDER — NORTRIPTYLINE HCL 10 MG PO CAPS: 20.0000 mg | ORAL_CAPSULE | Freq: Every day | ORAL | 11 refills | Status: DC

## 2020-07-31 MED ORDER — RIZATRIPTAN BENZOATE 10 MG PO TBDP: 10.0000 mg | ORAL_TABLET | ORAL | 6 refills | Status: DC | PRN

## 2020-07-31 MED ORDER — ONDANSETRON 4 MG PO TBDP: 4.0000 mg | ORAL_TABLET | Freq: Three times a day (TID) | ORAL | 6 refills | Status: DC | PRN

## 2020-07-31 NOTE — Patient Instructions (Signed)
Magnesium oxide 400 mg twice a day plus riboflavin= vitamin B2 100 mg twice a day as preventive medication

## 2020-07-31 NOTE — Progress Notes (Signed)
Chief Complaint  Patient presents with  . New Patient (Initial Visit)    Reports having 3-4 headache days each week. The pain varies in severity. She has tried ibuprofen and she has taken as much as 1200mg  at a time. She often has nausea, sometimes vomiting. She has never tried any preventive or rescue medication in the past. She feels her headaches may be related to her IUD that was place in August 2021.     HISTORICAL  Robin Strickland is a 20 year old female, seen in request by her primary care nurse practitioner Juluis Mire for evaluation of chronic migraine headache initial evaluation was on July 31, 2020  I reviewed and summarized the referring note.  Reported long history of migraine headaches, especially since her first born in 2018, she was put on Nexplanon implant, reported significant increase of her migraine headaches,  She described her typical migraine headache as lateralized severe pounding headache with associated light noise smell sensitivity, nauseous, sometimes frequent vomiting, lasting hours to days,  Trigger for migraine are stress, sleep deprivation, dehydration, missing her meals, weather changes, and hormonal changes,  Because of frequent headaches, she changed her contraceptives from every 3 months implant Nexplanon to oral contraceptives, continue have frequent migraine headaches,  Her migraine has improved during her pregnancy, then began to have frequent headaches again following her delivery in August 2021, she now uses IUD,  She is having migraine 1-2 times each week, taking up to ibuprofen 600 mg 2 tablets with limited help, previously tried Excedrin Migraine without significant improvement  REVIEW OF SYSTEMS: Full 14 system review of systems performed and notable only for as above All other review of systems were negative.  ALLERGIES: No Known Allergies  HOME MEDICATIONS: Current Outpatient Medications  Medication Sig  Dispense Refill  . ibuprofen (ADVIL) 600 MG tablet TAKE 1 TABLET (600 MG TOTAL) BY MOUTH EVERY 8 (EIGHT) HOURS AS NEEDED FOR HEADACHE. 90 tablet 1   No current facility-administered medications for this visit.    PAST MEDICAL HISTORY: Past Medical History:  Diagnosis Date  . Gestational diabetes    During 1st pregnancy.  . Headache   . History of gestational diabetes 04/19/2016   2021: early a1c negative.   Marland Kitchen UTI (urinary tract infection) during pregnancy 07/02/2019   [ ]  needs toc: neg    PAST SURGICAL HISTORY: Past Surgical History:  Procedure Laterality Date  . NO PAST SURGERIES      FAMILY HISTORY: Family History  Problem Relation Age of Onset  . Hypertension Mother   . Kidney disease Mother   . Other Father        unsure of medical history  . Hypertension Maternal Grandmother   . Kidney disease Maternal Grandmother     SOCIAL HISTORY: Social History   Socioeconomic History  . Marital status: Married    Spouse name: Not on file  . Number of children: 2  . Years of education: 25  . Highest education level: High school graduate  Occupational History  . Occupation: Occupational psychologist  Tobacco Use  . Smoking status: Never Smoker  . Smokeless tobacco: Never Used  Vaping Use  . Vaping Use: Never used  Substance and Sexual Activity  . Alcohol use: No  . Drug use: No  . Sexual activity: Yes    Birth control/protection: I.U.D.  Other Topics Concern  . Not on file  Social History Narrative   Lives at home with family.   Right-handed.   No  daily use of caffeine, occasional iced coffee or soda.   Social Determinants of Health   Financial Resource Strain: Not on file  Food Insecurity: Not on file  Transportation Needs: Not on file  Physical Activity: Not on file  Stress: Not on file  Social Connections: Not on file  Intimate Partner Violence: Not on file     PHYSICAL EXAM   Vitals:   07/31/20 0934  BP: 106/64  Pulse: 85  Weight: 172 lb 8 oz (78.2 kg)   Height: 5\' 1"  (1.549 m)   Not recorded     Body mass index is 32.59 kg/m.  PHYSICAL EXAMNIATION:  Gen: NAD, conversant, well nourised, well groomed                     Cardiovascular: Regular rate rhythm, no peripheral edema, warm, nontender. Eyes: Conjunctivae clear without exudates or hemorrhage Neck: Supple, no carotid bruits. Pulmonary: Clear to auscultation bilaterally   NEUROLOGICAL EXAM:  MENTAL STATUS: Speech:    Speech is normal; fluent and spontaneous with normal comprehension.  Cognition:     Orientation to time, place and person     Normal recent and remote memory     Normal Attention span and concentration     Normal Language, naming, repeating,spontaneous speech     Fund of knowledge   CRANIAL NERVES: CN II: Visual fields are full to confrontation. Pupils are round equal and briskly reactive to light. CN III, IV, VI: extraocular movement are normal. No ptosis. CN V: Facial sensation is intact to light touch CN VII: Face is symmetric with normal eye closure  CN VIII: Hearing is normal to causal conversation. CN IX, X: Phonation is normal. CN XI: Head turning and shoulder shrug are intact  MOTOR: There is no pronator drift of out-stretched arms. Muscle bulk and tone are normal. Muscle strength is normal.  REFLEXES: Reflexes are 2+ and symmetric at the biceps, triceps, knees, and ankles. Plantar responses are flexor.  SENSORY: Intact to light touch, pinprick and vibratory sensation are intact in fingers and toes.  COORDINATION: There is no trunk or limb dysmetria noted.  GAIT/STANCE: Posture is normal. Gait is steady with normal steps, base, arm swing, and turning. Heel and toe walking are normal. Tandem gait is normal.  Romberg is absent.   DIAGNOSTIC DATA (LABS, IMAGING, TESTING) - I reviewed patient records, labs, notes, testing and imaging myself where available.   ASSESSMENT AND PLAN  Shruti Vernette Moise is a 20 y.o. female    Chronic migraine without aura  Start preventive medication nortriptyline 10 titrating to 20 mg every night as preventive medications  Maxalt 10 mg as needed, may combine with Zofran   Marcial Pacas, M.D. Ph.D.  [facility] [address] [place], [place] [address] Ph: [phone] Fax: [phone]  CC:  Kerin Perna,  [place],  [place] [address]

## 2020-10-09 ENCOUNTER — Ambulatory Visit: Payer: Medicaid Other | Admitting: Obstetrics and Gynecology

## 2020-10-29 ENCOUNTER — Encounter: Payer: Self-pay | Admitting: Neurology

## 2020-10-29 ENCOUNTER — Ambulatory Visit: Payer: Medicaid Other | Admitting: Neurology

## 2020-10-29 NOTE — Progress Notes (Deleted)
No chief complaint on file.   HISTORICAL  Robin Strickland is a 20 year old female, seen in request by her primary care nurse practitioner Juluis Mire for evaluation of chronic migraine headache initial evaluation was on July 31, 2020  I reviewed and summarized the referring note.  Reported long history of migraine headaches, especially since her first born in 2018, she was put on Nexplanon implant, reported significant increase of her migraine headaches,  She described her typical migraine headache as lateralized severe pounding headache with associated light noise smell sensitivity, nauseous, sometimes frequent vomiting, lasting hours to days,  Trigger for migraine are stress, sleep deprivation, dehydration, missing her meals, weather changes, and hormonal changes,  Because of frequent headaches, she changed her contraceptives from every 3 months implant Nexplanon to oral contraceptives, continue have frequent migraine headaches,  Her migraine has improved during her pregnancy, then began to have frequent headaches again following her delivery in August 2021, she now uses IUD,  She is having migraine 1-2 times each week, taking up to ibuprofen 600 mg 2 tablets with limited help, previously tried Excedrin Migraine without significant improvement  Update Oct 29, 2020 SS:   REVIEW OF SYSTEMS: Full 14 system review of systems performed and notable only for as above All other review of systems were negative.  ALLERGIES: No Known Allergies  HOME MEDICATIONS: Current Outpatient Medications  Medication Sig Dispense Refill  . ibuprofen (ADVIL) 600 MG tablet TAKE 1 TABLET (600 MG TOTAL) BY MOUTH EVERY 8 (EIGHT) HOURS AS NEEDED FOR HEADACHE. 90 tablet 1  . nortriptyline (PAMELOR) 10 MG capsule Take 2 capsules (20 mg total) by mouth at bedtime. 60 capsule 11  . ondansetron (ZOFRAN ODT) 4 MG disintegrating tablet Take 1 tablet (4 mg total) by mouth every 8 (eight)  hours as needed. 20 tablet 6  . rizatriptan (MAXALT-MLT) 10 MG disintegrating tablet Take 1 tablet (10 mg total) by mouth as needed. May repeat in 2 hours if needed 15 tablet 6   No current facility-administered medications for this visit.    PAST MEDICAL HISTORY: Past Medical History:  Diagnosis Date  . Gestational diabetes    During 1st pregnancy.  . Headache   . History of gestational diabetes 04/19/2016   2021: early a1c negative.   Marland Kitchen UTI (urinary tract infection) during pregnancy 07/02/2019   [ ]  needs toc: neg    PAST SURGICAL HISTORY: Past Surgical History:  Procedure Laterality Date  . NO PAST SURGERIES      FAMILY HISTORY: Family History  Problem Relation Age of Onset  . Hypertension Mother   . Kidney disease Mother   . Other Father        unsure of medical history  . Hypertension Maternal Grandmother   . Kidney disease Maternal Grandmother     SOCIAL HISTORY: Social History   Socioeconomic History  . Marital status: Married    Spouse name: Not on file  . Number of children: 2  . Years of education: 58  . Highest education level: High school graduate  Occupational History  . Occupation: Occupational psychologist  Tobacco Use  . Smoking status: Never Smoker  . Smokeless tobacco: Never Used  Vaping Use  . Vaping Use: Never used  Substance and Sexual Activity  . Alcohol use: No  . Drug use: No  . Sexual activity: Yes    Birth control/protection: I.U.D.  Other Topics Concern  . Not on file  Social History Narrative   Lives at home  with family.   Right-handed.   No daily use of caffeine, occasional iced coffee or soda.   Social Determinants of Health   Financial Resource Strain: Not on file  Food Insecurity: Not on file  Transportation Needs: Not on file  Physical Activity: Not on file  Stress: Not on file  Social Connections: Not on file  Intimate Partner Violence: Not on file     PHYSICAL EXAM   There were no vitals filed for this visit. Not  recorded     There is no height or weight on file to calculate BMI.  PHYSICAL EXAMNIATION:  Gen: NAD, conversant, well nourised, well groomed                     Cardiovascular: Regular rate rhythm, no peripheral edema, warm, nontender. Eyes: Conjunctivae clear without exudates or hemorrhage Neck: Supple, no carotid bruits. Pulmonary: Clear to auscultation bilaterally   NEUROLOGICAL EXAM:  MENTAL STATUS: Speech:    Speech is normal; fluent and spontaneous with normal comprehension.  Cognition:     Orientation to time, place and person     Normal recent and remote memory     Normal Attention span and concentration     Normal Language, naming, repeating,spontaneous speech     Fund of knowledge   CRANIAL NERVES: CN II: Visual fields are full to confrontation. Pupils are round equal and briskly reactive to light. CN III, IV, VI: extraocular movement are normal. No ptosis. CN V: Facial sensation is intact to light touch CN VII: Face is symmetric with normal eye closure  CN VIII: Hearing is normal to causal conversation. CN IX, X: Phonation is normal. CN XI: Head turning and shoulder shrug are intact  MOTOR: There is no pronator drift of out-stretched arms. Muscle bulk and tone are normal. Muscle strength is normal.  REFLEXES: Reflexes are 2+ and symmetric at the biceps, triceps, knees, and ankles. Plantar responses are flexor.  SENSORY: Intact to light touch, pinprick and vibratory sensation are intact in fingers and toes.  COORDINATION: There is no trunk or limb dysmetria noted.  GAIT/STANCE: Posture is normal. Gait is steady with normal steps, base, arm swing, and turning. Heel and toe walking are normal. Tandem gait is normal.  Romberg is absent.   DIAGNOSTIC DATA (LABS, IMAGING, TESTING) - I reviewed patient records, labs, notes, testing and imaging myself where available.   ASSESSMENT AND PLAN  Robin Strickland is a 20 y.o. female   Chronic  migraine without aura  Start preventive medication nortriptyline 10 titrating to 20 mg every night as preventive medications  Maxalt 10 mg as needed, may combine with Zofran   Marcial Pacas, M.D. Ph.D.  [facility] [address], [place], [place] [address] Ph: [phone] Fax: [phone]  CC:  Kerin Perna,  [place],  [place] [address]

## 2020-12-09 ENCOUNTER — Ambulatory Visit (INDEPENDENT_AMBULATORY_CARE_PROVIDER_SITE_OTHER): Payer: Medicaid Other | Admitting: Primary Care

## 2021-02-12 ENCOUNTER — Encounter: Payer: Self-pay | Admitting: Family Medicine

## 2021-02-12 ENCOUNTER — Other Ambulatory Visit (HOSPITAL_COMMUNITY)
Admission: RE | Admit: 2021-02-12 | Discharge: 2021-02-12 | Disposition: A | Discharge status: Home / Self Care | Payer: Medicaid Other | Source: Ambulatory Visit | Attending: Family Medicine | Admitting: Family Medicine

## 2021-02-12 ENCOUNTER — Ambulatory Visit (INDEPENDENT_AMBULATORY_CARE_PROVIDER_SITE_OTHER): Payer: Medicaid Other | Admitting: Family Medicine

## 2021-02-12 ENCOUNTER — Other Ambulatory Visit: Payer: Self-pay

## 2021-02-12 VITALS — BP 97/66 | HR 90 | Wt 152.0 lb

## 2021-02-12 DIAGNOSIS — Z124 Encounter for screening for malignant neoplasm of cervix: Secondary | ICD-10-CM

## 2021-02-12 DIAGNOSIS — R82998 Other abnormal findings in urine: Secondary | ICD-10-CM | POA: Diagnosis not present

## 2021-02-12 DIAGNOSIS — R8281 Pyuria: Secondary | ICD-10-CM | POA: Diagnosis not present

## 2021-02-12 DIAGNOSIS — Z113 Encounter for screening for infections with a predominantly sexual mode of transmission: Secondary | ICD-10-CM | POA: Insufficient documentation

## 2021-02-12 DIAGNOSIS — R8290 Unspecified abnormal findings in urine: Secondary | ICD-10-CM | POA: Diagnosis not present

## 2021-02-12 LAB — POCT URINALYSIS DIPSTICK
Bilirubin, UA: NEGATIVE
Blood, UA: NEGATIVE
Glucose, UA: NEGATIVE
Ketones, UA: NEGATIVE
Nitrite, UA: NEGATIVE
Odor: NEGATIVE
Protein, UA: NEGATIVE
Spec Grav, UA: 1.015 (ref 1.010–1.025)
Urobilinogen, UA: 0.2 E.U./dL
pH, UA: 6 (ref 5.0–8.0)

## 2021-02-12 NOTE — Progress Notes (Signed)
   Subjective:    Patient ID: Robin Strickland is a 20 y.o. female presenting with Vaginitis  on 02/12/2021  HPI: Reports malodaorous discharge x 2 wk. No fever, chills, abdominal pain or N/V.  Review of Systems  Constitutional:  Negative for chills and fever.  Respiratory:  Negative for shortness of breath.   Cardiovascular:  Negative for chest pain.  Gastrointestinal:  Negative for abdominal pain, nausea and vomiting.  Genitourinary:  Negative for dysuria.  Skin:  Negative for rash.     Objective:    BP 97/66   Pulse 90   Wt 152 lb (68.9 kg)   BMI 28.72 kg/m  Physical Exam Exam conducted with a chaperone present.  Constitutional:      General: She is not in acute distress.    Appearance: She is well-developed.  HENT:     Head: Normocephalic and atraumatic.  Eyes:     General: No scleral icterus. Cardiovascular:     Rate and Rhythm: Normal rate.  Pulmonary:     Effort: Pulmonary effort is normal.  Abdominal:     Palpations: Abdomen is soft.  Musculoskeletal:     Cervical back: Neck supple.  Skin:    General: Skin is warm and dry.  Neurological:     Mental Status: She is alert and oriented to person, place, and time.        Assessment & Plan:  Malodorous urine - Plan: POCT Urinalysis Dipstick  Screen for STD (sexually transmitted disease) - check and treat as needed - Plan: Hepatitis B surface antigen, Hepatitis C antibody, HIV Antibody (routine testing w rflx), RPR, Cervicovaginal ancillary only  Pyuria - will check urine culture - Plan: Urine Culture  Screening for cervical cancer - needs pap done today - Plan: Cytology - PAP  Return if symptoms worsen or fail to improve.  Donnamae Jude 02/14/2021 1:27 PM

## 2021-02-12 NOTE — Progress Notes (Signed)
Having odorous discharge Urine has odor and having low back pain

## 2021-02-13 LAB — RPR: RPR Ser Ql: NONREACTIVE

## 2021-02-13 LAB — HEPATITIS B SURFACE ANTIGEN: Hepatitis B Surface Ag: NEGATIVE

## 2021-02-13 LAB — HEPATITIS C ANTIBODY: Hep C Virus Ab: <0.1 s/co ratio (ref 0.0–0.9)

## 2021-02-13 LAB — HIV ANTIBODY (ROUTINE TESTING W REFLEX): HIV Screen 4th Generation wRfx: NONREACTIVE

## 2021-02-14 ENCOUNTER — Encounter: Payer: Self-pay | Admitting: Family Medicine

## 2021-02-16 ENCOUNTER — Other Ambulatory Visit: Payer: Self-pay | Admitting: *Deleted

## 2021-02-16 ENCOUNTER — Telehealth: Payer: Self-pay

## 2021-02-16 LAB — CERVICOVAGINAL ANCILLARY ONLY
Bacterial Vaginitis (gardnerella): POSITIVE — AB
Candida Glabrata: NEGATIVE
Candida Vaginitis: NEGATIVE
Chlamydia: NEGATIVE
Comment: NEGATIVE
Comment: NEGATIVE
Comment: NEGATIVE
Comment: NEGATIVE
Comment: NEGATIVE
Comment: NORMAL
Neisseria Gonorrhea: NEGATIVE
Trichomonas: NEGATIVE

## 2021-02-16 LAB — URINE CULTURE

## 2021-02-16 MED ORDER — METRONIDAZOLE 500 MG PO TABS: 500.0000 mg | ORAL_TABLET | Freq: Two times a day (BID) | ORAL | 0 refills | Status: DC

## 2021-02-17 MED ORDER — METRONIDAZOLE 500 MG PO TABS: 500.0000 mg | ORAL_TABLET | Freq: Two times a day (BID) | ORAL | 0 refills | Status: DC

## 2021-02-17 MED ORDER — SULFAMETHOXAZOLE-TRIMETHOPRIM 800-160 MG PO TABS: 1.0000 | ORAL_TABLET | Freq: Two times a day (BID) | ORAL | 0 refills | Status: DC

## 2021-02-17 NOTE — Addendum Note (Signed)
Addended by: Donnamae Jude on: 02/17/2021 08:05 AM   Modules accepted: Orders

## 2021-02-20 LAB — CYTOLOGY - PAP: Diagnosis: NEGATIVE

## 2021-02-24 ENCOUNTER — Encounter: Payer: Self-pay | Admitting: Radiology

## 2021-06-16 ENCOUNTER — Ambulatory Visit (INDEPENDENT_AMBULATORY_CARE_PROVIDER_SITE_OTHER): Payer: Medicaid Other | Admitting: Primary Care

## 2021-06-24 ENCOUNTER — Ambulatory Visit (INDEPENDENT_AMBULATORY_CARE_PROVIDER_SITE_OTHER): Payer: Medicaid Other | Admitting: Primary Care

## 2021-07-31 ENCOUNTER — Other Ambulatory Visit: Payer: Self-pay | Admitting: Neurology

## 2021-08-11 ENCOUNTER — Ambulatory Visit: Payer: Medicaid Other

## 2021-08-12 ENCOUNTER — Ambulatory Visit: Payer: Medicaid Other

## 2021-08-12 ENCOUNTER — Ambulatory Visit (HOSPITAL_COMMUNITY)
Admission: EM | Admit: 2021-08-12 | Discharge: 2021-08-12 | Disposition: A | Discharge status: Home / Self Care | Payer: Medicaid Other | Attending: Family Medicine | Admitting: Family Medicine

## 2021-08-12 ENCOUNTER — Other Ambulatory Visit: Payer: Self-pay

## 2021-08-12 DIAGNOSIS — R300 Dysuria: Secondary | ICD-10-CM

## 2021-08-12 DIAGNOSIS — N898 Other specified noninflammatory disorders of vagina: Secondary | ICD-10-CM | POA: Diagnosis not present

## 2021-08-12 LAB — POCT URINALYSIS DIPSTICK, ED / UC
Bilirubin Urine: NEGATIVE
Glucose, UA: NEGATIVE mg/dL
Nitrite: NEGATIVE
Protein, ur: 30 mg/dL — AB
Specific Gravity, Urine: >=1.03 (ref 1.005–1.030)
Urobilinogen, UA: 1 mg/dL (ref 0.0–1.0)
pH: 5.5 (ref 5.0–8.0)

## 2021-08-12 LAB — POC URINE PREG, ED: Preg Test, Ur: NEGATIVE

## 2021-08-12 MED ORDER — FLUCONAZOLE 150 MG PO TABS: 150.0000 mg | ORAL_TABLET | ORAL | 0 refills | Status: AC

## 2021-08-12 MED ORDER — NITROFURANTOIN MONOHYD MACRO 100 MG PO CAPS: 100.0000 mg | ORAL_CAPSULE | Freq: Two times a day (BID) | ORAL | 0 refills | Status: AC

## 2021-08-12 NOTE — ED Triage Notes (Signed)
Pt reports vaginal discharge with some spotting x 3 -4 days. She reports her discharge is white. ?

## 2021-08-12 NOTE — Discharge Instructions (Addendum)
Your urinalysis showed white blood cells and red blood cells. ? ?Test was negative. ? ?Staff will call you with any positives on the swab, so that they can send in treatment.  Urine culture is also sent, and if the antibiotics need to be changed for possible urine infection, they will also call you about that. ? ?Take nitrofurantoin 100 mg, 1 tab twice daily for 7 days. ? ?Take fluconazole 150 mg 1 tab every 3 days for 2 doses. ?

## 2021-08-12 NOTE — ED Provider Notes (Signed)
?[facility] ? ? ? ?CSN: [id number] ?Arrival date & time: 08/12/21  1607 ? ? ?  ? ?History   ?Chief Complaint ?Chief Complaint  ?Patient presents with  ? Vaginal Discharge  ? Vaginal Bleeding  ? ? ?HPI ?Robin Strickland is a 21 y.o. female.  ? ? ?Vaginal Discharge ?Vaginal Bleeding ?Associated symptoms: vaginal discharge   ?Here for a 3 to 4-day history of vaginal and perineal irritation.  She has noted thick white cottage cheese type discharge, and she is itching a lot.  Also though she has noted some dysuria and some urinary frequency.  In fact the burning was bad enough today that she had a hard time emptying her bladder. ? ?No fever or chills or vomiting ?Last menstrual period was February 12 ? ?Past Medical History:  ?Diagnosis Date  ? Gestational diabetes   ? During 1st pregnancy.  ? Headache   ? History of gestational diabetes 04/19/2016  ? 2021: early a1c negative.   ? UTI (urinary tract infection) during pregnancy 07/02/2019  ? '[ ]'$  needs toc: neg  ? ? ?Patient Active Problem List  ? Diagnosis Date Noted  ? 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 Supervision of other normal pregnancy, antepartum 06/06/2019  ? Migraine without aura and without status migrainosus, not intractable 10/28/2017  ? ? ?Past Surgical History:  ?Procedure Laterality Date  ? NO PAST SURGERIES    ? ? ?OB History   ? ? Gravida  ?2  ? Para  ?2  ? Term  ?2  ? Preterm  ?   ? AB  ?   ? Living  ?2  ?  ? ? SAB  ?   ? IAB  ?   ? Ectopic  ?   ? Multiple  ?0  ? Live Births  ?2  ?   ?  ?  ? ? ? ?Home Medications   ? ?Prior to Admission medications   ?Medication Sig Start Date End Date Taking? Authorizing Provider  ?fluconazole (DIFLUCAN) 150 MG tablet Take 1 tablet (150 mg total) by mouth every 3 (three) days for 2 doses. 08/12/21 08/16/21 Yes Cortlandt Capuano, Gwenlyn Perking, MD  ?nitrofurantoin, macrocrystal-monohydrate, (MACROBID) 100 MG capsule Take 1 capsule (100 mg total) by mouth 2 (two) times  daily for 7 days. 08/12/21 08/19/21 Yes Abhimanyu Cruces, Gwenlyn Perking, MD  ?ibuprofen (ADVIL) 600 MG tablet TAKE 1 TABLET (600 MG TOTAL) BY MOUTH EVERY 8 (EIGHT) HOURS AS NEEDED FOR HEADACHE. 06/30/20   Kerin Perna, NP  ?nortriptyline (PAMELOR) 10 MG capsule Take 2 capsules (20 mg total) by mouth at bedtime. ?Patient not taking: Reported on 02/12/2021 07/31/20   Marcial Pacas, MD  ? ? ?Family History ?Family History  ?Problem Relation Age of Onset  ? Hypertension Mother   ? Kidney disease Mother   ? Other Father   ?     unsure of medical history  ? Hypertension Maternal Grandmother   ? Kidney disease Maternal Grandmother   ? ? ?Social History ?Social History  ? ?Tobacco Use  ? Smoking status: Never  ? Smokeless tobacco: Never  ?Vaping Use  ? Vaping Use: Never used  ?Substance Use Topics  ? Alcohol use: No  ? Drug use: No  ? ? ? ?Allergies   ?Patient has no known allergies. ? ? ?Review of Systems ?Review of Systems  ?Genitourinary:  Positive for vaginal bleeding and vaginal discharge.  ? ? ?Physical Exam ?Triage Vital Signs ?  ED Triage Vitals [08/12/21 1723]  ?Enc Vitals Group  ?   BP 103/63  ?   Pulse Rate 87  ?   Resp 16  ?   Temp 98.3 ?F (36.8 ?C)  ?   Temp Source Oral  ?   SpO2 99 %  ?   Weight   ?   Height   ?   Head Circumference   ?   Peak Flow   ?   Pain Score   ?   Pain Loc   ?   Pain Edu?   ?   Excl. in [place]?   ? ?No data found. ? ?Updated Vital Signs ?BP 103/63 (BP Location: Left Arm)   Pulse 87   Temp 98.3 ?F (36.8 ?C) (Oral)   Resp 16   SpO2 99%  ? ?Visual Acuity ?Right Eye Distance:   ?Left Eye Distance:   ?Bilateral Distance:   ? ?Right Eye Near:   ?Left Eye Near:    ?Bilateral Near:    ? ?Physical Exam ?Vitals reviewed.  ?Constitutional:   ?   General: She is not in acute distress. ?   Appearance: She is not toxic-appearing.  ?HENT:  ?   Nose: Nose normal.  ?   Mouth/Throat:  ?   Mouth: Mucous membranes are moist.  ?   Pharynx: No oropharyngeal exudate or posterior oropharyngeal erythema.  ?Eyes:  ?   Extraocular  Movements: Extraocular movements intact.  ?   Pupils: Pupils are equal, round, and reactive to light.  ?Cardiovascular:  ?   Rate and Rhythm: Normal rate and regular rhythm.  ?   Heart sounds: No murmur heard. ?Pulmonary:  ?   Effort: Pulmonary effort is normal.  ?   Breath sounds: Normal breath sounds.  ?Abdominal:  ?   Palpations: Abdomen is soft.  ?   Tenderness: There is no abdominal tenderness.  ?Musculoskeletal:  ?   Cervical back: Neck supple.  ?Lymphadenopathy:  ?   Cervical: No cervical adenopathy.  ?Neurological:  ?   Mental Status: She is alert.  ? ? ? ?UC Treatments / Results  ?Labs ?(all labs ordered are listed, but only abnormal results are displayed) ?Labs Reviewed  ?POCT URINALYSIS DIPSTICK, ED / UC - Abnormal; Notable for the following components:  ?    Result Value  ? Ketones, ur TRACE (*)   ? Hgb urine dipstick TRACE (*)   ? Protein, ur 30 (*)   ? Leukocytes,Ua LARGE (*)   ? All other components within normal limits  ?URINE CULTURE  ?POC URINE PREG, ED  ?CERVICOVAGINAL ANCILLARY ONLY  ? ? ?EKG ? ? ?Radiology ?No results found. ? ?Procedures ?Procedures (including critical care time) ? ?Medications Ordered in UC ?Medications - No data to display ? ?Initial Impression / Assessment and Plan / UC Course  ?I have reviewed the triage vital signs and the nursing notes. ? ?Pertinent labs & imaging results that were available during my care of the patient were reviewed by me and considered in my medical decision making (see chart for details). ? ?  ? ?UPT is negative ?Urinalysis showed trace of blood and large amount of white blood cells.  Also trace of ketones and a little bit of protein.  Urine culture is sent.  I am going to go and treat empirically for UTI with her having the dysuria.  Also will treat empirically for yeast infections that she described it so well ?Final Clinical Impressions(s) / UC Diagnoses  ? ?  Final diagnoses:  ?Dysuria  ?Vaginal discharge  ? ? ? ?Discharge Instructions   ? ?  ?Your  urinalysis showed white blood cells and red blood cells. ? ?Test was negative. ? ?Staff will call you with any positives on the swab, so that they can send in treatment.  Urine culture is also sent, and if the antibiotics need to be changed for possible urine infection, they will also call you about that. ? ?Take nitrofurantoin 100 mg, 1 tab twice daily for 7 days. ? ?Take fluconazole 150 mg 1 tab every 3 days for 2 doses. ? ? ? ? ?ED Prescriptions   ? ? Medication Sig Dispense Auth. Provider  ? nitrofurantoin, macrocrystal-monohydrate, (MACROBID) 100 MG capsule Take 1 capsule (100 mg total) by mouth 2 (two) times daily for 7 days. 14 capsule Barrett Henle, MD  ? fluconazole (DIFLUCAN) 150 MG tablet Take 1 tablet (150 mg total) by mouth every 3 (three) days for 2 doses. 2 tablet Barrett Henle, MD  ? ?  ? ?PDMP not reviewed this encounter. ?  ?Barrett Henle, MD ?08/12/21 1840 ? ?

## 2021-08-13 LAB — CERVICOVAGINAL ANCILLARY ONLY
Bacterial Vaginitis (gardnerella): NEGATIVE
Candida Glabrata: NEGATIVE
Candida Vaginitis: POSITIVE — AB
Chlamydia: NEGATIVE
Comment: NEGATIVE
Comment: NEGATIVE
Comment: NEGATIVE
Comment: NEGATIVE
Comment: NEGATIVE
Comment: NORMAL
Neisseria Gonorrhea: NEGATIVE
Trichomonas: NEGATIVE

## 2021-08-14 LAB — URINE CULTURE: Culture: 80000 — AB

## 2021-08-19 ENCOUNTER — Other Ambulatory Visit: Payer: Self-pay | Admitting: Family Medicine

## 2021-09-01 ENCOUNTER — Other Ambulatory Visit: Payer: Self-pay | Admitting: Neurology

## 2021-09-28 ENCOUNTER — Other Ambulatory Visit: Payer: Self-pay | Admitting: Neurology

## 2021-10-21 ENCOUNTER — Other Ambulatory Visit: Payer: Self-pay | Admitting: Neurology

## 2021-11-17 ENCOUNTER — Ambulatory Visit: Payer: Medicaid Other | Admitting: Neurology

## 2021-11-17 ENCOUNTER — Encounter: Payer: Self-pay | Admitting: Neurology

## 2021-11-17 VITALS — BP 103/68 | HR 73 | Ht 61.0 in | Wt 149.0 lb

## 2021-11-17 DIAGNOSIS — G43709 Chronic migraine without aura, not intractable, without status migrainosus: Secondary | ICD-10-CM | POA: Diagnosis not present

## 2021-11-17 MED ORDER — ONDANSETRON 4 MG PO TBDP: 4.0000 mg | ORAL_TABLET | Freq: Three times a day (TID) | ORAL | 6 refills | Status: DC | PRN

## 2021-11-17 MED ORDER — RIZATRIPTAN BENZOATE 10 MG PO TBDP: 10.0000 mg | ORAL_TABLET | Freq: Two times a day (BID) | ORAL | 11 refills | Status: DC | PRN

## 2021-11-17 NOTE — Progress Notes (Signed)
Chief Complaint  Patient presents with   Follow-up    Room 19, alone,  States she is doing well. Reports 1-2 migraines a month, no longer taking PAMELOR, states makes her too drowsy, Maxalt works well for her      HISTORICAL  Robin Strickland is a 21 year old female, seen in request by her primary care nurse practitioner Juluis Mire for evaluation of chronic migraine headache initial evaluation was on July 31, 2020  I reviewed and summarized the referring note.  Reported long history of migraine headaches, especially since her first born in 2018, she was put on Nexplanon implant, reported significant increase of her migraine headaches,  She described her typical migraine headache as lateralized severe pounding headache with associated light noise smell sensitivity, nauseous, sometimes frequent vomiting, lasting hours to days,  Trigger for migraine are stress, sleep deprivation, dehydration, missing her meals, weather changes, and hormonal changes,  Because of frequent headaches, she changed her contraceptives from every 3 months implant Nexplanon to oral contraceptives, continue have frequent migraine headaches,  Her migraine has improved during her pregnancy, then began to have frequent headaches again following her delivery in August 2021, she now uses IUD,  She is having migraine 1-2 times each week, taking up to ibuprofen 600 mg 2 tablets with limited help, previously tried Excedrin Migraine without significant improvement  UPDATE November 17 2021: She could not tolerate nortriptyline, make her drowsy, overall headache has much improved, she lost about 20 pounds over the past 1 year, stopped drinking soda, having migraine headaches 1-2 times each months, often associated with nausea, light noise sensitivity, movement make it worse, lateralized severe pounding headache, Maxalt works well, especially if she takes it early  REVIEW OF SYSTEMS: Full 14 system review  of systems performed and notable only for as above All other review of systems were negative.  ALLERGIES: No Known Allergies  HOME MEDICATIONS: Current Outpatient Medications  Medication Sig Dispense Refill   ibuprofen (ADVIL) 600 MG tablet TAKE 1 TABLET (600 MG TOTAL) BY MOUTH EVERY 8 (EIGHT) HOURS AS NEEDED FOR HEADACHE. 90 tablet 1   rizatriptan (MAXALT-MLT) 10 MG disintegrating tablet Take 10 mg by mouth 2 (two) times daily as needed.     No current facility-administered medications for this visit.    PAST MEDICAL HISTORY: Past Medical History:  Diagnosis Date   Gestational diabetes    During 1st pregnancy.   Headache    History of gestational diabetes 04/19/2016   2021: early a1c negative.    UTI (urinary tract infection) during pregnancy 07/02/2019   '[ ]'$  needs toc: neg    PAST SURGICAL HISTORY: Past Surgical History:  Procedure Laterality Date   NO PAST SURGERIES      FAMILY HISTORY: Family History  Problem Relation Age of Onset   Hypertension Mother    Kidney disease Mother    Other Father        unsure of medical history   Hypertension Maternal Grandmother    Kidney disease Maternal Grandmother     SOCIAL HISTORY: Social History   Socioeconomic History   Marital status: Married    Spouse name: Not on file   Number of children: 2   Years of education: 12   Highest education level: High school graduate  Occupational History   Occupation: Occupational psychologist  Tobacco Use   Smoking status: Never   Smokeless tobacco: Never  Vaping Use   Vaping Use: Never used  Substance and Sexual Activity  Alcohol use: No   Drug use: No   Sexual activity: Yes    Birth control/protection: I.U.D.  Other Topics Concern   Not on file  Social History Narrative   Lives at home with family.   Right-handed.   No daily use of caffeine, occasional iced coffee or soda.   Social Determinants of Health   Financial Resource Strain: Not on file  Food Insecurity: Not on file   Transportation Needs: Not on file  Physical Activity: Not on file  Stress: Not on file  Social Connections: Not on file  Intimate Partner Violence: Not on file     PHYSICAL EXAM   Vitals:   11/17/21 1041  BP: 103/68  Pulse: 73  Weight: 149 lb (67.6 kg)  Height: '5\' 1"'$  (1.549 m)   Not recorded     Body mass index is 28.15 kg/m.  PHYSICAL EXAMNIATION:  Gen: NAD, conversant, well nourised, well groomed                     Cardiovascular: Regular rate rhythm, no peripheral edema, warm, nontender. Eyes: Conjunctivae clear without exudates or hemorrhage Neck: Supple, no carotid bruits. Pulmonary: Clear to auscultation bilaterally   NEUROLOGICAL EXAM:  MENTAL STATUS: Speech/cognition: Awake, alert, oriented to history taking and casual conversation   CRANIAL NERVES: CN II: Visual fields are full to confrontation. Pupils are round equal and briskly reactive to light. CN III, IV, VI: extraocular movement are normal. No ptosis. CN V: Facial sensation is intact to light touch CN VII: Face is symmetric with normal eye closure  CN VIII: Hearing is normal to causal conversation. CN IX, X: Phonation is normal. CN XI: Head turning and shoulder shrug are intact  MOTOR: There is no pronator drift of out-stretched arms. Muscle bulk and tone are normal. Muscle strength is normal.  REFLEXES: Reflexes are 1 and symmetric at the biceps, triceps, knees, and ankles. Plantar responses are flexor.  SENSORY: Intact to light touch, pinprick   COORDINATION: There is no trunk or limb dysmetria noted.  GAIT/STANCE: Posture is normal. Gait is steady  DIAGNOSTIC DATA (LABS, IMAGING, TESTING) - I reviewed patient records, labs, notes, testing and imaging myself where available.   ASSESSMENT AND PLAN  Khrista Lehua Flores is a 21 y.o. female   Chronic migraine without aura  Could not tolerate nortriptyline  Overall doing well,  Maxalt 10 mg works well, as needed,  advised her to take it already on, may combine with Zofran with Aleve for prolonged severe headaches  Only return to clinic for new issues   Marcial Pacas, M.D. Ph.D.  [facility] [address] [place], [place] [address] Ph: [phone] Fax: [phone]  CC:  Kerin Perna,  [place],  [place] [id number]

## 2021-12-21 ENCOUNTER — Encounter (HOSPITAL_COMMUNITY): Payer: Self-pay | Admitting: Emergency Medicine

## 2021-12-21 ENCOUNTER — Emergency Department (HOSPITAL_COMMUNITY)
Admission: EM | Admit: 2021-12-21 | Discharge: 2021-12-21 | Disposition: A | Discharge status: Home / Self Care | Payer: Medicaid Other | Attending: Emergency Medicine | Admitting: Emergency Medicine

## 2021-12-21 ENCOUNTER — Other Ambulatory Visit: Payer: Self-pay

## 2021-12-21 DIAGNOSIS — U071 COVID-19: Secondary | ICD-10-CM | POA: Insufficient documentation

## 2021-12-21 DIAGNOSIS — R519 Headache, unspecified: Secondary | ICD-10-CM | POA: Diagnosis present

## 2021-12-21 LAB — URINALYSIS, ROUTINE W REFLEX MICROSCOPIC
Bilirubin Urine: NEGATIVE
Glucose, UA: NEGATIVE mg/dL
Hgb urine dipstick: NEGATIVE
Ketones, ur: 5 mg/dL — AB
Leukocytes,Ua: NEGATIVE
Nitrite: NEGATIVE
Protein, ur: 30 mg/dL — AB
Specific Gravity, Urine: 1.029 (ref 1.005–1.030)
pH: 6 (ref 5.0–8.0)

## 2021-12-21 LAB — CBC WITH DIFFERENTIAL/PLATELET
Abs Immature Granulocytes: 0.02 10*3/uL (ref 0.00–0.07)
Basophils Absolute: 0 10*3/uL (ref 0.0–0.1)
Basophils Relative: 0 %
Eosinophils Absolute: 0 10*3/uL (ref 0.0–0.5)
Eosinophils Relative: 1 %
HCT: 38.4 % (ref 36.0–46.0)
Hemoglobin: 12.5 g/dL (ref 12.0–15.0)
Immature Granulocytes: 0 %
Lymphocytes Relative: 10 %
Lymphs Abs: 0.5 10*3/uL — ABNORMAL LOW (ref 0.7–4.0)
MCH: 30 pg (ref 26.0–34.0)
MCHC: 32.6 g/dL (ref 30.0–36.0)
MCV: 92.3 fL (ref 80.0–100.0)
Monocytes Absolute: 0.8 10*3/uL (ref 0.1–1.0)
Monocytes Relative: 15 %
Neutro Abs: 3.9 10*3/uL (ref 1.7–7.7)
Neutrophils Relative %: 74 %
Platelets: 286 10*3/uL (ref 150–400)
RBC: 4.16 MIL/uL (ref 3.87–5.11)
RDW: 14.1 % (ref 11.5–15.5)
WBC: 5.3 10*3/uL (ref 4.0–10.5)
nRBC: 0 % (ref 0.0–0.2)

## 2021-12-21 LAB — COMPREHENSIVE METABOLIC PANEL
ALT: 13 U/L (ref 0–44)
AST: 17 U/L (ref 15–41)
Albumin: 3.9 g/dL (ref 3.5–5.0)
Alkaline Phosphatase: 63 U/L (ref 38–126)
Anion gap: 10 (ref 5–15)
BUN: 9 mg/dL (ref 6–20)
CO2: 23 mmol/L (ref 22–32)
Calcium: 8.9 mg/dL (ref 8.9–10.3)
Chloride: 104 mmol/L (ref 98–111)
Creatinine, Ser: 0.73 mg/dL (ref 0.44–1.00)
GFR, Estimated: >60 mL/min (ref >60)
Glucose, Bld: 99 mg/dL (ref 70–99)
Potassium: 3.5 mmol/L (ref 3.5–5.1)
Sodium: 137 mmol/L (ref 135–145)
Total Bilirubin: 1.4 mg/dL — ABNORMAL HIGH (ref 0.3–1.2)
Total Protein: 7.4 g/dL (ref 6.5–8.1)

## 2021-12-21 LAB — I-STAT BETA HCG BLOOD, ED (MC, WL, AP ONLY): I-stat hCG, quantitative: <5 m[IU]/mL (ref <5)

## 2021-12-21 LAB — RESP PANEL BY RT-PCR (FLU A&B, COVID) ARPGX2
Influenza A by PCR: NEGATIVE
Influenza B by PCR: NEGATIVE
SARS Coronavirus 2 by RT PCR: POSITIVE — AB

## 2021-12-21 MED ORDER — IBUPROFEN 800 MG PO TABS
800.0000 mg | ORAL_TABLET | Freq: Once | ORAL | Status: AC
  Administered 2021-12-21: 800 mg via ORAL
  Filled 2021-12-21: qty 1

## 2021-12-21 NOTE — ED Provider Notes (Signed)
[facility] EMERGENCY DEPARTMENT Provider Note   CSN: [id number] Arrival date & time: 12/21/21  1121     History  Chief Complaint  Patient presents with   Headache        Fever   Sore Throat    Robin Strickland is a 21 y.o. female.  21 year old female presents with complaint of fever and headache for the past 2 to 3 days.  Also notes sore throat and body aches.  Tmax at home 105.  Took Advil prior to arrival.  Also had 1 episode of vomiting and a dry cough.  No known sick contacts no tick bites, no recent travel.       Home Medications Prior to Admission medications   Medication Sig Start Date End Date Taking? Authorizing Provider  ibuprofen (ADVIL) 600 MG tablet TAKE 1 TABLET (600 MG TOTAL) BY MOUTH EVERY 8 (EIGHT) HOURS AS NEEDED FOR HEADACHE. 06/30/20   Kerin Perna, NP  ondansetron (ZOFRAN-ODT) 4 MG disintegrating tablet Take 1 tablet (4 mg total) by mouth every 8 (eight) hours as needed for nausea or vomiting. 11/17/21   Marcial Pacas, MD  rizatriptan (MAXALT-MLT) 10 MG disintegrating tablet Take 1 tablet (10 mg total) by mouth 2 (two) times daily as needed. 11/17/21   Marcial Pacas, MD      Allergies    Patient has no known allergies.    Review of Systems   Review of Systems Negative except as per HPI Physical Exam Updated Vital Signs BP 105/69 (BP Location: Right Arm)   Pulse (!) 124   Temp (!) 102.9 F (39.4 C) (Oral)   Resp 12   SpO2 99%  Physical Exam Vitals and nursing note reviewed.  Constitutional:      General: Robin Strickland is not in acute distress.    Appearance: Robin Strickland is well-developed. Robin Strickland is not diaphoretic.  HENT:     Head: Normocephalic and atraumatic.     Nose: No congestion.     Mouth/Throat:     Mouth: Mucous membranes are moist.     Pharynx: Uvula midline. No pharyngeal swelling, oropharyngeal exudate or uvula swelling.     Tonsils: No tonsillar exudate or tonsillar abscesses. 1+ on the right. 1+ on the left.   Eyes:     General: No scleral icterus.    Conjunctiva/sclera: Conjunctivae normal.  Cardiovascular:     Rate and Rhythm: Regular rhythm. Tachycardia present.     Heart sounds: Normal heart sounds.  Pulmonary:     Effort: Pulmonary effort is normal.     Breath sounds: Normal breath sounds.  Abdominal:     Palpations: Abdomen is soft.  Musculoskeletal:        General: No tenderness.     Cervical back: Neck supple. No rigidity.  Lymphadenopathy:     Cervical: No cervical adenopathy.  Skin:    General: Skin is warm and dry.  Neurological:     Mental Status: Robin Strickland to person, place, and time.  Psychiatric:        Behavior: Behavior normal.     ED Results / Procedures / Treatments   Labs (all labs ordered are listed, but only abnormal results are displayed) Labs Reviewed  RESP PANEL BY RT-PCR (FLU A&B, COVID) ARPGX2 - Abnormal; Notable for the following components:      Result Value   SARS Coronavirus 2 by RT PCR POSITIVE (*)    All other components within normal limits  COMPREHENSIVE METABOLIC  PANEL - Abnormal; Notable for the following components:   Total Bilirubin 1.4 (*)    All other components within normal limits  CBC WITH DIFFERENTIAL/PLATELET - Abnormal; Notable for the following components:   Lymphs Abs 0.5 (*)    All other components within normal limits  URINALYSIS, ROUTINE W REFLEX MICROSCOPIC - Abnormal; Notable for the following components:   Color, Urine AMBER (*)    APPearance HAZY (*)    Ketones, ur 5 (*)    Protein, ur 30 (*)    Bacteria, UA FEW (*)    Non Squamous Epithelial 0-5 (*)    All other components within normal limits  I-STAT BETA HCG BLOOD, ED ([facility], [facility], [facility] ONLY)    EKG None  Radiology No results found.  Procedures Procedures    Medications Ordered in ED Medications  ibuprofen (ADVIL) tablet 800 mg (800 mg Oral Given 12/21/21 1214)    ED Course/ Medical Decision Making/ A&P                           Medical  Decision Making  21 year old otherwise healthy female with fever, cough, headache and body aches x 2 days. Found to be COVID+. Patient is well appearing, no nuchal rigidity. Discussed fever management, quarantine, return as needed.         Final Clinical Impression(s) / ED Diagnoses Final diagnoses:  COVID    Rx / DC Orders ED Discharge Orders     None         Tacy Learn, PA-C 12/21/21 1437    Sherwood Gambler, MD 12/22/21 [phone]

## 2021-12-21 NOTE — ED Triage Notes (Signed)
Pt states she started having bodyaches, fever, sore throat, vomiting on Saturday.

## 2021-12-21 NOTE — ED Notes (Signed)
Patient c/o not feeling good since Sat. C/o fever with chills and headache.

## 2021-12-21 NOTE — Discharge Instructions (Signed)
Motrin and Tylenol as needed as directed for fever. You can take Tylenol every 4-6 hours and Motrin every 6-8 hours. It is ok if you take both medications at the same time. Home to quarantine. Return for worsening or concerning symptoms.

## 2021-12-21 NOTE — ED Provider Triage Note (Signed)
Emergency Medicine Provider Triage Evaluation Note  Robin Strickland , a 21 y.o. female  was evaluated in triage.  Pt complains of sore throat. Onset 2 days ago. Plus  migraine HA not improved with rizatriptan, fever with TMAX of 105 F at home. Took advil with defervescence. . + dry cough, and one episode of vomiting. No hx of strep throat No recent travel. NO known tick bites  Review of Systems  Positive: Sore throat Negative: sob  Physical Exam  BP 105/69 (BP Location: Right Arm)   Pulse (!) 124   Temp (!) 102.9 F (39.4 C) (Oral)   Resp 12   SpO2 99%  Gen:   Awake, no distress   Resp:  Normal effort  MSK:   Moves extremities without difficulty  Other:  NO exudates, + lymphadenopathy  Medical Decision Making  Medically screening exam initiated at 11:34 AM.  Appropriate orders placed.  [facility] was informed that the remainder of the evaluation will be completed by another provider, this initial triage assessment does not replace that evaluation, and the importance of remaining in the ED until their evaluation is complete.  Work up initiated.    Margarita Mail, PA-C 12/21/21 1142

## 2022-05-09 ENCOUNTER — Encounter (HOSPITAL_COMMUNITY): Payer: Self-pay

## 2022-05-09 ENCOUNTER — Emergency Department (HOSPITAL_COMMUNITY)
Admission: EM | Admit: 2022-05-09 | Discharge: 2022-05-10 | Discharge status: Against Medical Advice | Payer: Medicaid Other | Attending: Emergency Medicine | Admitting: Emergency Medicine

## 2022-05-09 DIAGNOSIS — N764 Abscess of vulva: Secondary | ICD-10-CM | POA: Diagnosis not present

## 2022-05-09 DIAGNOSIS — Z5321 Procedure and treatment not carried out due to patient leaving prior to being seen by health care provider: Secondary | ICD-10-CM | POA: Diagnosis not present

## 2022-05-09 NOTE — ED Provider Triage Note (Signed)
Emergency Medicine Provider Triage Evaluation Note  Robin Strickland , a 21 y.o. female  was evaluated in triage.  Pt complains of vaginal abscess.   She has a history of a boil on her face, none in the pelvic area.  She has had symptoms for several days.  No drainage from the area.  No vomiting or fever.  Review of Systems  Positive: Vaginal swelling Negative: Fever  Physical Exam  There were no vitals taken for this visit. Gen:   Awake, no distress   Resp:  Normal effort  MSK:   Moves extremities without difficulty  Other:    Medical Decision Making  Medically screening exam initiated at 7:56 PM.  Appropriate orders placed.  [place] was informed that the remainder of the evaluation will be completed by another provider, this initial triage assessment does not replace that evaluation, and the importance of remaining in the ED until their evaluation is complete.     Carlisle Cater, PA-C 05/09/22 [phone]

## 2022-05-09 NOTE — ED Triage Notes (Signed)
Pt reports that she has an abscess on her vaginal area with no drainage or fevers

## 2022-05-10 NOTE — ED Notes (Signed)
Registration unable to locate pt on numerous attempts

## 2022-05-12 ENCOUNTER — Encounter (HOSPITAL_COMMUNITY): Payer: Self-pay

## 2022-05-12 ENCOUNTER — Ambulatory Visit (HOSPITAL_COMMUNITY)
Admission: EM | Admit: 2022-05-12 | Discharge: 2022-05-12 | Disposition: A | Discharge status: Home / Self Care | Payer: Medicaid Other | Attending: Physician Assistant | Admitting: Physician Assistant

## 2022-05-12 DIAGNOSIS — R1084 Generalized abdominal pain: Secondary | ICD-10-CM | POA: Diagnosis not present

## 2022-05-12 DIAGNOSIS — R109 Unspecified abdominal pain: Secondary | ICD-10-CM | POA: Diagnosis not present

## 2022-05-12 DIAGNOSIS — R1110 Vomiting, unspecified: Secondary | ICD-10-CM | POA: Diagnosis not present

## 2022-05-12 DIAGNOSIS — R10826 Epigastric rebound abdominal tenderness: Secondary | ICD-10-CM | POA: Diagnosis not present

## 2022-05-12 DIAGNOSIS — D72829 Elevated white blood cell count, unspecified: Secondary | ICD-10-CM | POA: Diagnosis not present

## 2022-05-12 DIAGNOSIS — N3001 Acute cystitis with hematuria: Secondary | ICD-10-CM | POA: Diagnosis not present

## 2022-05-12 DIAGNOSIS — R110 Nausea: Secondary | ICD-10-CM | POA: Insufficient documentation

## 2022-05-12 DIAGNOSIS — R1030 Lower abdominal pain, unspecified: Secondary | ICD-10-CM | POA: Diagnosis not present

## 2022-05-12 LAB — POCT URINALYSIS DIPSTICK, ED / UC
Bilirubin Urine: NEGATIVE
Glucose, UA: NEGATIVE mg/dL
Ketones, ur: NEGATIVE mg/dL
Nitrite: NEGATIVE
Protein, ur: NEGATIVE mg/dL
Specific Gravity, Urine: 1.025 (ref 1.005–1.030)
Urobilinogen, UA: 0.2 mg/dL (ref 0.0–1.0)
pH: 6 (ref 5.0–8.0)

## 2022-05-12 LAB — CBC WITH DIFFERENTIAL/PLATELET
Abs Immature Granulocytes: 0.06 10*3/uL (ref 0.00–0.07)
Basophils Absolute: 0.1 10*3/uL (ref 0.0–0.1)
Basophils Relative: 0 %
Eosinophils Absolute: 0.2 10*3/uL (ref 0.0–0.5)
Eosinophils Relative: 1 %
HCT: 38.9 % (ref 36.0–46.0)
Hemoglobin: 12.5 g/dL (ref 12.0–15.0)
Immature Granulocytes: 1 %
Lymphocytes Relative: 18 %
Lymphs Abs: 2.2 10*3/uL (ref 0.7–4.0)
MCH: 29.3 pg (ref 26.0–34.0)
MCHC: 32.1 g/dL (ref 30.0–36.0)
MCV: 91.3 fL (ref 80.0–100.0)
Monocytes Absolute: 0.5 10*3/uL (ref 0.1–1.0)
Monocytes Relative: 4 %
Neutro Abs: 9.5 10*3/uL — ABNORMAL HIGH (ref 1.7–7.7)
Neutrophils Relative %: 76 %
Platelets: 409 10*3/uL — ABNORMAL HIGH (ref 150–400)
RBC: 4.26 MIL/uL (ref 3.87–5.11)
RDW: 13.5 % (ref 11.5–15.5)
WBC: 12.4 10*3/uL — ABNORMAL HIGH (ref 4.0–10.5)
nRBC: 0 % (ref 0.0–0.2)

## 2022-05-12 LAB — COMPREHENSIVE METABOLIC PANEL
ALT: 24 U/L (ref 0–44)
AST: 18 U/L (ref 15–41)
Albumin: 3.7 g/dL (ref 3.5–5.0)
Alkaline Phosphatase: 75 U/L (ref 38–126)
Anion gap: 6 (ref 5–15)
BUN: 12 mg/dL (ref 6–20)
CO2: 26 mmol/L (ref 22–32)
Calcium: 8.9 mg/dL (ref 8.9–10.3)
Chloride: 105 mmol/L (ref 98–111)
Creatinine, Ser: 0.68 mg/dL (ref 0.44–1.00)
GFR, Estimated: >60 mL/min (ref >60)
Glucose, Bld: 109 mg/dL — ABNORMAL HIGH (ref 70–99)
Potassium: 4.4 mmol/L (ref 3.5–5.1)
Sodium: 137 mmol/L (ref 135–145)
Total Bilirubin: 0.5 mg/dL (ref 0.3–1.2)
Total Protein: 8 g/dL (ref 6.5–8.1)

## 2022-05-12 LAB — POC URINE PREG, ED: Preg Test, Ur: NEGATIVE

## 2022-05-12 LAB — LIPASE, BLOOD: Lipase: 26 U/L (ref 11–51)

## 2022-05-12 MED ORDER — DICYCLOMINE HCL 20 MG PO TABS: 20.0000 mg | ORAL_TABLET | Freq: Two times a day (BID) | ORAL | 0 refills | Status: DC

## 2022-05-12 MED ORDER — ONDANSETRON 4 MG PO TBDP
ORAL_TABLET | ORAL | Status: AC
  Filled 2022-05-12: qty 1

## 2022-05-12 MED ORDER — ONDANSETRON 4 MG PO TBDP: 4.0000 mg | ORAL_TABLET | Freq: Three times a day (TID) | ORAL | 0 refills | Status: DC | PRN

## 2022-05-12 MED ORDER — ONDANSETRON 4 MG PO TBDP
4.0000 mg | ORAL_TABLET | Freq: Once | ORAL | Status: AC
  Administered 2022-05-12: 4 mg via ORAL

## 2022-05-12 NOTE — ED Provider Notes (Signed)
[facility]    CSN: [id number] Arrival date & time: 05/12/22  0801      History   Chief Complaint Chief Complaint  Patient presents with   Abdominal Pain    HPI Robin Strickland is a 21 y.o. female.   Patient presents today with a 2-day history of generalized abdominal pain with associated nausea.  Denies any vomiting, diarrhea, melena, hematochezia, hematemesis, fever.  Denies any urinary symptoms but does report that she has had increased vaginal bleeding she has been on her menstrual cycle for the past 2 weeks.  She does have an IUD in place but generally does not have irregular bleeding with the IUD.  This was placed in 2021.  She denies any medication changes, antibiotic use, suspicious food intake, known sick contacts, recent travel.  Denies history of abdominal surgery including cholecystectomy or appendectomy.  She is no concern for pregnancy.  Denies any vaginal discharge or pelvic pain.  She has not tried any over-the-counter medication for symptom management.  She reports pain is rated 10 on a 0-10 pain scale, generalized throughout abdomen, described as aching, no aggravating or alleviating factors identified.    Past Medical History:  Diagnosis Date   Gestational diabetes    During 1st pregnancy.   Headache    History of gestational diabetes 04/19/2016   2021: early a1c negative.    UTI (urinary tract infection) during pregnancy 07/02/2019   '[ ]'$  needs toc: neg    Patient Active Problem List   Diagnosis Date Noted   Chronic migraine w/o aura w/o status migrainosus, not intractable 07/31/2020   Supervision of other normal pregnancy, antepartum 06/06/2019   Migraine without aura and without status migrainosus, not intractable 10/28/2017    Past Surgical History:  Procedure Laterality Date   NO PAST SURGERIES      OB History     Gravida  2   Para  2   Term  2   Preterm      AB      Living  2      SAB      IAB       Ectopic      Multiple  0   Live Births  2            Home Medications    Prior to Admission medications   Medication Sig Start Date End Date Taking? Authorizing Provider  dicyclomine (BENTYL) 20 MG tablet Take 1 tablet (20 mg total) by mouth 2 (two) times daily. 05/12/22  Yes Tan Clopper K, PA-C  ondansetron (ZOFRAN-ODT) 4 MG disintegrating tablet Take 1 tablet (4 mg total) by mouth every 8 (eight) hours as needed for nausea or vomiting. 05/12/22   Marisa Hufstetler, Derry Skill, PA-C    Family History Family History  Problem Relation Age of Onset   Hypertension Mother    Kidney disease Mother    Other Father        unsure of medical history   Hypertension Maternal Grandmother    Kidney disease Maternal Grandmother     Social History Social History   Tobacco Use   Smoking status: Never   Smokeless tobacco: Never  Vaping Use   Vaping Use: Never used  Substance Use Topics   Alcohol use: No   Drug use: No     Allergies   Patient has no known allergies.   Review of Systems Review of Systems  Constitutional:  Positive for activity change and appetite  change. Negative for fatigue and fever.  Respiratory:  Negative for cough and shortness of breath.   Cardiovascular:  Negative for chest pain.  Gastrointestinal:  Positive for abdominal pain and nausea. Negative for blood in stool, constipation, diarrhea and vomiting.  Genitourinary:  Positive for vaginal bleeding. Negative for dysuria, frequency, pelvic pain, urgency, vaginal discharge and vaginal pain.     Physical Exam Triage Vital Signs ED Triage Vitals [05/12/22 0817]  Enc Vitals Group     BP 129/78     Pulse Rate 82     Resp 16     Temp 99.2 F (37.3 C)     Temp Source Oral     SpO2 100 %     Weight      Height      Head Circumference      Peak Flow      Pain Score 10     Pain Loc      Pain Edu?      Excl. in [place]?    No data found.  Updated Vital Signs BP 129/78 (BP Location: Left Arm)   Pulse 82   Temp  99.2 F (37.3 C) (Oral)   Resp 16   LMP 04/29/2022 (Exact Date)   SpO2 100%   Visual Acuity Right Eye Distance:   Left Eye Distance:   Bilateral Distance:    Right Eye Near:   Left Eye Near:    Bilateral Near:     Physical Exam Vitals reviewed.  Constitutional:      General: She is awake. She is not in acute distress.    Appearance: Normal appearance. She is well-developed. She is not ill-appearing.     Comments: Very pleasant female appears stated age in no acute distress sitting comfortably in exam room  HENT:     Head: Normocephalic and atraumatic.  Cardiovascular:     Rate and Rhythm: Normal rate and regular rhythm.     Heart sounds: Normal heart sounds, S1 normal and S2 normal. No murmur heard. Pulmonary:     Effort: Pulmonary effort is normal.     Breath sounds: Normal breath sounds. No wheezing, rhonchi or rales.     Comments: Clear to auscultation bilaterally Abdominal:     General: Bowel sounds are normal.     Palpations: Abdomen is soft.     Tenderness: There is generalized abdominal tenderness and tenderness in the epigastric area. There is no right CVA tenderness, left CVA tenderness, guarding or rebound. Negative signs include Rovsing's sign, McBurney's sign and psoas sign.     Comments: Generalized tenderness to palpation throughout abdomen.  No evidence of acute abdomen on physical exam.  No focal tenderness.  Negative McBurney point tenderness, Rovsing sign, iliopsoas sign.  Psychiatric:        Behavior: Behavior is cooperative.      UC Treatments / Results  Labs (all labs ordered are listed, but only abnormal results are displayed) Labs Reviewed  POCT URINALYSIS DIPSTICK, ED / UC - Abnormal; Notable for the following components:      Result Value   Hgb urine dipstick MODERATE (*)    Leukocytes,Ua SMALL (*)    All other components within normal limits  URINE CULTURE  CBC WITH DIFFERENTIAL/PLATELET  COMPREHENSIVE METABOLIC PANEL  LIPASE, BLOOD  POC  URINE PREG, ED    EKG   Radiology No results found.  Procedures Procedures (including critical care time)  Medications Ordered in UC Medications  ondansetron (ZOFRAN-ODT) disintegrating tablet 4  mg (4 mg Oral Given 05/12/22 0843)    Initial Impression / Assessment and Plan / UC Course  I have reviewed the triage vital signs and the nursing notes.  Pertinent labs & imaging results that were available during my care of the patient were reviewed by me and considered in my medical decision making (see chart for details).     Patient is well-appearing, afebrile, nontoxic, nontachycardic.  No evidence of acute abdomen on physical exam.  Vital signs and physical exam are reassuring.  Urine pregnancy was negative.  UA showed trace leukocytes and hemoglobin but was otherwise normal.  Hemoglobin is likely related to menstrual bleeding.  Will send this for culture but defer antibiotics until results are available.  CBC, CMP, lipase were obtained and are pending.  She was given Zofran in clinic with improvement of symptoms.  She did have decreasing abdominal discomfort but continued to have some pain in her abdomen.  She was sent home with a prescription for Zofran with instruction to use this every 8 hours as needed.  She is a bland diet and drink plenty of fluid.  Discussed that we do not have imaging to abilities in urgent care and if she has any persistent or changing/worsening abdominal pain she needs to go to the emergency room immediately.  She was also prescribed dicyclomine to help with her abdominal cramping/pain.  Recommended using this twice a day.  She is to follow-up with gastroenterology and she was given contact information for local provider with instruction to call to schedule an appointment.  Recommended close follow-up with her primary care.  Discussed that if she has any changing or worsening symptoms including worsening abdominal pain, fever, nausea/vomiting interfere with oral  intake, weakness, melena, hematochezia, hematemesis she needs to go to the emergency room immediately.  Strict return precautions given.  Work excuse note provided.  Final Clinical Impressions(s) / UC Diagnoses   Final diagnoses:  Generalized abdominal pain  Epigastric abdominal tenderness with rebound tenderness  Nausea without vomiting     Discharge Instructions      We will contact you if your blood work is abnormal.  Continue using Zofran every 8 hours as needed.  Eat a bland diet and avoid spicy/acidic/fatty foods.  Make sure that you rest and drink plenty of fluid.  Please follow-up with your PCP as soon as possible.  I also recommend that you call and schedule an appointment with gastroenterology.  Use dicyclomine to help manage your cramping symptoms twice a day.  As we discussed, if your symptoms are not improving or if anything worsens you need to go to the emergency room for imaging since we do not have those capabilities in urgent care.  If you have any vomiting, fever, worsening abdominal pain, blood in your stool, blood in your vomit, weakness need to go to the emergency room immediately.     ED Prescriptions     Medication Sig Dispense Auth. Provider   ondansetron (ZOFRAN-ODT) 4 MG disintegrating tablet Take 1 tablet (4 mg total) by mouth every 8 (eight) hours as needed for nausea or vomiting. 20 tablet Latoshia Monrroy K, PA-C   dicyclomine (BENTYL) 20 MG tablet Take 1 tablet (20 mg total) by mouth 2 (two) times daily. 20 tablet Karlie Aung, Derry Skill, PA-C      PDMP not reviewed this encounter.   Terrilee Croak, PA-C 05/12/22 1941

## 2022-05-12 NOTE — ED Triage Notes (Signed)
Pt states generalized abd pain with nausea and bloating for the past 2 days.  Also C/o lower back pain.

## 2022-05-12 NOTE — Discharge Instructions (Signed)
We will contact you if your blood work is abnormal.  Continue using Zofran every 8 hours as needed.  Eat a bland diet and avoid spicy/acidic/fatty foods.  Make sure that you rest and drink plenty of fluid.  Please follow-up with your PCP as soon as possible.  I also recommend that you call and schedule an appointment with gastroenterology.  Use dicyclomine to help manage your cramping symptoms twice a day.  As we discussed, if your symptoms are not improving or if anything worsens you need to go to the emergency room for imaging since we do not have those capabilities in urgent care.  If you have any vomiting, fever, worsening abdominal pain, blood in your stool, blood in your vomit, weakness need to go to the emergency room immediately.

## 2022-05-13 ENCOUNTER — Emergency Department (HOSPITAL_COMMUNITY): Payer: Medicaid Other

## 2022-05-13 ENCOUNTER — Other Ambulatory Visit: Payer: Self-pay

## 2022-05-13 ENCOUNTER — Emergency Department (HOSPITAL_COMMUNITY)
Admission: EM | Admit: 2022-05-13 | Discharge: 2022-05-13 | Disposition: A | Discharge status: Home / Self Care | Payer: Medicaid Other | Attending: Emergency Medicine | Admitting: Emergency Medicine

## 2022-05-13 DIAGNOSIS — R1030 Lower abdominal pain, unspecified: Secondary | ICD-10-CM

## 2022-05-13 DIAGNOSIS — R1110 Vomiting, unspecified: Secondary | ICD-10-CM | POA: Diagnosis not present

## 2022-05-13 DIAGNOSIS — N3001 Acute cystitis with hematuria: Secondary | ICD-10-CM

## 2022-05-13 DIAGNOSIS — R109 Unspecified abdominal pain: Secondary | ICD-10-CM | POA: Diagnosis not present

## 2022-05-13 LAB — I-STAT BETA HCG BLOOD, ED (MC, WL, AP ONLY): I-stat hCG, quantitative: <5 m[IU]/mL (ref <5)

## 2022-05-13 LAB — RAPID URINE DRUG SCREEN, HOSP PERFORMED
Amphetamines: NOT DETECTED
Barbiturates: NOT DETECTED
Benzodiazepines: NOT DETECTED
Cocaine: NOT DETECTED
Opiates: NOT DETECTED
Tetrahydrocannabinol: NOT DETECTED

## 2022-05-13 LAB — CBC WITH DIFFERENTIAL/PLATELET
Abs Immature Granulocytes: 0.05 10*3/uL (ref 0.00–0.07)
Basophils Absolute: 0 10*3/uL (ref 0.0–0.1)
Basophils Relative: 0 %
Eosinophils Absolute: 0.2 10*3/uL (ref 0.0–0.5)
Eosinophils Relative: 1 %
HCT: 38.5 % (ref 36.0–46.0)
Hemoglobin: 12.8 g/dL (ref 12.0–15.0)
Immature Granulocytes: 0 %
Lymphocytes Relative: 18 %
Lymphs Abs: 2.6 10*3/uL (ref 0.7–4.0)
MCH: 30 pg (ref 26.0–34.0)
MCHC: 33.2 g/dL (ref 30.0–36.0)
MCV: 90.2 fL (ref 80.0–100.0)
Monocytes Absolute: 0.7 10*3/uL (ref 0.1–1.0)
Monocytes Relative: 5 %
Neutro Abs: 10.7 10*3/uL — ABNORMAL HIGH (ref 1.7–7.7)
Neutrophils Relative %: 76 %
Platelets: 400 10*3/uL (ref 150–400)
RBC: 4.27 MIL/uL (ref 3.87–5.11)
RDW: 13.5 % (ref 11.5–15.5)
WBC: 14.3 10*3/uL — ABNORMAL HIGH (ref 4.0–10.5)
nRBC: 0 % (ref 0.0–0.2)

## 2022-05-13 LAB — LIPASE, BLOOD: Lipase: 26 U/L (ref 11–51)

## 2022-05-13 LAB — URINALYSIS, ROUTINE W REFLEX MICROSCOPIC
Bilirubin Urine: NEGATIVE
Glucose, UA: NEGATIVE mg/dL
Ketones, ur: NEGATIVE mg/dL
Nitrite: NEGATIVE
Protein, ur: NEGATIVE mg/dL
Specific Gravity, Urine: 1.029 (ref 1.005–1.030)
pH: 5 (ref 5.0–8.0)

## 2022-05-13 LAB — COMPREHENSIVE METABOLIC PANEL
ALT: 22 U/L (ref 0–44)
AST: 19 U/L (ref 15–41)
Albumin: 3.7 g/dL (ref 3.5–5.0)
Alkaline Phosphatase: 75 U/L (ref 38–126)
Anion gap: 8 (ref 5–15)
BUN: 12 mg/dL (ref 6–20)
CO2: 25 mmol/L (ref 22–32)
Calcium: 9 mg/dL (ref 8.9–10.3)
Chloride: 103 mmol/L (ref 98–111)
Creatinine, Ser: 0.72 mg/dL (ref 0.44–1.00)
GFR, Estimated: >60 mL/min (ref >60)
Glucose, Bld: 123 mg/dL — ABNORMAL HIGH (ref 70–99)
Potassium: 4.5 mmol/L (ref 3.5–5.1)
Sodium: 136 mmol/L (ref 135–145)
Total Bilirubin: 0.4 mg/dL (ref 0.3–1.2)
Total Protein: 8.3 g/dL — ABNORMAL HIGH (ref 6.5–8.1)

## 2022-05-13 LAB — URINE CULTURE

## 2022-05-13 MED ORDER — MORPHINE SULFATE (PF) 4 MG/ML IV SOLN: 4.0000 mg | Freq: Once | INTRAVENOUS | Status: DC

## 2022-05-13 MED ORDER — HYDROCODONE-ACETAMINOPHEN 5-325 MG PO TABS
1.0000 | ORAL_TABLET | Freq: Once | ORAL | Status: AC
  Administered 2022-05-13: 1 via ORAL
  Filled 2022-05-13: qty 1

## 2022-05-13 MED ORDER — MORPHINE SULFATE (PF) 2 MG/ML IV SOLN
INTRAVENOUS | Status: AC
  Administered 2022-05-13: 4 mg via INTRAVENOUS
  Filled 2022-05-13: qty 2

## 2022-05-13 MED ORDER — LACTATED RINGERS IV BOLUS
1000.0000 mL | Freq: Once | INTRAVENOUS | Status: AC
  Administered 2022-05-13: 1000 mL via INTRAVENOUS

## 2022-05-13 MED ORDER — SODIUM CHLORIDE 0.9 % IV SOLN
1.0000 g | Freq: Once | INTRAVENOUS | Status: AC
  Administered 2022-05-13: 1 g via INTRAVENOUS
  Filled 2022-05-13: qty 10

## 2022-05-13 MED ORDER — ONDANSETRON 4 MG PO TBDP
4.0000 mg | ORAL_TABLET | Freq: Once | ORAL | Status: AC
  Administered 2022-05-13: 4 mg via ORAL
  Filled 2022-05-13: qty 1

## 2022-05-13 MED ORDER — CEPHALEXIN 500 MG PO CAPS: 500.0000 mg | ORAL_CAPSULE | Freq: Four times a day (QID) | ORAL | 0 refills | Status: AC

## 2022-05-13 MED ORDER — IOHEXOL 350 MG/ML SOLN
75.0000 mL | Freq: Once | INTRAVENOUS | Status: AC | PRN
  Administered 2022-05-13: 75 mL via INTRAVENOUS

## 2022-05-13 NOTE — ED Provider Triage Note (Signed)
  Emergency Medicine Provider Triage Evaluation Note  MRN:  [id number]  Arrival date & time: 05/13/22    Medically screening exam initiated at 12:32 AM.   CC:   Abdominal Pain   HPI:  Robin Strickland is a 21 y.o. year-old female presents to the ED with chief complaint of generalized abdominal pain for the past few days.  Worsened tonight.  Has had vomiting. Denies dysuria.  Denies prior surgeries.  History provided by patient. ROS:  -As included in HPI PE:   Vitals:   05/13/22 0024  BP: 122/82  Pulse: 78  Resp: 19  Temp: 98 F (36.7 C)  SpO2: 100%    Non-toxic appearing No respiratory distress  MDM:  Undifferentiated abdominal pain.  Multiple possible etiologies. I've ordered labs and imagin in triage to expedite lab/diagnostic workup.  Patient was informed that the remainder of the evaluation will be completed by another provider, this initial triage assessment does not replace that evaluation, and the importance of remaining in the ED until their evaluation is complete.    Montine Circle, PA-C 05/13/22 [phone]

## 2022-05-13 NOTE — ED Notes (Signed)
Patient complaining of pain during Down Time. Pt brought to triage and '4mg'$  Morphine IV Verbal Read Back Ordered by Rob, PA. Was administered. Unable to document and scan in real time due to EPIC down time.

## 2022-05-13 NOTE — ED Triage Notes (Signed)
Patient reports persistent pain across abdomen for 2 days with emesis unrelieved by prescription medications . No fever or diarrhea .

## 2022-05-13 NOTE — Discharge Instructions (Signed)
While in the emergency department you were diagnosed with urinary tract infection.  You were given a dose of antibiotics in the emergency department and a prescription for an outpatient course.  Please follow-up with your primary care doctor in 24 to 48 hours and return to the emergency department if you have any worsening of your symptoms.

## 2022-05-13 NOTE — ED Provider Notes (Signed)
[facility] EMERGENCY DEPARTMENT Provider Note   CSN: [id number] Arrival date & time: 05/12/22  2315     History  Chief Complaint  Patient presents with   Abdominal Pain    Robin Strickland is a 21 y.o. female.   Abdominal Pain  The patient is a 21 year old female with past medical history of urinary tract infection presenting for evaluation of abdominal pain.  The patient states that she has had 3 days of diffuse abdominal pain with point maximal impulse in the suprapubic region.  She was seen at an urgent care yesterday where she was given a prescription for Zofran and Bentyl and strict return precautions.  Since going home the patient began vomiting.  She had approximately 5 episodes of emesis at home yesterday with several more here in the emergency department while waiting to be seen.  She describes a single episode of blood-streaked emesis.  The patient denies fevers, cough, shortness of breath, chest pain, dysuria, polyuria, or diarrhea.  She does not report any aggravating or alleviating factors regarding her pain.     Home Medications Prior to Admission medications   Medication Sig Start Date End Date Taking? Authorizing Provider  cephALEXin (KEFLEX) 500 MG capsule Take 1 capsule (500 mg total) by mouth 4 (four) times daily for 7 days. 05/13/22 05/20/22 Yes Dani Gobble, MD  dicyclomine (BENTYL) 20 MG tablet Take 1 tablet (20 mg total) by mouth 2 (two) times daily. 05/12/22   Raspet, Erin K, PA-C  ondansetron (ZOFRAN-ODT) 4 MG disintegrating tablet Take 1 tablet (4 mg total) by mouth every 8 (eight) hours as needed for nausea or vomiting. 05/12/22   Raspet, Derry Skill, PA-C      Allergies    Patient has no known allergies.    Review of Systems   Review of Systems  Gastrointestinal:  Positive for abdominal pain.  See HPI  Physical Exam Updated Vital Signs BP 124/75 (BP Location: Left Arm)   Pulse 80   Temp 98 F (36.7 C) (Oral)   Resp 16    LMP 04/29/2022 (Exact Date)   SpO2 100%  Physical Exam Vitals and nursing note reviewed.  Constitutional:      General: She is not in acute distress.    Appearance: She is well-developed.  HENT:     Head: Normocephalic and atraumatic.  Eyes:     Conjunctiva/sclera: Conjunctivae normal.  Cardiovascular:     Rate and Rhythm: Normal rate and regular rhythm.     Heart sounds: No murmur heard. Pulmonary:     Effort: Pulmonary effort is normal. No respiratory distress.     Breath sounds: Normal breath sounds.  Abdominal:     Palpations: Abdomen is soft.     Comments: Mild diffuse abdominal tenderness to palpation most prominent over the suprapubic region.  Musculoskeletal:        General: No swelling.     Cervical back: Neck supple.  Skin:    General: Skin is warm and dry.     Capillary Refill: Capillary refill takes less than 2 seconds.  Neurological:     Mental Status: She is alert.  Psychiatric:        Mood and Affect: Mood normal.     ED Results / Procedures / Treatments   Labs (all labs ordered are listed, but only abnormal results are displayed) Labs Reviewed  COMPREHENSIVE METABOLIC PANEL - Abnormal; Notable for the following components:      Result Value  Glucose, Bld 123 (*)    Total Protein 8.3 (*)    All other components within normal limits  CBC WITH DIFFERENTIAL/PLATELET - Abnormal; Notable for the following components:   WBC 14.3 (*)    Neutro Abs 10.7 (*)    All other components within normal limits  URINALYSIS, ROUTINE W REFLEX MICROSCOPIC - Abnormal; Notable for the following components:   APPearance HAZY (*)    Hgb urine dipstick LARGE (*)    Leukocytes,Ua MODERATE (*)    Bacteria, UA FEW (*)    All other components within normal limits  LIPASE, BLOOD  RAPID URINE DRUG SCREEN, HOSP PERFORMED  I-STAT BETA HCG BLOOD, ED ([facility], [facility], [facility] ONLY)    EKG None  Radiology CT ABDOMEN PELVIS W CONTRAST  Result Date: 05/13/2022 CLINICAL DATA:  21 year old  female with 2 days of abdominal pain and vomiting. EXAM: CT ABDOMEN AND PELVIS WITH CONTRAST TECHNIQUE: Multidetector CT imaging of the abdomen and pelvis was performed using the standard protocol following bolus administration of intravenous contrast. RADIATION DOSE REDUCTION: This exam was performed according to the departmental dose-optimization program which includes automated exposure control, adjustment of the mA and/or kV according to patient size and/or use of iterative reconstruction technique. CONTRAST:  24m OMNIPAQUE IOHEXOL 350 MG/ML SOLN COMPARISON:  None Available. FINDINGS: Lower chest: Negative; minimal lung base atelectasis. Hepatobiliary: Negative liver and gallbladder. No bile duct enlargement. Pancreas: Negative. Spleen: Negative. Adrenals/Urinary Tract: Normal adrenal glands. Symmetric renal enhancement. Left renal lower pole 4-5 mm calculus. But no hydronephrosis or pararenal inflammation. Visible ureters appear decompressed and normal. Occasional subcentimeter renal cysts (no follow-up imaging recommended). Diminutive and unremarkable bladder. Stomach/Bowel: Decompressed rectosigmoid colon in the pelvis. Upstream abundant large bowel retained gas and stool. Both the sigmoid and splenic flexure are redundant. Long retrocecal appendix is mostly gas-filled and remains within normal limits (coronal image 30). No large bowel inflammation. Decompressed terminal ileum. No dilated small bowel. Decompressed stomach and duodenum. No free air, free fluid, or mesenteric inflammation identified. Vascular/Lymphatic: Major arterial structures and the portal venous system appear patent and normal. No lymphadenopathy identified. Reproductive: Retroverted uterus. IUD with no definite adverse features. Other: No pelvis free fluid. Musculoskeletal: Negative. IMPRESSION: A no acute or inflammatory process identified in the abdomen or pelvis. Mild left nephrolithiasis, but no obstructive uropathy. Retrocecal  appendix is within normal limits. Electronically Signed   By: HGenevie AnnM.D.   On: 05/13/2022 04:24    Procedures Procedures    Medications Ordered in ED Medications  morphine (PF) 4 MG/ML injection 4 mg (4 mg Intravenous Not Given 05/13/22 0325)  cefTRIAXone (ROCEPHIN) 1 g in sodium chloride 0.9 % 100 mL IVPB (1 g Intravenous New Bag/Given 05/13/22 0822)  lactated ringers bolus 1,000 mL (has no administration in time range)  ondansetron (ZOFRAN-ODT) disintegrating tablet 4 mg (4 mg Oral Given 05/13/22 0052)  HYDROcodone-acetaminophen (NORCO/VICODIN) 5-325 MG per tablet 1 tablet (1 tablet Oral Given 05/13/22 0051)  morphine (PF) 2 MG/ML injection (4 mg Intravenous Given 05/13/22 0239)  iohexol (OMNIPAQUE) 350 MG/ML injection 75 mL (75 mLs Intravenous Contrast Given 05/13/22 0409)    ED Course/ Medical Decision Making/ A&P                           Medical Decision Making  The patient is a 21year old female with past medical history of urinary tract infection presenting for evaluation of abdominal pain.  The differential diagnosis considered includes: Cystitis, pyelonephritis,  SBO, appendicitis, cholecystitis, pancreatitis pregnancy, torsion.  On initial evaluation, the patient is hemodynamically stable and afebrile.  Her physical exam was significant for mild diffuse tenderness to palpation more significant over the suprapubic region.  I personally reviewed and interpreted all parts of patient's diagnostic workup which included a beta-hCG which was negative; lipase which was within normal limits; CBC with white blood cell count elevated at 14.3 and hemoglobin of 12.8; CMP with glucose of 123 but otherwise unremarkable; urinalysis which showed moderate leukocytes with 21-50 urine WBCs and few bacteria.  She was also noted to have large volume hemoglobin on urine dipstick which is favored to be menstrual bleeding.  The patient also received a CT abdomen pelvis which did not show signs of  intra-abdominal infection or obstructive process.  Based on the patient's history, physical exam, and diagnostic workup a urinary tract infection is favored to be the likely source of the patient's symptoms.  While other causes such as small bowel obstruction, pancreatitis, pyelonephritis, appendicitis, torsion were considered, they were determined to be less likely based on the patient's absence of fevers, absence of CVA tenderness on physical exam, and laboratory workup and advanced imaging.    While in the emergency department, the patient was given a bolus of IV fluids, IV pain medication, and a dose of antibiotics.  She was discharged with an outpatient prescription for antibiotics instructions to follow-up with her PCP in 24-48 hours for reevaluation.  She was also given strict return precautions to the emergency department.  Amount and/or Complexity of Data Reviewed External Data Reviewed: labs and notes. Labs: ordered. Decision-making details documented in ED Course.  Risk Prescription drug management.   Patient's presentation is most consistent with acute complicated illness / injury requiring diagnostic workup.         Final Clinical Impression(s) / ED Diagnoses Final diagnoses:  Lower abdominal pain  Acute cystitis with hematuria    Rx / DC Orders ED Discharge Orders          Ordered    cephALEXin (KEFLEX) 500 MG capsule  4 times daily        05/13/22 0835              Dani Gobble, MD 05/13/22 1000    Lajean Saver, MD 05/13/22 1423

## 2022-06-02 ENCOUNTER — Other Ambulatory Visit (HOSPITAL_COMMUNITY)
Admission: RE | Admit: 2022-06-02 | Discharge: 2022-06-02 | Disposition: A | Discharge status: Home / Self Care | Payer: Medicaid Other | Source: Ambulatory Visit | Attending: Primary Care | Admitting: Primary Care

## 2022-06-02 ENCOUNTER — Ambulatory Visit (INDEPENDENT_AMBULATORY_CARE_PROVIDER_SITE_OTHER): Payer: Self-pay | Admitting: *Deleted

## 2022-06-02 ENCOUNTER — Ambulatory Visit (INDEPENDENT_AMBULATORY_CARE_PROVIDER_SITE_OTHER): Payer: Medicaid Other | Admitting: Primary Care

## 2022-06-02 ENCOUNTER — Encounter (INDEPENDENT_AMBULATORY_CARE_PROVIDER_SITE_OTHER): Payer: Self-pay | Admitting: Primary Care

## 2022-06-02 DIAGNOSIS — R1084 Generalized abdominal pain: Secondary | ICD-10-CM | POA: Diagnosis not present

## 2022-06-02 DIAGNOSIS — N200 Calculus of kidney: Secondary | ICD-10-CM

## 2022-06-02 LAB — POCT URINALYSIS DIP (CLINITEK)
Blood, UA: NEGATIVE
Glucose, UA: NEGATIVE mg/dL
Ketones, POC UA: NEGATIVE mg/dL
Nitrite, UA: NEGATIVE
Spec Grav, UA: >=1.03 — AB (ref 1.010–1.025)
Urobilinogen, UA: 1 E.U./dL
pH, UA: 6 (ref 5.0–8.0)

## 2022-06-02 LAB — POCT URINE PREGNANCY: Preg Test, Ur: NEGATIVE

## 2022-06-02 NOTE — Addendum Note (Signed)
Addended by: Carilyn Goodpasture on: 06/02/2022 12:22 PM   Modules accepted: Orders

## 2022-06-02 NOTE — Progress Notes (Signed)
[facility] Subjective:     Robin Strickland is a 21 y.o. female who presents for evaluation of abdominal pain. Onset was 2 days ago. Symptoms have been gradually worsening. The pain is described as burning and cramping, and is 7/10 in intensity. Pain is located in the RUQ, LUQ, epigastric region, periumbilical region, LLQ, and RLQ with radiation to back.  Aggravating factors: sitting up.  Alleviating factors: none. Associated symptoms: belching and flatus. The patient denies anorexia, constipation, diarrhea, fever, headache, nausea, and vomiting.  The patient's history has been marked as reviewed and updated as appropriate. allergies, current medications, past family history, past medical history, past social history, past surgical history, and problem list Past Medical History:  Diagnosis Date   Gestational diabetes    During 1st pregnancy.   Headache    History of gestational diabetes 04/19/2016   2021: early a1c negative.    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 status migrainosus, not intractable 07/31/2020   Supervision of other normal pregnancy, antepartum 06/06/2019   Migraine without aura and without status migrainosus, not intractable 10/28/2017   Past Surgical History:  Procedure Laterality Date   NO PAST SURGERIES     Family History  Problem Relation Age of Onset   Hypertension Mother    Kidney disease Mother    Other Father        unsure of medical history   Hypertension Maternal Grandmother    Kidney disease Maternal Grandmother    Social History   Socioeconomic History   Marital status: Married    Spouse name: Not on file   Number of children: 2   Years of education: 12   Highest education level: High school graduate  Occupational History   Occupation: Occupational psychologist  Tobacco Use   Smoking status:  Never   Smokeless tobacco: Never  Vaping Use   Vaping Use: Never used  Substance and Sexual Activity   Alcohol use: No   Drug use: No   Sexual activity: Yes    Birth control/protection: I.U.D.  Other Topics Concern   Not on file  Social History Narrative   Lives at home with family.   Right-handed.   No daily use of caffeine, occasional iced coffee or soda.   Social Determinants of Health   Financial Resource Strain: Not on file  Food Insecurity: Not on file  Transportation Needs: Not on file  Physical Activity: Not on file  Stress: Not on file  Social Connections: Not on file    Review of Systems Pertinent items noted in HPI and remainder of comprehensive ROS otherwise negative.     Objective:    Last Menstrual Period 05/02/2022 (Exact Date) Comment: on IUD General appearance: alert, cooperative, appears stated age, mild distress, and mildly obese Head: Normocephalic, without obvious abnormality, atraumatic Neck: no adenopathy, no carotid bruit, no JVD, supple, symmetrical, trachea midline, and thyroid not enlarged, symmetric, no tenderness/mass/nodules Lungs: clear to auscultation bilaterally Heart: regular rate and rhythm, S1, S2 normal, no murmur, click, rub or gallop Abdomen: soft, non-tender; bowel sounds normal; no masses,  no organomegaly Extremities: extremities normal, atraumatic, no cyanosis or edema Skin: Skin color, texture, turgor normal. No rashes or lesions Neurologic: Alert and oriented X 3, normal strength and tone. Normal symmetric reflexes. Normal coordination and gait   Robin Strickland was  seen today for abdominal pain.  Diagnoses and all orders for this visit:  Renal calculus -     Ambulatory referral to Urology    This note has been created with Dragon speech recognition software and Engineer, materials. Any transcriptional errors are unintentional.   Kerin Perna, NP 06/02/2022, 11:02 AM

## 2022-06-02 NOTE — Progress Notes (Signed)
Abdominal pain since 12/06 Bloating Was seen by UC 12/06 and 12/07 LMP was 04/29/2022 Was on ATB and completed

## 2022-06-02 NOTE — Telephone Encounter (Signed)
  Chief Complaint: abdominal pain with bloating and swelling  Symptoms: abdominal pain all over with swelling and radiates to back , chest heaviness with breathing in deep at times, nausea   Frequency: 2 days  Pertinent Negatives: Patient denies chest pain feels heavy at times. No difficulty breathing no fever no vomiting  Disposition: '[]'$ ED /'[]'$ Urgent Care (no appt availability in office) / '[x]'$ Appointment(In office/virtual)/ '[]'$  [facility]/ '[]'$ Home Care/ '[]'$ Refused Recommended Disposition /'[]'$ [facility]/ '[]'$  Follow-up with PCP Additional Notes:   Appt today .patient reports she is 12 minutes away from Office.  Recommended if worsening sx go to ED.      Reason for Disposition  [1] MILD-MODERATE pain AND [2] constant AND [3] present > 2 hours  Answer Assessment - Initial Assessment Questions 1. LOCATION: "Where does it hurt?"      Whole abdomen  2. RADIATION: "Does the pain shoot anywhere else?" (e.g., chest, back)     To back  3. ONSET: "When did the pain begin?" (e.g., minutes, hours or days ago)      2 days ago  4. SUDDEN: "Gradual or sudden onset?"     na 5. PATTERN "Does the pain come and go, or is it constant?"    - If it comes and goes: "How long does it last?" "Do you have pain now?"     (Note: Comes and goes means the pain is intermittent. It goes away completely between bouts.)    - If constant: "Is it getting better, staying the same, or getting worse?"      (Note: Constant means the pain never goes away completely; most serious pain is constant and gets worse.)      constant 6. SEVERITY: "How bad is the pain?"  (e.g., Scale 1-10; mild, moderate, or severe)    - MILD (1-3): Doesn't interfere with normal activities, abdomen soft and not tender to touch.     - MODERATE (4-7): Interferes with normal activities or awakens from sleep, abdomen tender to touch.     - SEVERE (8-10): Excruciating pain, doubled over, unable to do any normal activities.        Moderate abdominal swelling noted  7. RECURRENT SYMPTOM: "Have you ever had this type of stomach pain before?" If Yes, ask: "When was the last time?" and "What happened that time?"      Yes  8. CAUSE: "What do you think is causing the stomach pain?"     Not sure UTI 9. RELIEVING/AGGRAVATING FACTORS: "What makes it better or worse?" (e.g., antacids, bending or twisting motion, bowel movement)     na 10. OTHER SYMPTOMS: "Do you have any other symptoms?" (e.g., back pain, diarrhea, fever, urination pain, vomiting)       Back pain , nausea not eating or drinking very much  11. PREGNANCY: "Is there any chance you are pregnant?" "When was your last menstrual period?"       IUD  Protocols used: Abdominal Pain - [facility]

## 2022-06-04 LAB — CERVICOVAGINAL ANCILLARY ONLY
Bacterial Vaginitis (gardnerella): POSITIVE — AB
Candida Glabrata: NEGATIVE
Candida Vaginitis: POSITIVE — AB
Chlamydia: NEGATIVE
Comment: NEGATIVE
Comment: NEGATIVE
Comment: NEGATIVE
Comment: NEGATIVE
Comment: NEGATIVE
Comment: NORMAL
Neisseria Gonorrhea: NEGATIVE
Trichomonas: NEGATIVE

## 2022-06-08 ENCOUNTER — Other Ambulatory Visit (INDEPENDENT_AMBULATORY_CARE_PROVIDER_SITE_OTHER): Payer: Self-pay | Admitting: Primary Care

## 2022-06-08 MED ORDER — METRONIDAZOLE 500 MG PO TABS: 500.0000 mg | ORAL_TABLET | Freq: Two times a day (BID) | ORAL | 0 refills | Status: DC

## 2022-06-08 MED ORDER — FLUCONAZOLE 150 MG PO TABS: 150.0000 mg | ORAL_TABLET | Freq: Every day | ORAL | 1 refills | Status: DC

## 2022-07-20 ENCOUNTER — Ambulatory Visit: Payer: Medicaid Other | Admitting: Obstetrics and Gynecology

## 2022-09-22 ENCOUNTER — Encounter: Payer: Self-pay | Admitting: Obstetrics & Gynecology

## 2022-09-22 ENCOUNTER — Ambulatory Visit: Payer: Medicaid Other | Admitting: Obstetrics & Gynecology

## 2022-09-22 VITALS — BP 107/70 | HR 65 | Wt 168.0 lb

## 2022-09-22 DIAGNOSIS — Z30432 Encounter for removal of intrauterine contraceptive device: Secondary | ICD-10-CM

## 2022-09-22 NOTE — Progress Notes (Signed)
     GYNECOLOGY OFFICE PROCEDURE NOTE  Robin Strickland is a 22 y.o. [phone] here for Paragard IUD removal. This was inserted on 01/19/2020, patient wants to conceive soon.  No GYN concerns.  Last pap smear was on 02/12/2021 and was normal.  IUD Removal  Patient identified, informed consent performed, consent signed.  Patient was placed in the dorsal lithotomy position, normal external genitalia was noted.  A speculum was placed in the patient's vagina, normal discharge was noted, no lesions. The cervix was visualized, no lesions, no abnormal discharge.  The strings of the IUD were grasped and pulled using ring forceps. The IUD was removed in its entirety. Patient tolerated the procedure well.    Patient plans for pregnancy soon and she was told to avoid teratogens, take PNV and folic acid.  Routine preventative health maintenance measures emphasized.   Jaynie Collins, MD, FACOG Obstetrician & Gynecologist, [facility] for [employer], [facility]

## 2022-11-03 ENCOUNTER — Inpatient Hospital Stay (HOSPITAL_COMMUNITY)
Admission: AD | Admit: 2022-11-03 | Discharge: 2022-11-03 | Disposition: A | Discharge status: Home / Self Care | Payer: Medicaid Other | Attending: Obstetrics and Gynecology | Admitting: Obstetrics and Gynecology

## 2022-11-03 ENCOUNTER — Inpatient Hospital Stay (HOSPITAL_COMMUNITY): Payer: Medicaid Other

## 2022-11-03 ENCOUNTER — Encounter (HOSPITAL_COMMUNITY): Payer: Self-pay | Admitting: *Deleted

## 2022-11-03 DIAGNOSIS — O26891 Other specified pregnancy related conditions, first trimester: Secondary | ICD-10-CM | POA: Diagnosis not present

## 2022-11-03 DIAGNOSIS — O3680X0 Pregnancy with inconclusive fetal viability, not applicable or unspecified: Secondary | ICD-10-CM | POA: Diagnosis not present

## 2022-11-03 DIAGNOSIS — Z3A01 Less than 8 weeks gestation of pregnancy: Secondary | ICD-10-CM

## 2022-11-03 DIAGNOSIS — N939 Abnormal uterine and vaginal bleeding, unspecified: Secondary | ICD-10-CM | POA: Diagnosis not present

## 2022-11-03 DIAGNOSIS — T63301S Toxic effect of unspecified spider venom, accidental (unintentional), sequela: Secondary | ICD-10-CM

## 2022-11-03 DIAGNOSIS — O26859 Spotting complicating pregnancy, unspecified trimester: Secondary | ICD-10-CM | POA: Diagnosis not present

## 2022-11-03 DIAGNOSIS — R109 Unspecified abdominal pain: Secondary | ICD-10-CM | POA: Diagnosis not present

## 2022-11-03 DIAGNOSIS — O26851 Spotting complicating pregnancy, first trimester: Secondary | ICD-10-CM | POA: Diagnosis not present

## 2022-11-03 LAB — CBC
HCT: 36.5 % (ref 36.0–46.0)
Hemoglobin: 12.1 g/dL (ref 12.0–15.0)
MCH: 30.4 pg (ref 26.0–34.0)
MCHC: 33.2 g/dL (ref 30.0–36.0)
MCV: 91.7 fL (ref 80.0–100.0)
Platelets: 350 10*3/uL (ref 150–400)
RBC: 3.98 MIL/uL (ref 3.87–5.11)
RDW: 13.9 % (ref 11.5–15.5)
WBC: 14 10*3/uL — ABNORMAL HIGH (ref 4.0–10.5)
nRBC: 0 % (ref 0.0–0.2)

## 2022-11-03 LAB — WET PREP, GENITAL
Clue Cells Wet Prep HPF POC: NONE SEEN
Sperm: NONE SEEN
Trich, Wet Prep: NONE SEEN
WBC, Wet Prep HPF POC: <10 (ref <10)
Yeast Wet Prep HPF POC: NONE SEEN

## 2022-11-03 LAB — HCG, QUANTITATIVE, PREGNANCY: hCG, Beta Chain, Quant, S: 453 m[IU]/mL — ABNORMAL HIGH (ref <5)

## 2022-11-03 LAB — ABO/RH: ABO/RH(D): O POS

## 2022-11-03 LAB — URINALYSIS, ROUTINE W REFLEX MICROSCOPIC
Bacteria, UA: NONE SEEN
Glucose, UA: NEGATIVE mg/dL
Hgb urine dipstick: NEGATIVE
Ketones, ur: 20 mg/dL — AB
Leukocytes,Ua: NEGATIVE
Nitrite: NEGATIVE
Protein, ur: 30 mg/dL — AB
Specific Gravity, Urine: 1.03 (ref 1.005–1.030)
pH: 5 (ref 5.0–8.0)

## 2022-11-03 LAB — POCT PREGNANCY, URINE: Preg Test, Ur: POSITIVE — AB

## 2022-11-03 MED ORDER — CEPHALEXIN 500 MG PO CAPS: 500.0000 mg | ORAL_CAPSULE | Freq: Four times a day (QID) | ORAL | 0 refills | Status: DC

## 2022-11-03 NOTE — MAU Note (Signed)
Robin Strickland Robin Strickland is a 22 y.o. at Unknown here in MAU reporting: has a really bad boil on her left upper thigh, noted 2 days ago, getting worse, spreading, everything rubs on it.  Dr said she was 7 wks preg.  Has been experiencing pain all day, starts in the back comes around to the front.  Was spotting yesterday.  LMP: 4/10, IUD removed 4/17 Onset of complaint: 2 days ago Pain score: leg 8, back 7 Vitals:   11/03/22 1906  BP: 117/67  Pulse: 80  Resp: 17  Temp: 98.1 F (36.7 C)  SpO2: 100%      Lab orders placed from triage:  upt    Has not been seen yet.  7 wks by LMP.  Called them this morning, was told to come here.

## 2022-11-03 NOTE — MAU Provider Note (Signed)
History     CSN: [id number]  Arrival date and time: 11/03/22 1756   Event Date/Time   First Provider Initiated Contact with Patient 11/03/22 1951      Chief Complaint  Patient presents with  . Possible Pregnancy  . Back Pain  . Leg Pain  . Recurrent Skin Infections   Robin Strickland , a  22 y.o. [phone] at [redacted]w[redacted]d presents to MAU with complaints of back and abdominal pain, with vaginal spotting that started 2 days ago. She reports lower abdominal period like cramping that radiates to her back and pelvic area. She She denies worsening or alleviating symptoms. She currently rates pain a 7/10 but denies attempting to relieve pain. She also reports bright red vaginal bleeding with clots that started this morning. She states the bleeding has slowed down since this morning and reports only seeing it with wiping now. Last intercourse was last night. She endorses vaginal odor but nothing with a color. She denies vaginal irritation and urinary symptoms.   She also reports a boil on her left thigh. She states that it is red and warm to the touch. She states that is "burns" and is painful to walk. She states that she went to her doctor but was told that she could not be seen due to the pregnancy.        Possible Pregnancy Associated symptoms include abdominal pain and a rash. Pertinent negatives include no chest pain, chills, fatigue, fever, headaches, nausea, vomiting or weakness.  Back Pain Associated symptoms include abdominal pain and leg pain. Pertinent negatives include no chest pain, dysuria, fever, headaches, pelvic pain or weakness.  Leg Pain    OB History     Gravida  3   Para  2   Term  2   Preterm      AB      Living  2      SAB      IAB      Ectopic      Multiple  0   Live Births  2           Past Medical History:  Diagnosis Date  . Gestational diabetes    During 1st pregnancy.  . Headache   . History of gestational diabetes  04/19/2016   2021: early a1c negative.   Marland Kitchen UTI (urinary tract infection) during pregnancy 07/02/2019   [ ]  needs toc: neg    Past Surgical History:  Procedure Laterality Date  . NO PAST SURGERIES      Family History  Problem Relation Age of Onset  . Hypertension Mother   . Kidney disease Mother   . Other Father        unsure of medical history  . Hypertension Maternal Grandmother   . Kidney disease Maternal Grandmother     Social History   Tobacco Use  . Smoking status: Never  . Smokeless tobacco: Never  Vaping Use  . Vaping Use: Never used  Substance Use Topics  . Alcohol use: No  . Drug use: No    Allergies: No Known Allergies  Medications Prior to Admission  Medication Sig Dispense Refill Last Dose  . fluconazole (DIFLUCAN) 150 MG tablet Take 1 tablet (150 mg total) by mouth daily. (Patient not taking: Reported on 09/22/2022) 1 tablet 1   . metroNIDAZOLE (FLAGYL) 500 MG tablet Take 1 tablet (500 mg total) by mouth 2 (two) times daily. (Patient not taking: Reported on 09/22/2022) 14 tablet 0  Review of Systems  Constitutional:  Negative for chills, fatigue and fever.  Eyes:  Negative for pain and visual disturbance.  Respiratory:  Negative for apnea, shortness of breath and wheezing.   Cardiovascular:  Negative for chest pain and palpitations.  Gastrointestinal:  Positive for abdominal pain. Negative for constipation, diarrhea, nausea and vomiting.  Genitourinary:  Positive for vaginal bleeding. Negative for difficulty urinating, dysuria, pelvic pain, vaginal discharge and vaginal pain.  Musculoskeletal:  Positive for back pain.  Skin:  Positive for color change, rash and wound.  Neurological:  Negative for seizures, weakness and headaches.  Psychiatric/Behavioral:  Negative for suicidal ideas.    Physical Exam   Blood pressure 117/67, pulse 80, temperature 98.1 F (36.7 C), temperature source Oral, resp. rate 17, height 5\' 2"  (1.575 m), weight 77.6 kg, last  menstrual period 09/15/2022, SpO2 100 %, currently breastfeeding.  Physical Exam Vitals and nursing note reviewed.  Constitutional:      General: She is not in acute distress.    Appearance: Normal appearance.  HENT:     Head: Normocephalic.  Pulmonary:     Effort: Pulmonary effort is normal.  Musculoskeletal:        General: Normal range of motion.     Cervical back: Normal range of motion.       Legs:     Comments: Reddened Area about 6cm x 4cm tender and warm to the touch. In the center noted 2 puncture holes that are now draining serosanguinous fluid.   Skin:    General: Skin is warm and dry.     Capillary Refill: Capillary refill takes 2 to 3 seconds.  Neurological:     Mental Status: She is alert and oriented to person, place, and time.  Psychiatric:        Mood and Affect: Mood normal.    MAU Course  Procedures Orders Placed This Encounter  Procedures  . Wet prep, genital  . US OB LESS THAN 14 WEEKS WITH OB TRANSVAGINAL  . Urinalysis, Routine w reflex microscopic -Urine, Clean Catch  . CBC  . hCG, quantitative, pregnancy  . Diet NPO time specified  . Pregnancy, urine POC  . ABO/Rh    MDM - Wet prep normal  - UA amber, Hazy, small and 20 of ketones with 30 of protein.  -   Assessment and Plan  ***  Claudette Head 11/03/2022, 7:51 PM

## 2022-11-04 LAB — GC/CHLAMYDIA PROBE AMP (~~LOC~~) NOT AT ARMC
Chlamydia: NEGATIVE
Comment: NEGATIVE
Comment: NORMAL
Neisseria Gonorrhea: NEGATIVE

## 2022-11-05 ENCOUNTER — Ambulatory Visit: Payer: Medicaid Other | Admitting: Certified Nurse Midwife

## 2022-11-05 DIAGNOSIS — O3680X0 Pregnancy with inconclusive fetal viability, not applicable or unspecified: Secondary | ICD-10-CM

## 2022-11-06 LAB — BETA HCG QUANT (REF LAB): hCG Quant: 607 m[IU]/mL

## 2022-11-08 ENCOUNTER — Ambulatory Visit (INDEPENDENT_AMBULATORY_CARE_PROVIDER_SITE_OTHER): Payer: Medicaid Other | Admitting: General Practice

## 2022-11-08 ENCOUNTER — Other Ambulatory Visit: Payer: Self-pay

## 2022-11-08 ENCOUNTER — Telehealth: Payer: Self-pay

## 2022-11-08 VITALS — BP 122/74 | HR 93 | Ht 61.0 in | Wt 169.0 lb

## 2022-11-08 DIAGNOSIS — O3680X0 Pregnancy with inconclusive fetal viability, not applicable or unspecified: Secondary | ICD-10-CM

## 2022-11-08 DIAGNOSIS — O26899 Other specified pregnancy related conditions, unspecified trimester: Secondary | ICD-10-CM

## 2022-11-08 DIAGNOSIS — Z3A01 Less than 8 weeks gestation of pregnancy: Secondary | ICD-10-CM

## 2022-11-08 LAB — BETA HCG QUANT (REF LAB): hCG Quant: 1819 m[IU]/mL

## 2022-11-08 NOTE — Progress Notes (Signed)
Beta HCG Follow-up Visit  Robin Strickland presents to [facility] for follow-up beta HCG lab. She was seen in MAU for abdominal pain on 5/29. Patient denies pain or bleeding today. Discussed with patient that we are following beta HCG levels today. Results will be back in approximately 2 hours. Valid contact number for patient confirmed. I will call the patient with results.   Beta HCG results:          5/29         453           5/31         607           6/3        1,819   Results and patient history reviewed with Dr Vergie Living, who states bhcg rose appropriately, patient should have follow up ultrasound in 10-14 days. Scheduled 6/18 @ 830. Patient called and informed of plan for follow-up. Patient verbalized understanding.  [name] 11/08/2022 2:14 PM

## 2022-11-08 NOTE — Telephone Encounter (Addendum)
-----   Message from Rosholt Bing, MD sent at 11/07/2022 10:53 PM EDT ----- Not sure how this got put under my name, but this person needs a stat quant for Monday, June 3rd  Called pt; VM left. MyChart message sent.

## 2022-11-21 NOTE — Progress Notes (Signed)
error 

## 2022-11-23 ENCOUNTER — Other Ambulatory Visit: Payer: Self-pay

## 2022-11-23 ENCOUNTER — Ambulatory Visit (INDEPENDENT_AMBULATORY_CARE_PROVIDER_SITE_OTHER): Payer: Medicaid Other

## 2022-11-23 DIAGNOSIS — O3680X0 Pregnancy with inconclusive fetal viability, not applicable or unspecified: Secondary | ICD-10-CM | POA: Diagnosis not present

## 2022-11-23 DIAGNOSIS — O219 Vomiting of pregnancy, unspecified: Secondary | ICD-10-CM

## 2022-11-23 DIAGNOSIS — Z3A01 Less than 8 weeks gestation of pregnancy: Secondary | ICD-10-CM | POA: Diagnosis not present

## 2022-11-23 MED ORDER — PROMETHAZINE HCL 25 MG PO TABS
25.0000 mg | ORAL_TABLET | Freq: Four times a day (QID) | ORAL | 1 refills | Status: DC | PRN
Start: 2022-11-23 — End: 2022-12-29

## 2022-11-23 MED ORDER — DOXYLAMINE-PYRIDOXINE 10-10 MG PO TBEC
2.0000 | DELAYED_RELEASE_TABLET | Freq: Every day | ORAL | 2 refills | Status: DC
Start: 2022-11-23 — End: 2022-12-29

## 2022-12-03 MED ORDER — FAMOTIDINE 20 MG PO TABS
20.0000 mg | ORAL_TABLET | Freq: Two times a day (BID) | ORAL | 2 refills | Status: DC
Start: 2022-12-03 — End: 2022-12-29

## 2022-12-03 NOTE — Addendum Note (Signed)
Addended by: Marjo Bicker on: 12/03/2022 08:17 AM   Modules accepted: Orders

## 2022-12-29 ENCOUNTER — Ambulatory Visit (INDEPENDENT_AMBULATORY_CARE_PROVIDER_SITE_OTHER): Payer: Medicaid Other | Admitting: Family Medicine

## 2022-12-29 ENCOUNTER — Other Ambulatory Visit (HOSPITAL_COMMUNITY)
Admission: RE | Admit: 2022-12-29 | Discharge: 2022-12-29 | Disposition: A | Discharge status: Home / Self Care | Payer: Medicaid Other | Source: Ambulatory Visit | Attending: Family Medicine | Admitting: Family Medicine

## 2022-12-29 ENCOUNTER — Encounter: Payer: Self-pay | Admitting: Family Medicine

## 2022-12-29 VITALS — BP 110/73 | HR 102 | Wt 165.0 lb

## 2022-12-29 DIAGNOSIS — Z3480 Encounter for supervision of other normal pregnancy, unspecified trimester: Secondary | ICD-10-CM | POA: Insufficient documentation

## 2022-12-29 DIAGNOSIS — Z8632 Personal history of gestational diabetes: Secondary | ICD-10-CM

## 2022-12-29 DIAGNOSIS — O0990 Supervision of high risk pregnancy, unspecified, unspecified trimester: Secondary | ICD-10-CM | POA: Insufficient documentation

## 2022-12-29 DIAGNOSIS — Z3481 Encounter for supervision of other normal pregnancy, first trimester: Secondary | ICD-10-CM

## 2022-12-29 DIAGNOSIS — Z1339 Encounter for screening examination for other mental health and behavioral disorders: Secondary | ICD-10-CM | POA: Diagnosis not present

## 2022-12-29 DIAGNOSIS — Z3A12 12 weeks gestation of pregnancy: Secondary | ICD-10-CM | POA: Diagnosis not present

## 2022-12-29 DIAGNOSIS — Z31430 Encounter of female for testing for genetic disease carrier status for procreative management: Secondary | ICD-10-CM | POA: Diagnosis not present

## 2022-12-29 NOTE — Progress Notes (Signed)
NOB LMP: 09/15/22   Last pap:02/12/2021 WNL  Genetic Screening:Desires Does Not want to know gender.   CC: Dizzy spell, feels short of breath, fatigue.  Reports HA's , migraines that will last for days.  Noted elevated B/P at home used Mom's cuff. Pain in hands and swelling.

## 2022-12-29 NOTE — Progress Notes (Signed)
Subjective:   Robin Strickland is a 22 y.o. G3P2002 at [redacted]w[redacted]d by early ultrasound being seen today for her first obstetrical visit.  Her obstetrical history is significant for  h/o GDM A2 with G1 . Patient does intend to breast feed. Pregnancy history fully reviewed.  Patient reports no complaints.  HISTORY: OB History  Gravida Para Term Preterm AB Living  3 2 2  0 0 2  SAB IAB Ectopic Multiple Live Births  0 0 0 0 2    # Outcome Date GA Lbr Len/2nd Weight Sex Type Anes PTL Lv  3 Current           2 Term 01/19/20 [redacted]w[redacted]d 28:59 / 00:58 6 lb 12.1 oz (3.065 kg) F Vag-Spont EPI  LIV     Name: RAHI, CHANDONNET     Apgar1: 9  Apgar5: 9  1 Term 06/17/16 [redacted]w[redacted]d 02:57 / 00:50 7 lb 4 oz (3.289 kg) F Vag-Spont EPI  LIV     Name: ADELLE, ZACHAR     Apgar1: 8  Apgar5: 9   Last pap smear was  02/12/2021 and was normal Past Medical History:  Diagnosis Date   Gestational diabetes    During 1st pregnancy.   Headache    History of gestational diabetes 04/19/2016   2021: early a1c negative.    UTI (urinary tract infection) during pregnancy 07/02/2019   [ ]  needs toc: neg   Past Surgical History:  Procedure Laterality Date   NO PAST SURGERIES     Family History  Problem Relation Age of Onset   Hypertension Mother    Kidney disease Mother    Other Father        unsure of medical history   Hypertension Maternal Grandmother    Kidney disease Maternal Grandmother    Social History   Tobacco Use   Smoking status: Never   Smokeless tobacco: Never  Vaping Use   Vaping status: Never Used  Substance Use Topics   Alcohol use: No   Drug use: No   No Known Allergies Current Outpatient Medications on File Prior to Visit  Medication Sig Dispense Refill   Prenatal Vit-Fe Fumarate-FA (MULTIVITAMIN-PRENATAL) 27-0.8 MG TABS tablet Take 1 tablet by mouth daily at 12 noon.     No current facility-administered medications on file prior to visit.     Exam    Vitals:   12/29/22 1530  BP: 110/73  Pulse: (!) 102  Weight: 165 lb (74.8 kg)   Fetal Heart Rate (bpm): 158  System: General: well-developed, well-nourished female in no acute distress   Skin: normal coloration and turgor, no rashes   Neurologic: oriented, normal, negative, normal mood   Extremities: normal strength, tone, and muscle mass, ROM of all joints is normal   HEENT PERRLA, extraocular movement intact and sclera clear, anicteric   Mouth/Teeth mucous membranes moist, pharynx normal without lesions and dental hygiene good   Neck supple and no masses   Cardiovascular: regular rate and rhythm   Respiratory:  no respiratory distress, normal breath sounds   Abdomen: soft, non-tender; bowel sounds normal; no masses,  no organomegaly   Limited Bedside u/s shows + FHR and movement. Assessment:   Pregnancy: [id number] Patient Active Problem List   Diagnosis Date Noted   Supervision of other normal pregnancy, antepartum 12/29/2022   Migraine without aura and without status migrainosus, not intractable 10/28/2017     Plan:  1. Supervision of other normal pregnancy, antepartum New OB  labs - CBC/D/Plt+RPR+Rh+ABO+RubIgG... - Culture, OB Urine - [facility] Basic Panel - PANORAMA PRENATAL TEST - [place] MFM OB COMP + 14 WK; Future - Urine cytology ancillary only  2. History of gestational diabetes - Hemoglobin A1c   Initial labs drawn. Continue prenatal vitamins. Genetic Screening discussed, NIPS: ordered. Ultrasound discussed; fetal anatomic survey: ordered. Problem list reviewed and updated. The nature of [facility] with multiple MDs and other Advanced Practice Providers was explained to patient; also emphasized that residents, students are part of our team. Routine obstetric precautions reviewed. Return in 4 weeks (on 01/26/2023).

## 2022-12-30 ENCOUNTER — Telehealth: Payer: Medicaid Other

## 2022-12-30 LAB — CBC/D/PLT+RPR+RH+ABO+RUBIGG...
Basophils Absolute: 0 10*3/uL (ref 0.0–0.2)
Basos: 0 %
EOS (ABSOLUTE): 0.2 10*3/uL (ref 0.0–0.4)
HCV Ab: NONREACTIVE
HIV Screen 4th Generation wRfx: NONREACTIVE
Hematocrit: 38.1 % (ref 34.0–46.6)
Hemoglobin: 12.7 g/dL (ref 11.1–15.9)
Hepatitis B Surface Ag: NEGATIVE
Immature Grans (Abs): 0.1 10*3/uL (ref 0.0–0.1)
Immature Granulocytes: 1 %
Lymphocytes Absolute: 3.3 10*3/uL — ABNORMAL HIGH (ref 0.7–3.1)
Lymphs: 28 %
MCH: 30 pg (ref 26.6–33.0)
MCV: 90 fL (ref 79–97)
Monocytes Absolute: 0.9 10*3/uL (ref 0.1–0.9)
Monocytes: 8 %
Neutrophils Absolute: 7.3 10*3/uL — ABNORMAL HIGH (ref 1.4–7.0)
RBC: 4.23 x10E6/uL (ref 3.77–5.28)
RDW: 12.9 % (ref 11.7–15.4)
RPR Ser Ql: NONREACTIVE
Rubella Antibodies, IGG: 1.3 index (ref >0.99)
WBC: 11.8 10*3/uL — ABNORMAL HIGH (ref 3.4–10.8)

## 2022-12-30 LAB — HCV INTERPRETATION

## 2022-12-30 LAB — HEMOGLOBIN A1C: Est. average glucose Bld gHb Est-mCnc: 111 mg/dL

## 2023-01-06 ENCOUNTER — Encounter: Payer: Self-pay | Admitting: Family Medicine

## 2023-01-24 ENCOUNTER — Other Ambulatory Visit: Payer: Self-pay | Admitting: Certified Nurse Midwife

## 2023-01-26 ENCOUNTER — Ambulatory Visit (INDEPENDENT_AMBULATORY_CARE_PROVIDER_SITE_OTHER): Payer: Medicaid Other | Admitting: Family Medicine

## 2023-01-26 ENCOUNTER — Other Ambulatory Visit (HOSPITAL_COMMUNITY)
Admission: RE | Admit: 2023-01-26 | Discharge: 2023-01-26 | Disposition: A | Discharge status: Home / Self Care | Payer: Medicaid Other | Source: Ambulatory Visit | Attending: Family Medicine | Admitting: Family Medicine

## 2023-01-26 VITALS — BP 103/69 | HR 102 | Wt 165.0 lb

## 2023-01-26 DIAGNOSIS — Z3480 Encounter for supervision of other normal pregnancy, unspecified trimester: Secondary | ICD-10-CM | POA: Diagnosis not present

## 2023-01-26 DIAGNOSIS — O4692 Antepartum hemorrhage, unspecified, second trimester: Secondary | ICD-10-CM

## 2023-01-26 DIAGNOSIS — G43009 Migraine without aura, not intractable, without status migrainosus: Secondary | ICD-10-CM

## 2023-01-26 MED ORDER — CYCLOBENZAPRINE HCL 10 MG PO TABS: 10.0000 mg | ORAL_TABLET | Freq: Three times a day (TID) | ORAL | 1 refills | Status: DC | PRN

## 2023-01-26 NOTE — Progress Notes (Signed)
   PRENATAL VISIT NOTE  Subjective:  Robin Strickland is a 22 y.o. G3P2002 at [redacted]w[redacted]d being seen today for ongoing prenatal care.  She is currently monitored for the following issues for this low-risk pregnancy and has Migraine without aura and without status migrainosus, not intractable and Supervision of other normal pregnancy, antepartum on their problem list.  Patient reports bleeding, headache, and cramping .  Contractions: Irritability. Vag. Bleeding: Scant.  Movement: Present. Denies leaking of fluid.   The following portions of the patient's history were reviewed and updated as appropriate: allergies, current medications, past family history, past medical history, past social history, past surgical history and problem list.   Objective:   Vitals:   01/26/23 1440  BP: 103/69  Pulse: (!) 102  Weight: 165 lb (74.8 kg)    Fetal Status:     Movement: Present     General:  Alert, oriented and cooperative. Patient is in no acute distress.  Skin: Skin is warm and dry. No rash noted.   Cardiovascular: Normal heart rate noted  Respiratory: Normal respiratory effort, no problems with respiration noted  Abdomen: Soft, gravid, appropriate for gestational age.  Pain/Pressure: Absent     Pelvic: Cervical exam performed in the presence of a chaperone 1.5 cm external os, internal os is closed        Extremities: Normal range of motion.     Mental Status: Normal mood and affect. Normal behavior. Normal judgment and thought content.   Assessment and Plan:  Pregnancy: G3P2002 at [redacted]w[redacted]d 1. Supervision of other normal pregnancy, antepartum AFP today - AFP, Serum, Open Spina Bifida  2. Vaginal bleeding in pregnancy, second trimester U/A negative No blood in vault Will check cervical length - [place] MFM OB TRANSVAGINAL; Future - Cervicovaginal ancillary only  3. Migraine without aura and without status migrainosus, not intractable Trial of Flexeril, may take 1/2 tab. -  cyclobenzaprine (FLEXERIL) 10 MG tablet; Take 1 tablet (10 mg total) by mouth every 8 (eight) hours as needed for muscle spasms.  Dispense: 30 tablet; Refill: 1  Preterm labor symptoms and general obstetric precautions including but not limited to vaginal bleeding, contractions, leaking of fluid and fetal movement were reviewed in detail with the patient. Please refer to After Visit Summary for other counseling recommendations.   Return in 4 weeks (on 02/23/2023).  Future Appointments  Date Time Provider Department Center  01/31/2023  9:30 AM [facility] NURSE [facility]  01/31/2023  9:45 AM [facility] US5 [facility]  02/15/2023 12:15 PM [facility] NURSE [facility]  02/15/2023 12:30 PM [facility] US1 [facility]  02/23/2023  2:30 PM Reva Bores, MD [facility]  03/23/2023  8:55 AM [place] Bing, MD [facility]    Reva Bores, MD

## 2023-01-26 NOTE — Progress Notes (Signed)
Suffering from migraines   Spotting and cramping

## 2023-01-28 ENCOUNTER — Other Ambulatory Visit: Payer: Self-pay | Admitting: Family Medicine

## 2023-01-28 DIAGNOSIS — O4690 Antepartum hemorrhage, unspecified, unspecified trimester: Secondary | ICD-10-CM | POA: Insufficient documentation

## 2023-01-28 DIAGNOSIS — O09299 Supervision of pregnancy with other poor reproductive or obstetric history, unspecified trimester: Secondary | ICD-10-CM | POA: Insufficient documentation

## 2023-01-28 DIAGNOSIS — O4692 Antepartum hemorrhage, unspecified, second trimester: Secondary | ICD-10-CM

## 2023-01-28 LAB — CERVICOVAGINAL ANCILLARY ONLY
Bacterial Vaginitis (gardnerella): NEGATIVE
Candida Glabrata: NEGATIVE
Candida Vaginitis: POSITIVE — AB
Chlamydia: NEGATIVE
Comment: NEGATIVE
Comment: NEGATIVE
Comment: NEGATIVE
Comment: NEGATIVE
Comment: NEGATIVE
Comment: NORMAL
Neisseria Gonorrhea: NEGATIVE
Trichomonas: NEGATIVE

## 2023-01-28 MED ORDER — TERCONAZOLE 0.8 % VA CREA: 1.0000 | TOPICAL_CREAM | Freq: Every day | VAGINAL | 0 refills | Status: DC

## 2023-01-28 NOTE — Addendum Note (Signed)
Addended by: Reva Bores on: 01/28/2023 04:09 PM   Modules accepted: Orders

## 2023-01-31 ENCOUNTER — Ambulatory Visit: Payer: Medicaid Other | Admitting: *Deleted

## 2023-01-31 ENCOUNTER — Encounter: Payer: Self-pay | Admitting: *Deleted

## 2023-01-31 ENCOUNTER — Ambulatory Visit: Payer: Medicaid Other | Attending: Family Medicine

## 2023-01-31 VITALS — BP 108/55 | HR 86

## 2023-01-31 DIAGNOSIS — O09299 Supervision of pregnancy with other poor reproductive or obstetric history, unspecified trimester: Secondary | ICD-10-CM | POA: Diagnosis present

## 2023-01-31 DIAGNOSIS — O4690 Antepartum hemorrhage, unspecified, unspecified trimester: Secondary | ICD-10-CM | POA: Insufficient documentation

## 2023-01-31 DIAGNOSIS — Z8632 Personal history of gestational diabetes: Secondary | ICD-10-CM | POA: Diagnosis present

## 2023-01-31 DIAGNOSIS — Z3A17 17 weeks gestation of pregnancy: Secondary | ICD-10-CM

## 2023-01-31 DIAGNOSIS — O4692 Antepartum hemorrhage, unspecified, second trimester: Secondary | ICD-10-CM

## 2023-01-31 LAB — AFP, SERUM, OPEN SPINA BIFIDA
AFP MoM: 1.01
AFP Value: 26.2 ng/mL
Gest. Age on Collection Date: 16.2 wk
Maternal Age At EDD: 23.3 a
OSBR Risk 1 IN: 3605
Test Results:: NEGATIVE
Weight: 165 [lb_av]

## 2023-02-10 ENCOUNTER — Inpatient Hospital Stay (HOSPITAL_COMMUNITY)
Admission: AD | Admit: 2023-02-10 | Discharge: 2023-02-10 | Disposition: A | Discharge status: Home / Self Care | Payer: Medicaid Other | Attending: Obstetrics & Gynecology | Admitting: Obstetrics & Gynecology

## 2023-02-10 ENCOUNTER — Inpatient Hospital Stay (HOSPITAL_COMMUNITY): Payer: Medicaid Other

## 2023-02-10 ENCOUNTER — Encounter (HOSPITAL_COMMUNITY): Payer: Self-pay | Admitting: Obstetrics & Gynecology

## 2023-02-10 DIAGNOSIS — O99012 Anemia complicating pregnancy, second trimester: Secondary | ICD-10-CM

## 2023-02-10 DIAGNOSIS — O26892 Other specified pregnancy related conditions, second trimester: Secondary | ICD-10-CM | POA: Insufficient documentation

## 2023-02-10 DIAGNOSIS — O209 Hemorrhage in early pregnancy, unspecified: Secondary | ICD-10-CM | POA: Diagnosis not present

## 2023-02-10 DIAGNOSIS — R002 Palpitations: Secondary | ICD-10-CM | POA: Insufficient documentation

## 2023-02-10 DIAGNOSIS — D508 Other iron deficiency anemias: Secondary | ICD-10-CM | POA: Diagnosis not present

## 2023-02-10 DIAGNOSIS — Z3A18 18 weeks gestation of pregnancy: Secondary | ICD-10-CM

## 2023-02-10 DIAGNOSIS — R42 Dizziness and giddiness: Secondary | ICD-10-CM | POA: Diagnosis not present

## 2023-02-10 LAB — URINALYSIS, ROUTINE W REFLEX MICROSCOPIC
Bilirubin Urine: NEGATIVE
Glucose, UA: NEGATIVE mg/dL
Hgb urine dipstick: NEGATIVE
Ketones, ur: NEGATIVE mg/dL
Nitrite: NEGATIVE
Protein, ur: NEGATIVE mg/dL
Specific Gravity, Urine: 1.024 (ref 1.005–1.030)
WBC, UA: >50 WBC/hpf (ref 0–5)
pH: 6 (ref 5.0–8.0)

## 2023-02-10 LAB — COMPREHENSIVE METABOLIC PANEL
ALT: 17 U/L (ref 0–44)
AST: 17 U/L (ref 15–41)
Albumin: 2.8 g/dL — ABNORMAL LOW (ref 3.5–5.0)
Alkaline Phosphatase: 63 U/L (ref 38–126)
Anion gap: 9 (ref 5–15)
BUN: 5 mg/dL — ABNORMAL LOW (ref 6–20)
CO2: 21 mmol/L — ABNORMAL LOW (ref 22–32)
Calcium: 8.4 mg/dL — ABNORMAL LOW (ref 8.9–10.3)
Chloride: 103 mmol/L (ref 98–111)
Creatinine, Ser: 0.49 mg/dL (ref 0.44–1.00)
GFR, Estimated: >60 mL/min (ref >60)
Glucose, Bld: 103 mg/dL — ABNORMAL HIGH (ref 70–99)
Potassium: 3.6 mmol/L (ref 3.5–5.1)
Sodium: 133 mmol/L — ABNORMAL LOW (ref 135–145)
Total Bilirubin: 0.6 mg/dL (ref 0.3–1.2)
Total Protein: 6.7 g/dL (ref 6.5–8.1)

## 2023-02-10 LAB — FERRITIN: Ferritin: 10 ng/mL — ABNORMAL LOW (ref 11–307)

## 2023-02-10 LAB — CBC
HCT: 32.5 % — ABNORMAL LOW (ref 36.0–46.0)
Hemoglobin: 10.7 g/dL — ABNORMAL LOW (ref 12.0–15.0)
MCH: 29.6 pg (ref 26.0–34.0)
MCHC: 32.9 g/dL (ref 30.0–36.0)
MCV: 89.8 fL (ref 80.0–100.0)
Platelets: 340 10*3/uL (ref 150–400)
RBC: 3.62 MIL/uL — ABNORMAL LOW (ref 3.87–5.11)
RDW: 13.3 % (ref 11.5–15.5)
WBC: 11 10*3/uL — ABNORMAL HIGH (ref 4.0–10.5)
nRBC: 0 % (ref 0.0–0.2)

## 2023-02-10 LAB — MAGNESIUM: Magnesium: 1.9 mg/dL (ref 1.7–2.4)

## 2023-02-10 MED ORDER — LACTATED RINGERS IV BOLUS
1000.0000 mL | Freq: Once | INTRAVENOUS | Status: AC
  Administered 2023-02-10: 1000 mL via INTRAVENOUS

## 2023-02-10 MED ORDER — FERROUS SULFATE 325 (65 FE) MG PO TBEC: 325.0000 mg | DELAYED_RELEASE_TABLET | ORAL | 1 refills | Status: DC

## 2023-02-10 NOTE — MAU Provider Note (Signed)
Chief Complaint: Dizziness   Event Date/Time   First Provider Initiated Contact with Patient 02/10/23 1246      SUBJECTIVE HPI: Gabrella Freking is a 22 y.o. G3P2002 at [redacted]w[redacted]d by early ultrasound who presents to maternity admissions reporting SOB, dizziness.  Patient notes over past few days she has felt dizzy and short of breath. Feels dizzy/lightheaded when she moves from sitting to standing consistently, but sometimes happens at rest now. Denies feeling that the room is spinning, hearing changes, tinnitus, recent infections/sick symptoms. Has not fallen or gotten injured.  SOB also started a few days ago. Has a heaviness in her chest as well. Notes times where her heart will beat very quickly. Denies hx of asthma, cardiac disease, cough, bleeding.  HPI  Past Medical History:  Diagnosis Date   Gestational diabetes    During 1st pregnancy.   Headache    History of gestational diabetes 04/19/2016   2021: early a1c negative.    UTI (urinary tract infection) during pregnancy 07/02/2019   [ ]  needs toc: neg   Past Surgical History:  Procedure Laterality Date   NO PAST SURGERIES     Social History   Socioeconomic History   Marital status: Married    Spouse name: Not on file   Number of children: 2   Years of education: 12   Highest education level: High school graduate  Occupational History   Occupation: pharmacy tech  Tobacco Use   Smoking status: Never   Smokeless tobacco: Never  Vaping Use   Vaping status: Never Used  Substance and Sexual Activity   Alcohol use: No   Drug use: No   Sexual activity: Yes  Other Topics Concern   Not on file  Social History Narrative   Lives at home with family.   Right-handed.   No daily use of caffeine, occasional iced coffee or soda.   Social Determinants of Health   Financial Resource Strain: Not on file  Food Insecurity: Not on file  Transportation Needs: Not on file  Physical Activity: Not on file  Stress: Not  on file  Social Connections: Not on file  Intimate Partner Violence: Not on file   No current facility-administered medications on file prior to encounter.   Current Outpatient Medications on File Prior to Encounter  Medication Sig Dispense Refill   Prenatal Vit-Fe Fumarate-FA (MULTIVITAMIN-PRENATAL) 27-0.8 MG TABS tablet Take 1 tablet by mouth daily at 12 noon.     cyclobenzaprine (FLEXERIL) 10 MG tablet Take 1 tablet (10 mg total) by mouth every 8 (eight) hours as needed for muscle spasms. (Patient not taking: Reported on 01/31/2023) 30 tablet 1   terconazole (TERAZOL 3) 0.8 % vaginal cream Place 1 applicator vaginally at bedtime. (Patient not taking: Reported on 01/31/2023) 20 g 0   No Known Allergies  ROS:  Pertinent positives/negatives listed above.  I have reviewed patient's Past Medical Hx, Surgical Hx, Family Hx, Social Hx, medications and allergies.   Physical Exam  Patient Vitals for the past 24 hrs:  BP Temp Temp src Pulse Resp SpO2 Height Weight  02/10/23 1545 (!) 106/50 -- -- 83 -- -- -- --  02/10/23 1245 -- -- -- -- -- 99 % -- --  02/10/23 1219 (!) 110/58 98.3 F (36.8 C) Oral 99 14 100 % 5\' 1"  (1.549 m) 76.3 kg  02/10/23 1212 -- -- -- -- -- 100 % -- --   Constitutional: Well-developed, well-nourished female in no acute distress.  Cardiovascular: normal rate,  regular rhythm. No m/r/g Respiratory: CTAB GI: Abd soft, non-tender. Pos BS x 4 MS: Extremities nontender, no edema, normal ROM Neurologic: Alert and oriented x 4.   LAB RESULTS Results for orders placed or performed during the hospital encounter of 02/10/23 (from the past 24 hour(s))  CBC     Status: Abnormal   Collection Time: 02/10/23  1:28 PM  Result Value Ref Range   WBC 11.0 (H) 4.0 - 10.5 K/uL   RBC 3.62 (L) 3.87 - 5.11 MIL/uL   Hemoglobin 10.7 (L) 12.0 - 15.0 g/dL   HCT 87.5 (L) 64.3 - 32.9 %   MCV 89.8 80.0 - 100.0 fL   MCH 29.6 26.0 - 34.0 pg   MCHC 32.9 30.0 - 36.0 g/dL   RDW 51.8 84.1 - 66.0  %   Platelets 340 150 - 400 K/uL   nRBC 0.0 0.0 - 0.2 %  Comprehensive metabolic panel     Status: Abnormal   Collection Time: 02/10/23  1:28 PM  Result Value Ref Range   Sodium 133 (L) 135 - 145 mmol/L   Potassium 3.6 3.5 - 5.1 mmol/L   Chloride 103 98 - 111 mmol/L   CO2 21 (L) 22 - 32 mmol/L   Glucose, Bld 103 (H) 70 - 99 mg/dL   BUN 5 (L) 6 - 20 mg/dL   Creatinine, Ser 6.30 0.44 - 1.00 mg/dL   Calcium 8.4 (L) 8.9 - 10.3 mg/dL   Total Protein 6.7 6.5 - 8.1 g/dL   Albumin 2.8 (L) 3.5 - 5.0 g/dL   AST 17 15 - 41 U/L   ALT 17 0 - 44 U/L   Alkaline Phosphatase 63 38 - 126 U/L   Total Bilirubin 0.6 0.3 - 1.2 mg/dL   GFR, Estimated >16 >01 mL/min   Anion gap 9 5 - 15  Ferritin     Status: Abnormal   Collection Time: 02/10/23  1:28 PM  Result Value Ref Range   Ferritin 10 (L) 11 - 307 ng/mL  Magnesium     Status: None   Collection Time: 02/10/23  1:28 PM  Result Value Ref Range   Magnesium 1.9 1.7 - 2.4 mg/dL  Urinalysis, Routine w reflex microscopic -Urine, Clean Catch     Status: Abnormal   Collection Time: 02/10/23  2:44 PM  Result Value Ref Range   Color, Urine YELLOW YELLOW   APPearance HAZY (A) CLEAR   Specific Gravity, Urine 1.024 1.005 - 1.030   pH 6.0 5.0 - 8.0   Glucose, UA NEGATIVE NEGATIVE mg/dL   Hgb urine dipstick NEGATIVE NEGATIVE   Bilirubin Urine NEGATIVE NEGATIVE   Ketones, ur NEGATIVE NEGATIVE mg/dL   Protein, ur NEGATIVE NEGATIVE mg/dL   Nitrite NEGATIVE NEGATIVE   Leukocytes,Ua LARGE (A) NEGATIVE   RBC / HPF 6-10 0 - 5 RBC/hpf   WBC, UA >50 0 - 5 WBC/hpf   Bacteria, UA RARE (A) NONE SEEN   Squamous Epithelial / HPF 6-10 0 - 5 /HPF   Mucus PRESENT     O/Positive/-- (07/24 1556)  EKG: regular rhythm. 91 BPM. Normal axis, intervals. Overall normal EKG  IMAGING DG CHEST PORT 1 VIEW  Result Date: 02/10/2023 CLINICAL DATA:  Dizziness EXAM: PORTABLE CHEST 1 VIEW COMPARISON:  None Available. FINDINGS: The cardiomediastinal silhouette is normal There is  no focal consolidation or pulmonary edema. There is no pleural effusion or pneumothorax There is no acute osseous abnormality. IMPRESSION: No radiographic evidence of acute cardiopulmonary process. Electronically Signed  By: Lesia Hausen M.D.   On: 02/10/2023 15:04   [place] MFM OB TRANSVAGINAL  Result Date: 01/31/2023 ----------------------------------------------------------------------  OBSTETRICS REPORT                       (Signed Final 01/31/2023 01:45 pm) ---------------------------------------------------------------------- Patient Info  ID #:       [id number]                          D.O.B.:  14-Nov-2000 (21 yrs)  Name:       [name]                  Visit Date: 01/31/2023 11:34 am              Dellis Filbert ---------------------------------------------------------------------- Performed By  Attending:        Ma Rings MD         Ref. Address:     [address]                                                             [address]  Performed By:     Alain Marion     [facility]  Referred By:      [facility] ---------------------------------------------------------------------- Orders  #  Description                           Code        Ordered By  1  [place] MFM OB TRANSVAGINAL                [phone]     Tinnie Gens  2  [place] MFM OB LIMITED                     [id number]    Tinnie Gens ----------------------------------------------------------------------  #  Order #                     Accession #                Episode #  1  [id number]                   [id number]                 [id number]  2  [id number]                   [id number]                 [id number] ---------------------------------------------------------------------- Indications  Vaginal bleeding in  pregnancy, second          O46.92  trimester  [redacted] weeks gestation of pregnancy                Z3A.17 ---------------------------------------------------------------------- Fetal Evaluation  Num Of Fetuses:         1  Fetal Heart Rate(bpm):  148  Cardiac Activity:       Observed  Presentation:           Cephalic  Placenta:               Posterior  Amniotic Fluid  AFI FV:      Within normal limits                              Largest Pocket(cm)                              2.13 ---------------------------------------------------------------------- OB History  Blood Type:   O+  Maternal Racial/Ethnic Group:   Hispanic  Gravidity:    3         Term:   2        Prem:   0        SAB:   0  TOP:          0       Ectopic:  0        Living: 2 ---------------------------------------------------------------------- Gestational Age  LMP:           19w 5d        Date:  09/15/22                  EDD:   06/22/23  Best:          Denyse Dago 0d     Det. ByMarcella Dubs         EDD:   07/11/23                                      (11/23/22) ---------------------------------------------------------------------- Cervix Uterus Adnexa  Cervix  Length:            5.6  cm.  Normal appearance by transabdominal scan Normal appearance by  transvaginal scan  Uterus  No abnormality visualized.  Right Ovary  Not visualized.  Left Ovary  Size(cm)     3.33   x   2.39   x  1.88      Vol(ml): 7.83 ---------------------------------------------------------------------- Comments  This patient was seen for an ultrasound exam due to vaginal  bleeding earlier in her pregnancy.  She reports that her  vaginal bleeding has resolved over the past week.  She denies any other problems in her current pregnancy.  A limited ultrasound performed today shows a viable single  intrauterine gestation with normal amniotic fluid.  A normal-appearing posterior placenta is noted.  On a transvaginal ultrasound performed today, her cervical  length measured 5.6 cm long without any  signs of funneling.  There were no signs of placenta previa noted.  The patient was reassured by today's findings.  She will return in 2 weeks for a detailed fetal anatomy scan. ----------------------------------------------------------------------                   Ma Rings, MD Electronically Signed Final Report   01/31/2023 01:45 pm ----------------------------------------------------------------------   [place] MFM OB LIMITED  Result Date: 01/31/2023 ----------------------------------------------------------------------  OBSTETRICS REPORT                       (Signed Final 01/31/2023 01:45 pm) ---------------------------------------------------------------------- Patient Info  ID #:       [id number]                          D.O.B.:  01-Nov-2000 (21 yrs)  Name:       [name]                  Visit Date: 01/31/2023 11:34 am              Dellis Filbert ---------------------------------------------------------------------- Performed By  Attending:        Ma Rings MD         Ref. Address:     [address]                                                             [address]  Performed By:     Alain Marion     [facility]  Referred By:      [facility] ---------------------------------------------------------------------- Orders  #  Description                           Code        Ordered By  1  [place] MFM OB TRANSVAGINAL                [phone]  Tinnie Gens  2  [place] MFM OB LIMITED                     [id number]    TANYA PRATT ----------------------------------------------------------------------  #  Order #                     Accession #                Episode #  1  [id number]                   [id number]                 [id number]  2  [id number]                    [id number]                 [id number] ---------------------------------------------------------------------- Indications  Vaginal bleeding in pregnancy, second          O46.92  trimester  [redacted] weeks gestation of pregnancy                Z3A.17 ---------------------------------------------------------------------- Fetal Evaluation  Num Of Fetuses:         1  Fetal Heart Rate(bpm):  148  Cardiac Activity:       Observed  Presentation:           Cephalic  Placenta:               Posterior  Amniotic Fluid  AFI FV:      Within normal limits                              Largest Pocket(cm)                              2.13 ---------------------------------------------------------------------- OB History  Blood Type:   O+  Maternal Racial/Ethnic Group:   Hispanic  Gravidity:    3         Term:   2        Prem:   0        SAB:   0  TOP:          0       Ectopic:  0        Living: 2 ---------------------------------------------------------------------- Gestational Age  LMP:           19w 5d        Date:  09/15/22                  EDD:   06/22/23  Best:          Denyse Dago 0d     Det. ByMarcella Dubs         EDD:   07/11/23                                      (11/23/22) ---------------------------------------------------------------------- Cervix Uterus Adnexa  Cervix  Length:            5.6  cm.  Normal appearance by transabdominal scan Normal appearance by  transvaginal scan  Uterus  No abnormality visualized.  Right Ovary  Not visualized.  Left Ovary  Size(cm)  3.33   x   2.39   x  1.88      Vol(ml): 7.83 ---------------------------------------------------------------------- Comments  This patient was seen for an ultrasound exam due to vaginal  bleeding earlier in her pregnancy.  She reports that her  vaginal bleeding has resolved over the past week.  She denies any other problems in her current pregnancy.  A limited ultrasound performed today shows a viable single  intrauterine gestation with normal amniotic fluid.  A  normal-appearing posterior placenta is noted.  On a transvaginal ultrasound performed today, her cervical  length measured 5.6 cm long without any signs of funneling.  There were no signs of placenta previa noted.  The patient was reassured by today's findings.  She will return in 2 weeks for a detailed fetal anatomy scan. ----------------------------------------------------------------------                   Ma Rings, MD Electronically Signed Final Report   01/31/2023 01:45 pm ----------------------------------------------------------------------    MAU Management/MDM: Orders Placed This Encounter  Procedures   DG CHEST PORT 1 VIEW   Urinalysis, Routine w reflex microscopic -Urine, Clean Catch   CBC   Comprehensive metabolic panel   Ferritin   Magnesium   Ambulatory referral to Cardiology   Orthostatic vital signs   EKG 12-Lead   Discharge patient    Meds ordered this encounter  Medications   lactated ringers bolus 1,000 mL   ferrous sulfate 325 (65 FE) MG EC tablet    Sig: Take 1 tablet (325 mg total) by mouth every other day.    Dispense:  45 tablet    Refill:  1    Concern that patient may have anemia, orthostasis causing her symptoms. Reassuringly patient is comfortable on exam with 100% O2 sat and no abnormal cardiac/pulmonary exam findings. Do not suspect ACS, PE, asthma, arrhythmia, infectious etiology, bleeding causing her symptoms. Work-up included CBC, ferritin, CMP, magnesium, urine, EKG, CXR.  Work-up remarkable for anemia with Hgb 10.7, ferritin 10, mildly dehydrated urine. EKG, CXR wnl. Urine was a dirty catch. Orthostatics showed a ~20 BPM increase in HR from supine to standing. Patient felt improvement in her symptoms after lunch and 1 L LR bolus.  Will proceed with treatment for IDA with PO iron. To follow-up with primary OB provider to assess for need for iron infusions. Additionally have made a referral to [facility] cardiology for further assessment if treating anemia does  not improve symptoms. Encouraged to eat/drink regularly and stay hydrated.  FHT 142. +FM.  ASSESSMENT 1. Anemia affecting pregnancy in second trimester   2. Iron deficiency anemia secondary to inadequate dietary iron intake   3. Palpitations   4. Orthostatic dizziness   5. [redacted] weeks gestation of pregnancy     PLAN Discharge home with strict return precautions. Allergies as of 02/10/2023   No Known Allergies      Medication List     STOP taking these medications    cyclobenzaprine 10 MG tablet Commonly known as: FLEXERIL   terconazole 0.8 % vaginal cream Commonly known as: TERAZOL 3       TAKE these medications    ferrous sulfate 325 (65 FE) MG EC tablet Take 1 tablet (325 mg total) by mouth every other day.   multivitamin-prenatal 27-0.8 MG Tabs tablet Take 1 tablet by mouth daily at 12 noon.         Wylene Simmer, MD OB Fellow 02/10/2023  4:09 PM

## 2023-02-10 NOTE — MAU Note (Signed)
Robin Strickland is a 22 y.o. at [redacted]w[redacted]d here in MAU reporting: like 3 days ago, was feeling really dizzy.  Yesterday felt like she couldn't get up or she would fall.  Started feeling chest pain (tightness in chest), feels like she can't catch her breath or is short of breath.  States feels super dizzy right now.  Denies cough, fever or sore throat.    Onset of complaint: yesterday Pain score: 7 Vitals:   02/10/23 1212 02/10/23 1219  BP:  (!) 110/58  Pulse:  99  Resp:  14  Temp:  98.3 F (36.8 C)  SpO2: 100% 100%     FHT:142 Lab orders placed from triage:  ua

## 2023-02-11 ENCOUNTER — Encounter: Payer: Self-pay | Admitting: *Deleted

## 2023-02-15 ENCOUNTER — Ambulatory Visit: Payer: Medicaid Other | Attending: Family Medicine

## 2023-02-15 ENCOUNTER — Ambulatory Visit: Payer: Medicaid Other | Admitting: *Deleted

## 2023-02-15 ENCOUNTER — Other Ambulatory Visit: Payer: Self-pay | Admitting: Family Medicine

## 2023-02-15 VITALS — BP 102/56 | HR 85

## 2023-02-15 DIAGNOSIS — Z8632 Personal history of gestational diabetes: Secondary | ICD-10-CM | POA: Insufficient documentation

## 2023-02-15 DIAGNOSIS — Z3480 Encounter for supervision of other normal pregnancy, unspecified trimester: Secondary | ICD-10-CM

## 2023-02-15 DIAGNOSIS — Z363 Encounter for antenatal screening for malformations: Secondary | ICD-10-CM | POA: Insufficient documentation

## 2023-02-15 DIAGNOSIS — O09292 Supervision of pregnancy with other poor reproductive or obstetric history, second trimester: Secondary | ICD-10-CM | POA: Diagnosis not present

## 2023-02-15 DIAGNOSIS — O99212 Obesity complicating pregnancy, second trimester: Secondary | ICD-10-CM | POA: Diagnosis not present

## 2023-02-15 DIAGNOSIS — Z3A19 19 weeks gestation of pregnancy: Secondary | ICD-10-CM | POA: Diagnosis not present

## 2023-02-15 DIAGNOSIS — E669 Obesity, unspecified: Secondary | ICD-10-CM

## 2023-02-23 ENCOUNTER — Encounter: Payer: Self-pay | Admitting: Family Medicine

## 2023-02-23 ENCOUNTER — Encounter: Payer: Medicaid Other | Admitting: Family Medicine

## 2023-02-23 NOTE — Progress Notes (Signed)
Patient did not keep appointment today. She will be called to reschedule.  

## 2023-03-03 ENCOUNTER — Ambulatory Visit (INDEPENDENT_AMBULATORY_CARE_PROVIDER_SITE_OTHER): Payer: Medicaid Other | Admitting: Obstetrics & Gynecology

## 2023-03-03 ENCOUNTER — Encounter: Payer: Self-pay | Admitting: Obstetrics & Gynecology

## 2023-03-03 VITALS — BP 101/67 | HR 88 | Wt 172.0 lb

## 2023-03-03 DIAGNOSIS — Z3A21 21 weeks gestation of pregnancy: Secondary | ICD-10-CM

## 2023-03-03 DIAGNOSIS — Z3480 Encounter for supervision of other normal pregnancy, unspecified trimester: Secondary | ICD-10-CM

## 2023-03-03 NOTE — Progress Notes (Signed)
PRENATAL VISIT NOTE  Subjective:  Robin Strickland is a 22 y.o. G3P2002 at [redacted]w[redacted]d being seen today for ongoing prenatal care.  She is currently monitored for the following issues for this low-risk pregnancy and has Supervision of other normal pregnancy, antepartum and History of gestational diabetes mellitus (GDM) in prior pregnancy, currently pregnant on their problem list.  Patient reports no complaints.  Contractions: Not present. Vag. Bleeding: None.  Movement: Present. Denies leaking of fluid.   The following portions of the patient's history were reviewed and updated as appropriate: allergies, current medications, past family history, past medical history, past social history, past surgical history and problem list.   Objective:   Vitals:   03/03/23 0906  BP: 101/67  Pulse: 88  Weight: 172 lb (78 kg)    Fetal Status: Fetal Heart Rate (bpm): 143   Movement: Present     General:  Alert, oriented and cooperative. Patient is in no acute distress.  Skin: Skin is warm and dry. No rash noted.   Cardiovascular: Normal heart rate noted  Respiratory: Normal respiratory effort, no problems with respiration noted  Abdomen: Soft, gravid, appropriate for gestational age.  Pain/Pressure: Present     Pelvic: Cervical exam deferred        Extremities: Normal range of motion.     Mental Status: Normal mood and affect. Normal behavior. Normal judgment and thought content.   [place] MFM OB DETAIL +14 WK  Result Date: 02/15/2023 ----------------------------------------------------------------------  OBSTETRICS REPORT                       (Signed Final 02/15/2023 01:34 pm) ---------------------------------------------------------------------- Patient Info  ID #:       [id number]                          D.O.B.:  Oct 17, 2000 (21 yrs)  Name:       Robin Strickland                  Visit Date: 02/15/2023 12:08 pm              GALVEZ GRANADOS  ---------------------------------------------------------------------- Performed By  Attending:        Braxton Feathers DO       Ref. Address:     [address]                                                             [address]  Performed By:     Marcellina Millin       [facility]  Referred By:      [facility] ---------------------------------------------------------------------- Orders  #  Description                           Code        Ordered By  1  [place] MFM OB DETAIL +14 WK               76811.01    Tinnie Gens ----------------------------------------------------------------------  #  Order #                     Accession #                Episode #  1  [id number]                   [id number]                 [id number] ---------------------------------------------------------------------- Indications  Obesity complicating pregnancy, second         O99.212  trimester (pre-G BMI 31)  Poor obstetric history: Previous gestational   O09.299  diabetes (early A1c 5.5)  Encounter for antenatal screening for          Z36.3  malformations  [redacted] weeks gestation of pregnancy                Z3A.19  LR NIPS - Female, Negative Horizon, Negative  AFP ---------------------------------------------------------------------- Vital Signs  BP:          102/56 ---------------------------------------------------------------------- Fetal Evaluation  Num Of Fetuses:         1  Cardiac Activity:       Observed  Presentation:           Cephalic  Placenta:               Posterior  P. Cord Insertion:      Visualized, central  Amniotic Fluid  AFI FV:      Within normal limits                              Largest Pocket(cm)                              6.05  ---------------------------------------------------------------------- Biometry  BPD:      44.9  mm     G. Age:  19w 4d         69  %    CI:        75.31   %    70 - 86                                                          FL/HC:      17.6   %    16.1 - 18.3  HC:      164.1  mm     G. Age:  19w 1d         43  %    HC/AC:      1.21        1.09 - 1.39  AC:      135.5  mm     G. Age:  19w 0d         41  %    FL/BPD:     64.4   %  FL:       28.9  mm     G. Age:  18w 6d         33  %    FL/AC:      21.3   %    20 - 24  HUM:      27.5  mm     G. Age:  18w 6d         41  %  CER:      18.7  mm     G. Age:  18w 3d         13  %  NFT:       4.1  mm  LV:        4.7  mm  CM:        3.8  mm  Est. FW:     268  gm      0 lb 9 oz     36  % ---------------------------------------------------------------------- OB History  Blood Type:   O+  Maternal Racial/Ethnic Group:   Hispanic  Gravidity:    3         Term:   2        Prem:   0        SAB:   0  TOP:          0       Ectopic:  0        Living: 2 ---------------------------------------------------------------------- Gestational Age  LMP:           21w 6d        Date:  09/15/22                  EDD:   06/22/23  U/S Today:     19w 1d                                        EDD:   07/11/23  Best:          19w 1d     Det. ByMarcella Dubs         EDD:   07/11/23                                      (11/23/22) ---------------------------------------------------------------------- Targeted Anatomy  Central Nervous System  Calvarium/Cranial V.:  Appears normal         Cereb./Vermis:          Appears normal  Cavum:                 Appears normal         Cisterna Magna:         Appears normal  Lateral Ventricles:    Appears  normal         Midline Falx:           Appears normal  Choroid Plexus:        Appears normal  Spine  Cervical:              Appears normal         Sacral:                 Appears normal  Thoracic:              Appears normal         Shape/Curvature:        Appears  normal  Lumbar:                Appears normal  Head/Neck  Lips:                  Appears normal         Profile:                Appears normal  Neck:                  Appears normal         Orbits/Eyes:            Appears normal  Nuchal Fold:           Appears normal         Mandible:               Appears normal  Nasal Bone:            Present                Maxilla:                Appears normal  Thorax  4 Chamber View:        Appears normal         Interventr. Septum:     Appears normal  Cardiac Rhythm:        Normal                 Cardiac Axis:           Normal  Cardiac Situs:         Appears normal         Diaphragm:              Appears normal  Rt Outflow Tract:      Appears normal         3 Vessel View:          Appears normal  Lt Outflow Tract:      Appears normal         3 V Trachea View:       Appears normal  Aortic Arch:           Appears normal         IVC:                    Appears normal  Ductal Arch:           Appears normal         Crossing:               Appears normal  SVC:                   Appears normal  Abdomen  Ventral Wall:  Appears normal         Lt Kidney:              Appears normal  Cord Insertion:        Appears normal         Rt Kidney:              Appears normal  Situs:                 Appears normal         Bladder:                Appears normal  Stomach:               Appears normal  Extremities  Lt Humerus:            Appears normal         Lt Femur:               Appears normal  Rt Humerus:            Appears normal         Rt Femur:               Appears normal  Lt Forearm:            Appears normal         Lt Lower Leg:           Appears normal  Rt Forearm:            Appears normal         Rt Lower Leg:           Appears normal  Lt Hand:               Open hand nml          Lt Foot:                Nml heel/foot  Rt Hand:               Open hand nml          Rt Foot:                Nml heel/foot  Other  Umbilical Cord:        Normal 3-vessel        Genitalia:               Female-nml  Comment:     Fetal anatomic survey complete. ---------------------------------------------------------------------- Cervix Uterus Adnexa  Cervix  Length:            4.6  cm.  Normal appearance by transabdominal scan  Uterus  No abnormality visualized.  Right Ovary  Size(cm)     3.03   x   2.39   x  1.77      Vol(ml): 6.71  Within normal limits.  Left Ovary  Size(cm)     3.65   x   2.17   x  1.56      Vol(ml): 6.47  Within normal limits.  Cul De Sac  No free fluid seen.  Adnexa  No adnexal mass visualized ---------------------------------------------------------------------- Comments  The patient is here for an anatomy ultrasound.  Sonographic findings  Single intrauterine pregnancy at 19w 1d.  Fetal cardiac activity:  Observed and appears normal.  Presentation: Cephalic.  The anatomic structures that were well seen appear normal  without evidence of soft markers. The anatomic  survey is  complete.  Fetal biometry shows the estimated fetal weight at the 36  percentile.  Amniotic fluid: Within normal limits.  MVP: 6.05 cm.  Placenta: Posterior.  Adnexa: No adnexal mass visualized.  Cervical length: 4.6 cm.  Recommendations  -EDD should be 07/11/2023 based on  Early Ultrasound  (11/23/22).  -No further ultrasounds are recommended at this time based  on the current indications. If future indications arise (e.g.  size/date discrepancy on fundal height, gestational diabetes  or hypertension) and an ultrasound is to be desired at our  MFM office, please send a referral. ----------------------------------------------------------------------                  Braxton Feathers, DO Electronically Signed Final Report   02/15/2023 01:34 pm ----------------------------------------------------------------------   DG CHEST PORT 1 VIEW  Result Date: 02/10/2023 CLINICAL DATA:  Dizziness EXAM: PORTABLE CHEST 1 VIEW COMPARISON:  None Available. FINDINGS: The cardiomediastinal silhouette is normal There is no focal  consolidation or pulmonary edema. There is no pleural effusion or pneumothorax There is no acute osseous abnormality. IMPRESSION: No radiographic evidence of acute cardiopulmonary process. Electronically Signed   By: Lesia Hausen M.D.   On: 02/10/2023 15:04    Assessment and Plan:  Pregnancy: G3P2002 at [redacted]w[redacted]d 1. [redacted] weeks gestation of pregnancy 2. Supervision of other normal pregnancy, antepartum Normal anatomy scan. No other concerns.  Preterm labor symptoms and general obstetric precautions including but not limited to vaginal bleeding, contractions, leaking of fluid and fetal movement were reviewed in detail with the patient. Please refer to After Visit Summary for other counseling recommendations.   Return in about 4 weeks (around 03/31/2023) for OFFICE OB VISIT (MD or APP).  Future Appointments  Date Time Provider Department Center  03/23/2023  8:55 AM Pelion Bing, MD [facility]  04/13/2023  8:35 AM Reva Bores, MD [facility]  04/13/2023  8:45 AM [facility]  04/27/2023  8:55 AM Reva Bores, MD [facility]    Jaynie Collins, MD

## 2023-03-09 ENCOUNTER — Encounter: Payer: Self-pay | Admitting: *Deleted

## 2023-03-23 ENCOUNTER — Ambulatory Visit (INDEPENDENT_AMBULATORY_CARE_PROVIDER_SITE_OTHER): Payer: Medicaid Other | Admitting: Obstetrics and Gynecology

## 2023-03-23 ENCOUNTER — Telehealth: Payer: Self-pay

## 2023-03-23 VITALS — BP 108/69 | HR 106 | Wt 179.8 lb

## 2023-03-23 DIAGNOSIS — Z3A24 24 weeks gestation of pregnancy: Secondary | ICD-10-CM | POA: Insufficient documentation

## 2023-03-23 DIAGNOSIS — Z8632 Personal history of gestational diabetes: Secondary | ICD-10-CM

## 2023-03-23 DIAGNOSIS — O09299 Supervision of pregnancy with other poor reproductive or obstetric history, unspecified trimester: Secondary | ICD-10-CM

## 2023-03-23 DIAGNOSIS — O99210 Obesity complicating pregnancy, unspecified trimester: Secondary | ICD-10-CM | POA: Insufficient documentation

## 2023-03-23 DIAGNOSIS — D509 Iron deficiency anemia, unspecified: Secondary | ICD-10-CM

## 2023-03-23 DIAGNOSIS — Z6834 Body mass index (BMI) 34.0-34.9, adult: Secondary | ICD-10-CM | POA: Insufficient documentation

## 2023-03-23 DIAGNOSIS — O99019 Anemia complicating pregnancy, unspecified trimester: Secondary | ICD-10-CM

## 2023-03-23 NOTE — Progress Notes (Signed)
   PRENATAL VISIT NOTE  Subjective:  Robin Strickland is a 22 y.o. G3P2002 at [redacted]w[redacted]d being seen today for ongoing prenatal care.  She is currently monitored for the following issues for this low-risk pregnancy and has Iron deficiency anemia of mother during pregnancy; Supervision of other normal pregnancy, antepartum; History of gestational diabetes mellitus (GDM) in prior pregnancy, currently pregnant; Obesity in pregnancy; and BMI 34.0-34.9,adult on their problem list.  Patient reports lethargy at times.  Contractions: Not present. Vag. Bleeding: None.  Movement: Present. Denies leaking of fluid.   The following portions of the patient's history were reviewed and updated as appropriate: allergies, current medications, past family history, past medical history, past social history, past surgical history and problem list.   Objective:   Vitals:   03/23/23 0902  BP: 108/69  Pulse: (!) 106  Weight: 179 lb 12.8 oz (81.6 kg)    Fetal Status: Fetal Heart Rate (bpm): 147 Fundal Height: 24 cm Movement: Present     General:  Alert, oriented and cooperative. Patient is in no acute distress.  Skin: Skin is warm and dry. No rash noted.   Cardiovascular: Normal heart rate noted  Respiratory: Normal respiratory effort, no problems with respiration noted  Abdomen: Soft, gravid, appropriate for gestational age.  Pain/Pressure: Present     Pelvic: Cervical exam deferred        Extremities: Normal range of motion.  Edema: Trace  Mental Status: Normal mood and affect. Normal behavior. Normal judgment and thought content.   Assessment and Plan:  Pregnancy: G3P2002 at [redacted]w[redacted]d 1. History of gestational diabetes mellitus (GDM) in prior pregnancy, currently pregnant Early A1c negative. GTT next visit  2. BMI 34.0-34.9,adult Watch weight. Total weight gain goal d/w her  3. Obesity in pregnancy  4. [redacted] weeks gestation of pregnancy  5. Iron deficiency anemia of mother during  pregnancy Ferritin 11 and Hgb 10.7 on 9/5. D/w her recommend snacking q2-3h and adequate hydration but recommend IV iron given her levels at only 24wks. Pt amenable to this  Preterm labor symptoms and general obstetric precautions including but not limited to vaginal bleeding, contractions, leaking of fluid and fetal movement were reviewed in detail with the patient. Please refer to After Visit Summary for other counseling recommendations.   No follow-ups on file.  Future Appointments  Date Time Provider Department Center  04/13/2023  8:35 AM Reva Bores, MD [facility]  04/13/2023  8:45 AM [facility]  04/27/2023  8:55 AM Reva Bores, MD [facility]  05/11/2023  8:55 AM Reva Bores, MD [facility]  05/25/2023  8:55 AM Reva Bores, MD [facility]    Ripon Bing, MD

## 2023-03-23 NOTE — Telephone Encounter (Signed)
Auth Submission: NO AUTH NEEDED Site of care: Site of care: [facility] Payer: [employer] Medication & CPT/J Code(s) submitted: Venofer (Iron Sucrose) J1756 Route of submission (phone, fax, portal):  Phone # Fax # Auth type: Buy/Bill PB Units/visits requested: 300mg  x 3 doses Reference number:  Approval from: 03/23/23 to 06/07/23

## 2023-03-23 NOTE — Progress Notes (Signed)
ROB: Denies any concerns

## 2023-03-24 DIAGNOSIS — H5213 Myopia, bilateral: Secondary | ICD-10-CM | POA: Diagnosis not present

## 2023-04-01 ENCOUNTER — Ambulatory Visit (INDEPENDENT_AMBULATORY_CARE_PROVIDER_SITE_OTHER): Payer: Medicaid Other

## 2023-04-01 VITALS — BP 99/62 | HR 94 | Temp 98.1°F | Resp 16 | Ht 61.0 in | Wt 178.8 lb

## 2023-04-01 DIAGNOSIS — Z3A25 25 weeks gestation of pregnancy: Secondary | ICD-10-CM

## 2023-04-01 DIAGNOSIS — D508 Other iron deficiency anemias: Secondary | ICD-10-CM | POA: Diagnosis not present

## 2023-04-01 DIAGNOSIS — O99012 Anemia complicating pregnancy, second trimester: Secondary | ICD-10-CM

## 2023-04-01 DIAGNOSIS — D509 Iron deficiency anemia, unspecified: Secondary | ICD-10-CM

## 2023-04-01 DIAGNOSIS — Z3A24 24 weeks gestation of pregnancy: Secondary | ICD-10-CM

## 2023-04-01 MED ORDER — SODIUM CHLORIDE 0.9% FLUSH: 10.0000 mL | Freq: Two times a day (BID) | INTRAVENOUS | Status: DC

## 2023-04-01 MED ORDER — DIPHENHYDRAMINE HCL 25 MG PO CAPS
25.0000 mg | ORAL_CAPSULE | Freq: Once | ORAL | Status: AC
  Administered 2023-04-01: 25 mg via ORAL
  Filled 2023-04-01: qty 1

## 2023-04-01 MED ORDER — IRON SUCROSE 300 MG IVPB - SIMPLE MED
300.0000 mg | Freq: Once | Status: AC
  Administered 2023-04-01: 300 mg via INTRAVENOUS
  Filled 2023-04-01: qty 265

## 2023-04-01 MED ORDER — ACETAMINOPHEN 325 MG PO TABS
650.0000 mg | ORAL_TABLET | Freq: Once | ORAL | Status: AC
  Administered 2023-04-01: 650 mg via ORAL
  Filled 2023-04-01: qty 2

## 2023-04-01 NOTE — Progress Notes (Signed)
Diagnosis: Iron Deficiency Anemia  Provider:  Chilton Greathouse MD  Procedure: IV Infusion  IV Type: Peripheral, IV Location: R Antecubital  Venofer (Iron Sucrose), Dose: 300 mg  Infusion Start Time: 1339  Infusion Stop Time: 1524  Post Infusion IV Care: Observation period completed and Peripheral IV Discontinued  Discharge: Condition: Good, Destination: Home . AVS Declined  Performed by:  Loney Hering, LPN

## 2023-04-08 ENCOUNTER — Ambulatory Visit (INDEPENDENT_AMBULATORY_CARE_PROVIDER_SITE_OTHER): Payer: Medicaid Other

## 2023-04-08 VITALS — BP 98/65 | HR 94 | Temp 97.8°F | Resp 16 | Ht 61.0 in | Wt 179.8 lb

## 2023-04-08 DIAGNOSIS — O99012 Anemia complicating pregnancy, second trimester: Secondary | ICD-10-CM

## 2023-04-08 DIAGNOSIS — Z3A26 26 weeks gestation of pregnancy: Secondary | ICD-10-CM

## 2023-04-08 DIAGNOSIS — D508 Other iron deficiency anemias: Secondary | ICD-10-CM | POA: Diagnosis not present

## 2023-04-08 DIAGNOSIS — D509 Iron deficiency anemia, unspecified: Secondary | ICD-10-CM

## 2023-04-08 DIAGNOSIS — Z3A24 24 weeks gestation of pregnancy: Secondary | ICD-10-CM

## 2023-04-08 MED ORDER — DIPHENHYDRAMINE HCL 25 MG PO CAPS
25.0000 mg | ORAL_CAPSULE | Freq: Once | ORAL | Status: AC
  Administered 2023-04-08: 25 mg via ORAL
  Filled 2023-04-08: qty 1

## 2023-04-08 MED ORDER — IRON SUCROSE 300 MG IVPB - SIMPLE MED
300.0000 mg | Freq: Once | Status: AC
  Administered 2023-04-08: 300 mg via INTRAVENOUS
  Filled 2023-04-08: qty 265

## 2023-04-08 MED ORDER — ACETAMINOPHEN 325 MG PO TABS
650.0000 mg | ORAL_TABLET | Freq: Once | ORAL | Status: AC
  Administered 2023-04-08: 650 mg via ORAL
  Filled 2023-04-08: qty 2

## 2023-04-08 NOTE — Patient Instructions (Signed)
Iron Sucrose Injection What is this medication? IRON SUCROSE (EYE ern SOO krose) treats low levels of iron (iron deficiency anemia) in people with kidney disease. Iron is a mineral that plays an important role in making red blood cells, which carry oxygen from your lungs to the rest of your body. This medicine may be used for other purposes; ask your health care provider or pharmacist if you have questions. COMMON BRAND NAME(S): Venofer What should I tell my care team before I take this medication? They need to know if you have any of these conditions: Anemia not caused by low iron levels Heart disease High levels of iron in the blood Kidney disease Liver disease An unusual or allergic reaction to iron, other medications, foods, dyes, or preservatives Pregnant or trying to get pregnant Breastfeeding How should I use this medication? This medication is for infusion into a vein. It is given in a hospital or clinic setting. Talk to your care team about the use of this medication in children. While this medication may be prescribed for children as young as 2 years for selected conditions, precautions do apply. Overdosage: If you think you have taken too much of this medicine contact a poison control center or emergency room at once. NOTE: This medicine is only for you. Do not share this medicine with others. What if I miss a dose? Keep appointments for follow-up doses. It is important not to miss your dose. Call your care team if you are unable to keep an appointment. What may interact with this medication? Do not take this medication with any of the following: Deferoxamine Dimercaprol Other iron products This medication may also interact with the following: Chloramphenicol Deferasirox This list may not describe all possible interactions. Give your health care provider a list of all the medicines, herbs, non-prescription drugs, or dietary supplements you use. Also tell them if you smoke,  drink alcohol, or use illegal drugs. Some items may interact with your medicine. What should I watch for while using this medication? Visit your care team regularly. Tell your care team if your symptoms do not start to get better or if they get worse. You may need blood work done while you are taking this medication. You may need to follow a special diet. Talk to your care team. Foods that contain iron include: whole grains/cereals, dried fruits, beans, or peas, leafy green vegetables, and organ meats (liver, kidney). What side effects may I notice from receiving this medication? Side effects that you should report to your care team as soon as possible: Allergic reactions--skin rash, itching, hives, swelling of the face, lips, tongue, or throat Low blood pressure--dizziness, feeling faint or lightheaded, blurry vision Shortness of breath Side effects that usually do not require medical attention (report to your care team if they continue or are bothersome): Flushing Headache Joint pain Muscle pain Nausea Pain, redness, or irritation at injection site This list may not describe all possible side effects. Call your doctor for medical advice about side effects. You may report side effects to FDA at 1-800-FDA-1088. Where should I keep my medication? This medication is given in a hospital or clinic. It will not be stored at home. NOTE: This sheet is a summary. It may not cover all possible information. If you have questions about this medicine, talk to your doctor, pharmacist, or health care provider.  2024 Elsevier/Gold Standard (2022-10-29 00:00:00)

## 2023-04-08 NOTE — Progress Notes (Signed)
Diagnosis: Iron Deficiency Anemia  Provider:  Chilton Greathouse MD  Procedure: IV Infusion  IV Type: Peripheral, IV Location: R Antecubital  Venofer (Iron Sucrose), Dose: 300 mg  Infusion Start Time: 1207  Infusion Stop Time: 1350  Post Infusion IV Care: Observation period completed and Peripheral IV Discontinued  Discharge: Condition: Good, Destination: Home . AVS Declined  Performed by:  Rico Ala, LPN

## 2023-04-13 ENCOUNTER — Ambulatory Visit: Payer: Medicaid Other | Admitting: Family Medicine

## 2023-04-13 ENCOUNTER — Other Ambulatory Visit: Payer: Medicaid Other

## 2023-04-13 VITALS — BP 117/72 | HR 93 | Wt 183.2 lb

## 2023-04-13 DIAGNOSIS — Z3480 Encounter for supervision of other normal pregnancy, unspecified trimester: Secondary | ICD-10-CM

## 2023-04-13 DIAGNOSIS — Z23 Encounter for immunization: Secondary | ICD-10-CM

## 2023-04-13 DIAGNOSIS — O09299 Supervision of pregnancy with other poor reproductive or obstetric history, unspecified trimester: Secondary | ICD-10-CM

## 2023-04-13 DIAGNOSIS — Z3A27 27 weeks gestation of pregnancy: Secondary | ICD-10-CM

## 2023-04-13 DIAGNOSIS — O09292 Supervision of pregnancy with other poor reproductive or obstetric history, second trimester: Secondary | ICD-10-CM

## 2023-04-13 DIAGNOSIS — Z3009 Encounter for other general counseling and advice on contraception: Secondary | ICD-10-CM

## 2023-04-13 DIAGNOSIS — Z8632 Personal history of gestational diabetes: Secondary | ICD-10-CM

## 2023-04-13 NOTE — Progress Notes (Signed)
ROB Pelvic pressure

## 2023-04-13 NOTE — Progress Notes (Signed)
    PRENATAL VISIT NOTE  Subjective:  Robin Strickland is a 22 y.o. G3P2002 at [redacted]w[redacted]d being seen today for ongoing prenatal care.  She is currently monitored for the following issues for this low-risk pregnancy and has Iron deficiency anemia of mother during pregnancy; Supervision of other normal pregnancy, antepartum; History of gestational diabetes mellitus (GDM) in prior pregnancy, currently pregnant; Obesity in pregnancy; BMI 34.0-34.9,adult; and [redacted] weeks gestation of pregnancy on their problem list.  Patient reports no complaints.  Contractions: Irregular. Vag. Bleeding: None.  Movement: Present. Denies leaking of fluid.   The following portions of the patient's history were reviewed and updated as appropriate: allergies, current medications, past family history, past medical history, past social history, past surgical history and problem list.   Objective:   Vitals:   04/13/23 0858  BP: 117/72  Pulse: 93  Weight: 183 lb 3.2 oz (83.1 kg)    Fetal Status: Fetal Heart Rate (bpm): 145 Fundal Height: 27 cm Movement: Present     General:  Alert, oriented and cooperative. Patient is in no acute distress.  Skin: Skin is warm and dry. No rash noted.   Cardiovascular: Normal heart rate noted  Respiratory: Normal respiratory effort, no problems with respiration noted  Abdomen: Soft, gravid, appropriate for gestational age.  Pain/Pressure: Present     Pelvic: Cervical exam deferred        Extremities: Normal range of motion.  Edema: None  Mental Status: Normal mood and affect. Normal behavior. Normal judgment and thought content.   Assessment and Plan:  Pregnancy: G3P2002 at [redacted]w[redacted]d 1. Supervision of other normal pregnancy, antepartum 28 week labs and TDaP today  2. History of gestational diabetes mellitus (GDM) in prior pregnancy, currently pregnant 2 hour today  3. [redacted] weeks gestation of pregnancy   4. Encounter for consultation for female sterilization BTL papers  signed.  Preterm labor symptoms and general obstetric precautions including but not limited to vaginal bleeding, contractions, leaking of fluid and fetal movement were reviewed in detail with the patient. Please refer to After Visit Summary for other counseling recommendations.   Return in 2 weeks (on 04/27/2023).  Future Appointments  Date Time Provider Department Center  04/15/2023 10:30 AM [facility] None  04/27/2023  8:55 AM Reva Bores, MD [facility]  05/11/2023  8:55 AM Reva Bores, MD [facility]  05/25/2023  8:55 AM Reva Bores, MD [facility]    Reva Bores, MD

## 2023-04-14 ENCOUNTER — Encounter: Payer: Self-pay | Admitting: Family Medicine

## 2023-04-14 ENCOUNTER — Other Ambulatory Visit: Payer: Self-pay

## 2023-04-14 DIAGNOSIS — O24419 Gestational diabetes mellitus in pregnancy, unspecified control: Secondary | ICD-10-CM | POA: Insufficient documentation

## 2023-04-14 DIAGNOSIS — O24410 Gestational diabetes mellitus in pregnancy, diet controlled: Secondary | ICD-10-CM

## 2023-04-14 LAB — CBC
Hematocrit: 35.9 % (ref 34.0–46.6)
Hemoglobin: 11.6 g/dL (ref 11.1–15.9)
MCH: 30.6 pg (ref 26.6–33.0)
MCHC: 32.3 g/dL (ref 31.5–35.7)
MCV: 95 fL (ref 79–97)
Platelets: 372 10*3/uL (ref 150–450)
RBC: 3.79 x10E6/uL (ref 3.77–5.28)
RDW: 13.1 % (ref 11.7–15.4)
WBC: 12.7 10*3/uL — ABNORMAL HIGH (ref 3.4–10.8)

## 2023-04-14 LAB — RPR: RPR Ser Ql: NONREACTIVE

## 2023-04-14 LAB — GLUCOSE TOLERANCE, 2 HOURS W/ 1HR
Glucose, 1 hour: 194 mg/dL — ABNORMAL HIGH (ref 70–179)
Glucose, 2 hour: 143 mg/dL (ref 70–152)
Glucose, Fasting: 82 mg/dL (ref 70–91)

## 2023-04-14 LAB — HIV ANTIBODY (ROUTINE TESTING W REFLEX): HIV Screen 4th Generation wRfx: NONREACTIVE

## 2023-04-14 MED ORDER — ACCU-CHEK GUIDE VI STRP: ORAL_STRIP | 12 refills | Status: DC

## 2023-04-14 MED ORDER — ACCU-CHEK SOFTCLIX LANCETS MISC: 1.0000 | Freq: Four times a day (QID) | 12 refills | Status: DC

## 2023-04-14 MED ORDER — ACCU-CHEK GUIDE W/DEVICE KIT: 1.0000 | PACK | Freq: Four times a day (QID) | 0 refills | Status: DC

## 2023-04-15 ENCOUNTER — Ambulatory Visit: Payer: Medicaid Other

## 2023-04-15 VITALS — BP 98/65 | HR 83 | Temp 98.2°F | Resp 18 | Ht 61.0 in | Wt 183.6 lb

## 2023-04-15 DIAGNOSIS — D508 Other iron deficiency anemias: Secondary | ICD-10-CM

## 2023-04-15 DIAGNOSIS — D509 Iron deficiency anemia, unspecified: Secondary | ICD-10-CM

## 2023-04-15 DIAGNOSIS — O99012 Anemia complicating pregnancy, second trimester: Secondary | ICD-10-CM

## 2023-04-15 DIAGNOSIS — Z3A24 24 weeks gestation of pregnancy: Secondary | ICD-10-CM

## 2023-04-15 DIAGNOSIS — Z3A27 27 weeks gestation of pregnancy: Secondary | ICD-10-CM | POA: Diagnosis not present

## 2023-04-15 MED ORDER — DIPHENHYDRAMINE HCL 25 MG PO CAPS
25.0000 mg | ORAL_CAPSULE | Freq: Once | ORAL | Status: AC
Start: 2023-04-15 — End: 2023-04-15
  Administered 2023-04-15: 25 mg via ORAL
  Filled 2023-04-15: qty 1

## 2023-04-15 MED ORDER — ACETAMINOPHEN 325 MG PO TABS
650.0000 mg | ORAL_TABLET | Freq: Once | ORAL | Status: AC
  Administered 2023-04-15: 650 mg via ORAL
  Filled 2023-04-15: qty 2

## 2023-04-15 MED ORDER — IRON SUCROSE 300 MG IVPB - SIMPLE MED
300.0000 mg | Freq: Once | Status: AC
  Administered 2023-04-15: 300 mg via INTRAVENOUS
  Filled 2023-04-15: qty 265

## 2023-04-15 NOTE — Progress Notes (Signed)
Diagnosis: Acute Anemia  Provider:  Chilton Greathouse MD  Procedure: IV Infusion  IV Type: Peripheral, IV Location: L Forearm  Venofer (Iron Sucrose), Dose: 300 mg  Infusion Start Time: 1110  Infusion Stop Time: 1249  Post Infusion IV Care: Observation period completed and Peripheral IV Discontinued  Discharge: Condition: Good, Destination: Home . AVS Declined  Performed by:  Wyvonne Lenz, RN

## 2023-04-20 ENCOUNTER — Encounter: Payer: Medicaid Other | Attending: Family Medicine | Admitting: Dietician

## 2023-04-20 DIAGNOSIS — O24419 Gestational diabetes mellitus in pregnancy, unspecified control: Secondary | ICD-10-CM | POA: Diagnosis present

## 2023-04-20 NOTE — Progress Notes (Signed)
Patient was seen on 04/20/2023 for Gestational Diabetes self-management class at the [facility]. The following learning objectives were met by the patient during this course:  States the definition of Gestational Diabetes States why dietary management is important in controlling blood glucose Describes the effects each nutrient has on blood glucose levels Demonstrates ability to create a balanced meal plan Demonstrates carbohydrate counting  States when to check blood glucose levels Demonstrates proper blood glucose monitoring techniques States the effect of stress and exercise on blood glucose levels States the importance of limiting caffeine and abstaining from alcohol and smoking  Blood glucose monitor presents with: Accu Chek Guide me Fasting: 74, 100 mg/dL  2 hour post prandial: 94, 184  mg/dL   Patient instructed to monitor glucose levels: QID FBS: 60 - <90 1 hour: <140 2 hour: <120  *Patient received handouts: Nutrition Diabetes and Pregnancy Carbohydrate Counting List Blood glucose log Snack ideas for diabetes during pregnancy  Patient will be seen for follow-up as needed.

## 2023-04-27 ENCOUNTER — Ambulatory Visit (INDEPENDENT_AMBULATORY_CARE_PROVIDER_SITE_OTHER): Payer: Medicaid Other | Admitting: Family Medicine

## 2023-04-27 VITALS — BP 106/70 | HR 97 | Wt 186.0 lb

## 2023-04-27 DIAGNOSIS — O99019 Anemia complicating pregnancy, unspecified trimester: Secondary | ICD-10-CM

## 2023-04-27 DIAGNOSIS — D509 Iron deficiency anemia, unspecified: Secondary | ICD-10-CM

## 2023-04-27 DIAGNOSIS — Z3480 Encounter for supervision of other normal pregnancy, unspecified trimester: Secondary | ICD-10-CM

## 2023-04-27 DIAGNOSIS — Z3A29 29 weeks gestation of pregnancy: Secondary | ICD-10-CM

## 2023-04-27 DIAGNOSIS — O4693 Antepartum hemorrhage, unspecified, third trimester: Secondary | ICD-10-CM

## 2023-04-27 DIAGNOSIS — O24410 Gestational diabetes mellitus in pregnancy, diet controlled: Secondary | ICD-10-CM

## 2023-04-27 NOTE — Progress Notes (Signed)
   PRENATAL VISIT NOTE  Subjective:  Robin Strickland is a 22 y.o. G3P2002 at [redacted]w[redacted]d being seen today for ongoing prenatal care.  She is currently monitored for the following issues for this high-risk pregnancy and has Iron deficiency anemia of mother during pregnancy; Supervision of other normal pregnancy, antepartum; History of gestational diabetes mellitus (GDM) in prior pregnancy, currently pregnant; Obesity in pregnancy; BMI 34.0-34.9,adult; [redacted] weeks gestation of pregnancy; and Gestational diabetes on their problem list.  Patient reports  vaginal bleeding on Sunday--bright red with mucous, stopped without intervention. No recent intercourse .  Contractions: Irritability. Vag. Bleeding: None (Not currently).  Movement: Present. Denies leaking of fluid.   The following portions of the patient's history were reviewed and updated as appropriate: allergies, current medications, past family history, past medical history, past social history, past surgical history and problem list.   Objective:   Vitals:   04/27/23 0911  BP: 106/70  Pulse: 97  Weight: 186 lb (84.4 kg)    Fetal Status: Fetal Heart Rate (bpm): 143 Fundal Height: 31 cm Movement: Present     General:  Alert, oriented and cooperative. Patient is in no acute distress.  Skin: Skin is warm and dry. No rash noted.   Cardiovascular: Normal heart rate noted  Respiratory: Normal respiratory effort, no problems with respiration noted  Abdomen: Soft, gravid, appropriate for gestational age.  Pain/Pressure: Present     Pelvic: Cervical exam performed in the presence of a chaperone no blood in vault Dilation: Closed Effacement (%): 20 Station: Ballotable  Extremities: Normal range of motion.  Edema: None  Mental Status: Normal mood and affect. Normal behavior. Normal judgment and thought content.   Assessment and Plan:  Pregnancy: G3P2002 at [redacted]w[redacted]d 1. Diet controlled gestational diabetes mellitus (GDM) in third  trimester No log book, not really checking CBGs regularly, but notes no fastings > 90.  2 hour pp are < 120. Highest CBG is 123.  2. Supervision of other normal pregnancy, antepartum   3. Iron deficiency anemia of mother during pregnancy Repeat CBC @ 32 weeks  4. [redacted] weeks gestation of pregnancy   5. Vaginal bleeding in pregnancy, third trimester Had a lot of diarrhea at the time. She is sure bloo was vaginal. Advised risks and warning signs. To hospital if further bleeding noted.  Preterm labor symptoms and general obstetric precautions including but not limited to vaginal bleeding, contractions, leaking of fluid and fetal movement were reviewed in detail with the patient. Please refer to After Visit Summary for other counseling recommendations.   Return in 2 weeks (on 05/11/2023).  Future Appointments  Date Time Provider Department Center  05/11/2023  8:55 AM Reva Bores, MD [facility]  05/25/2023  8:55 AM Reva Bores, MD [facility]  06/09/2023  8:55 AM [place] Bing, MD [facility]  06/16/2023  8:55 AM Macon Large, Jethro Bastos, MD [facility]  06/23/2023  8:55 AM Macon Large, Jethro Bastos, MD [facility]  06/29/2023  8:55 AM Reva Bores, MD [facility]  07/07/2023  8:55 AM Anyanwu, Jethro Bastos, MD [facility]    Reva Bores, MD

## 2023-04-27 NOTE — Progress Notes (Signed)
ROB: Bleeding vaginally Light to Moderate amount   And having bad cramps   Pt stating that her sugars have been well controlled   Pt finished iron infusion

## 2023-05-10 ENCOUNTER — Ambulatory Visit (INDEPENDENT_AMBULATORY_CARE_PROVIDER_SITE_OTHER): Payer: Medicaid Other | Admitting: Obstetrics & Gynecology

## 2023-05-10 ENCOUNTER — Other Ambulatory Visit (HOSPITAL_COMMUNITY)
Admission: RE | Admit: 2023-05-10 | Discharge: 2023-05-10 | Disposition: A | Discharge status: Home / Self Care | Payer: Medicaid Other | Source: Ambulatory Visit | Attending: Obstetrics & Gynecology | Admitting: Obstetrics & Gynecology

## 2023-05-10 VITALS — BP 110/70 | HR 92 | Wt 189.0 lb

## 2023-05-10 DIAGNOSIS — O23599 Infection of other part of genital tract in pregnancy, unspecified trimester: Secondary | ICD-10-CM

## 2023-05-10 DIAGNOSIS — O368130 Decreased fetal movements, third trimester, not applicable or unspecified: Secondary | ICD-10-CM

## 2023-05-10 DIAGNOSIS — N898 Other specified noninflammatory disorders of vagina: Secondary | ICD-10-CM

## 2023-05-10 DIAGNOSIS — Z3A31 31 weeks gestation of pregnancy: Secondary | ICD-10-CM | POA: Diagnosis not present

## 2023-05-10 DIAGNOSIS — O26893 Other specified pregnancy related conditions, third trimester: Secondary | ICD-10-CM | POA: Diagnosis not present

## 2023-05-10 DIAGNOSIS — Z0371 Encounter for suspected problem with amniotic cavity and membrane ruled out: Secondary | ICD-10-CM

## 2023-05-10 DIAGNOSIS — O368120 Decreased fetal movements, second trimester, not applicable or unspecified: Secondary | ICD-10-CM | POA: Diagnosis not present

## 2023-05-10 DIAGNOSIS — O24410 Gestational diabetes mellitus in pregnancy, diet controlled: Secondary | ICD-10-CM

## 2023-05-10 DIAGNOSIS — O0993 Supervision of high risk pregnancy, unspecified, third trimester: Secondary | ICD-10-CM | POA: Diagnosis not present

## 2023-05-10 DIAGNOSIS — Z3490 Encounter for supervision of normal pregnancy, unspecified, unspecified trimester: Secondary | ICD-10-CM

## 2023-05-10 DIAGNOSIS — O23592 Infection of other part of genital tract in pregnancy, second trimester: Secondary | ICD-10-CM

## 2023-05-10 NOTE — Patient Instructions (Signed)

## 2023-05-10 NOTE — Progress Notes (Signed)
PRENATAL VISIT NOTE  Subjective:  Robin Strickland is a 22 y.o. G3P2002 at [redacted]w[redacted]d being seen today for ongoing prenatal care.  She is currently monitored for the following issues for this high-risk pregnancy and has Iron deficiency anemia of mother during pregnancy; Supervision of high-risk pregnancy; History of gestational diabetes mellitus (GDM) in prior pregnancy, currently pregnant; Obesity in pregnancy; BMI 34.0-34.9,adult; [redacted] weeks gestation of pregnancy; and Gestational diabetes on their problem list.  Patient reports  leaking of fluid x 3 days, feels she "peed' herself initially but now has mucus filled discharge most of the time . Has abdominal cramping off and on.   . Vag. Bleeding: None.  Movement: (!) Decreased (since yesterday). Feels baby does not move as he usually does.   The following portions of the patient's history were reviewed and updated as appropriate: allergies, current medications, past family history, past medical history, past social history, past surgical history and problem list.   Objective:   Vitals:   05/10/23 1056  BP: 110/70  Pulse: 92  Weight: 189 lb (85.7 kg)    Fetal Status: Fetal Heart Rate (bpm): 158 Fundal Height: 35 cm Movement: (!) Decreased (since yesterday)     General:  Alert, oriented and cooperative. Patient is in no acute distress.  Skin: Skin is warm and dry. No rash noted.   Cardiovascular: Normal heart rate noted  Respiratory: Normal respiratory effort, no problems with respiration noted  Abdomen: Soft, gravid, appropriate for gestational age.  Pain/Pressure: Present     Pelvic: Exam performed in the presence of a chaperone.  SSE: neagtive pool, negative AmnioTest (pH indicator). Dilation: Closed Effacement (%): Thick Station: Ballotable - confirmed on sterile digital exam   Extremities: Normal range of motion.  Edema: Moderate pitting, indentation subsides rapidly  Mental Status: Normal mood and affect. Normal behavior.  Normal judgment and thought content.   Assessment and Plan:  Pregnancy: G3P2002 at [redacted]w[redacted]d 1. Encounter for suspected premature rupture of membranes, with rupture of membranes not found Negative evaluation today, PPROM precautiuons reviewed.  2. Vaginal discharge during pregnancy, third trimester - Cervicovaginal ancillary only done, will follow up results and manage accordingly.  3. Decreased fetal movements in second trimester, single or unspecified fetus - Fetal nonstress test done, this was reviewed and was found to be reactive. Fetal movement precautions reviewed with patient.  4. Fundal height high for dates Concerned about possible increased fetal growth or AFV.   - [place] MFM OB FOLLOW UP; Future  5. Diet controlled gestational diabetes mellitus (GDM) in third trimester Did not bring log book, but notes no fastings > 90. 2 hour pp are < 120. Of note, did not bring log book last visit either.  - [place] MFM OB FOLLOW UP; Future  6. [redacted] weeks gestation of pregnancy 7. Supervision of high risk pregnancy in third trimester No other concerns.  Preterm labor symptoms and general obstetric precautions including but not limited to vaginal bleeding, contractions, leaking of fluid and fetal movement were reviewed in detail with the patient. Please refer to After Visit Summary for other counseling recommendations.   Return in about 15 days (around 05/25/2023) for OFFICE OB VISIT (MD only) as scheduled.  Future Appointments  Date Time Provider Department Center  05/25/2023  8:55 AM Reva Bores, MD [facility]  06/09/2023  8:55 AM [place] Bing, MD [facility]  06/16/2023  8:55 AM Macon Large, Jethro Bastos, MD [facility]  06/23/2023  8:55 AM Zymere Patlan, Jethro Bastos, MD  [facility]  06/29/2023  8:55 AM Reva Bores, MD [facility]  07/07/2023  8:55 AM Aolani Piggott, Jethro Bastos, MD [facility]    Jaynie Collins, MD

## 2023-05-11 ENCOUNTER — Other Ambulatory Visit: Payer: Self-pay

## 2023-05-11 ENCOUNTER — Encounter: Payer: Medicaid Other | Admitting: Family Medicine

## 2023-05-11 LAB — CERVICOVAGINAL ANCILLARY ONLY
Bacterial Vaginitis (gardnerella): NEGATIVE
Candida Glabrata: NEGATIVE
Candida Vaginitis: POSITIVE — AB
Chlamydia: NEGATIVE
Comment: NEGATIVE
Comment: NEGATIVE
Comment: NEGATIVE
Comment: NEGATIVE
Comment: NEGATIVE
Comment: NORMAL
Neisseria Gonorrhea: NEGATIVE
Trichomonas: NEGATIVE

## 2023-05-12 MED ORDER — MICONAZOLE NITRATE 2 % VA CREA: 1.0000 | TOPICAL_CREAM | Freq: Every day | VAGINAL | 1 refills | Status: DC

## 2023-05-12 NOTE — Addendum Note (Signed)
Addended by: Jaynie Collins A on: 05/12/2023 08:55 PM   Modules accepted: Orders

## 2023-05-16 ENCOUNTER — Other Ambulatory Visit: Payer: Self-pay

## 2023-05-16 ENCOUNTER — Other Ambulatory Visit: Payer: Self-pay | Admitting: *Deleted

## 2023-05-16 ENCOUNTER — Ambulatory Visit: Payer: Medicaid Other | Attending: Obstetrics and Gynecology | Admitting: *Deleted

## 2023-05-16 ENCOUNTER — Ambulatory Visit: Payer: Medicaid Other

## 2023-05-16 VITALS — BP 116/69 | HR 97

## 2023-05-16 DIAGNOSIS — O99213 Obesity complicating pregnancy, third trimester: Secondary | ICD-10-CM

## 2023-05-16 DIAGNOSIS — O24419 Gestational diabetes mellitus in pregnancy, unspecified control: Secondary | ICD-10-CM

## 2023-05-16 DIAGNOSIS — O24410 Gestational diabetes mellitus in pregnancy, diet controlled: Secondary | ICD-10-CM | POA: Diagnosis not present

## 2023-05-16 DIAGNOSIS — Z3A32 32 weeks gestation of pregnancy: Secondary | ICD-10-CM | POA: Insufficient documentation

## 2023-05-16 DIAGNOSIS — Z3490 Encounter for supervision of normal pregnancy, unspecified, unspecified trimester: Secondary | ICD-10-CM

## 2023-05-16 DIAGNOSIS — H5213 Myopia, bilateral: Secondary | ICD-10-CM | POA: Diagnosis not present

## 2023-05-16 DIAGNOSIS — O0993 Supervision of high risk pregnancy, unspecified, third trimester: Secondary | ICD-10-CM

## 2023-05-16 DIAGNOSIS — E669 Obesity, unspecified: Secondary | ICD-10-CM

## 2023-05-16 DIAGNOSIS — O09293 Supervision of pregnancy with other poor reproductive or obstetric history, third trimester: Secondary | ICD-10-CM

## 2023-05-16 DIAGNOSIS — O99013 Anemia complicating pregnancy, third trimester: Secondary | ICD-10-CM

## 2023-05-18 ENCOUNTER — Encounter: Payer: Self-pay | Admitting: Family Medicine

## 2023-05-21 ENCOUNTER — Encounter (HOSPITAL_COMMUNITY): Payer: Self-pay | Admitting: Obstetrics & Gynecology

## 2023-05-21 ENCOUNTER — Inpatient Hospital Stay (HOSPITAL_COMMUNITY)
Admission: AD | Admit: 2023-05-21 | Discharge: 2023-05-21 | Disposition: A | Discharge status: Home / Self Care | Payer: Medicaid Other | Attending: Obstetrics & Gynecology | Admitting: Obstetrics & Gynecology

## 2023-05-21 DIAGNOSIS — R102 Pelvic and perineal pain: Secondary | ICD-10-CM | POA: Diagnosis present

## 2023-05-21 DIAGNOSIS — O479 False labor, unspecified: Secondary | ICD-10-CM | POA: Diagnosis not present

## 2023-05-21 DIAGNOSIS — O4703 False labor before 37 completed weeks of gestation, third trimester: Secondary | ICD-10-CM | POA: Insufficient documentation

## 2023-05-21 DIAGNOSIS — R197 Diarrhea, unspecified: Secondary | ICD-10-CM | POA: Diagnosis present

## 2023-05-21 DIAGNOSIS — O26893 Other specified pregnancy related conditions, third trimester: Secondary | ICD-10-CM

## 2023-05-21 DIAGNOSIS — Z3A32 32 weeks gestation of pregnancy: Secondary | ICD-10-CM | POA: Diagnosis not present

## 2023-05-21 LAB — URINALYSIS, ROUTINE W REFLEX MICROSCOPIC
Bilirubin Urine: NEGATIVE
Glucose, UA: NEGATIVE mg/dL
Hgb urine dipstick: NEGATIVE
Ketones, ur: NEGATIVE mg/dL
Nitrite: NEGATIVE
Protein, ur: NEGATIVE mg/dL
Specific Gravity, Urine: 1.016 (ref 1.005–1.030)
pH: 6 (ref 5.0–8.0)

## 2023-05-21 NOTE — MAU Note (Signed)
.  Robin Strickland is a 22 y.o. at [redacted]w[redacted]d here in MAU reporting: has been having cramping and "stomach tightening on and off x 3 days.  Stated she is having period like cramping and reports has had diarrhea and some nausea for the past 2 days. Reports increase in vag discharge clear mucus no bleeding . Good fetal movement reported.  LMP:  Onset of complaint: 3 days Pain score: 8 Vitals:   05/21/23 1335  BP: 119/66  Pulse: (!) 105  Resp: 18  Temp: 98.1 F (36.7 C)     FHT:147 Lab orders placed from triage:  u/a

## 2023-05-21 NOTE — MAU Provider Note (Signed)
History     CSN: [id number]  Arrival date and time: 05/21/23 1314   Event Date/Time   First Provider Initiated Contact with Patient 05/21/23 1510      Chief Complaint  Patient presents with   Contractions   Abdominal Pain   HPI Robin Strickland is a 22 y.o. G3P2002 at [redacted]w[redacted]d who presents with abdominal cramping & pelvic pressure. Symptoms started last night. Reports abdominal cramping that occurs about every 10 minutes. Has also had increase in vaginal/pelvic pressure since last night that made it difficult to sleep. Hasn't treated symptoms. Denies dysuria, vaginal bleeding, or LOF. Completed tx for yeast infection last week & denies vaginal symptoms.  Reports good fetal movement.   OB History     Gravida  3   Para  2   Term  2   Preterm      AB      Living  2      SAB      IAB      Ectopic      Multiple  0   Live Births  2           Past Medical History:  Diagnosis Date   Gestational diabetes    During 1st pregnancy.   Headache    History of gestational diabetes 04/19/2016   2021: early a1c negative.    Migraine without aura and without status migrainosus, not intractable 10/28/2017   UTI (urinary tract infection) during pregnancy 07/02/2019   [ ]  needs toc: neg    Past Surgical History:  Procedure Laterality Date   NO PAST SURGERIES      Family History  Problem Relation Age of Onset   Diabetes Mother    Hypertension Mother    Kidney disease Mother    Other Father        unsure of medical history   Diabetes Maternal Grandmother    Hypertension Maternal Grandmother    Kidney disease Maternal Grandmother    Asthma Neg Hx    Cancer Neg Hx    Heart disease Neg Hx     Social History   Tobacco Use   Smoking status: Never   Smokeless tobacco: Never  Vaping Use   Vaping status: Never Used  Substance Use Topics   Alcohol use: No   Drug use: No    Allergies: No Known Allergies  No medications prior to admission.     Review of Systems  All other systems reviewed and are negative.  Physical Exam   Blood pressure (!) 108/59, pulse 80, temperature 98.1 F (36.7 C), resp. rate 18, last menstrual period 09/15/2022, currently breastfeeding.  Physical Exam Vitals and nursing note reviewed.  Constitutional:      General: She is not in acute distress.    Appearance: She is well-developed. She is not ill-appearing.  HENT:     Head: Normocephalic and atraumatic.  Eyes:     General: No scleral icterus.       Right eye: No discharge.        Left eye: No discharge.     Conjunctiva/sclera: Conjunctivae normal.  Pulmonary:     Effort: Pulmonary effort is normal. No respiratory distress.  Genitourinary:    Comments: Dilation: Closed (ext 1) Effacement (%): Thick Cervical Position: Anterior Station: -3 Exam by:: E.Nadia Torr ,NP  Neurological:     General: No focal deficit present.     Mental Status: She is alert.  Psychiatric:  Mood and Affect: Mood normal.        Behavior: Behavior normal.    NST:  Baseline: 140 bpm, Variability: Good {> 6 bpm), Accelerations: Reactive, and Decelerations: Absent  MAU Course  Procedures Results for orders placed or performed during the hospital encounter of 05/21/23 (from the past 24 hours)  Urinalysis, Routine w reflex microscopic -Urine, Clean Catch     Status: Abnormal   Collection Time: 05/21/23  1:38 PM  Result Value Ref Range   Color, Urine YELLOW YELLOW   APPearance HAZY (A) CLEAR   Specific Gravity, Urine 1.016 1.005 - 1.030   pH 6.0 5.0 - 8.0   Glucose, UA NEGATIVE NEGATIVE mg/dL   Hgb urine dipstick NEGATIVE NEGATIVE   Bilirubin Urine NEGATIVE NEGATIVE   Ketones, ur NEGATIVE NEGATIVE mg/dL   Protein, ur NEGATIVE NEGATIVE mg/dL   Nitrite NEGATIVE NEGATIVE   Leukocytes,Ua SMALL (A) NEGATIVE   RBC / HPF 0-5 0 - 5 RBC/hpf   WBC, UA 0-5 0 - 5 WBC/hpf   Bacteria, UA RARE (A) NONE SEEN   Squamous Epithelial / HPF 21-50 0 - 5 /HPF   Mucus  PRESENT     MDM Reactive NST. Ctx x 2 on TOCO Cervix closed x 2  Assessment and Plan   1. Braxton Hick's contraction   2. Pelvic pressure in pregnancy, antepartum, third trimester   3. [redacted] weeks gestation of pregnancy    -Cervix closed & no regular contractions on monitor. Reviewed PTL precautions & reasons to return. Recommend maternity support belt for pelvic pressure.   Judeth Horn 05/21/2023, 5:16 PM

## 2023-05-25 ENCOUNTER — Ambulatory Visit (INDEPENDENT_AMBULATORY_CARE_PROVIDER_SITE_OTHER): Payer: Medicaid Other | Admitting: Family Medicine

## 2023-05-25 ENCOUNTER — Encounter: Payer: Self-pay | Admitting: Family Medicine

## 2023-05-25 VITALS — BP 102/65 | HR 89 | Wt 191.0 lb

## 2023-05-25 DIAGNOSIS — M549 Dorsalgia, unspecified: Secondary | ICD-10-CM | POA: Diagnosis not present

## 2023-05-25 DIAGNOSIS — O24410 Gestational diabetes mellitus in pregnancy, diet controlled: Secondary | ICD-10-CM

## 2023-05-25 DIAGNOSIS — Z3A33 33 weeks gestation of pregnancy: Secondary | ICD-10-CM

## 2023-05-25 DIAGNOSIS — O99891 Other specified diseases and conditions complicating pregnancy: Secondary | ICD-10-CM

## 2023-05-25 DIAGNOSIS — D509 Iron deficiency anemia, unspecified: Secondary | ICD-10-CM

## 2023-05-25 DIAGNOSIS — O26893 Other specified pregnancy related conditions, third trimester: Secondary | ICD-10-CM

## 2023-05-25 DIAGNOSIS — O0993 Supervision of high risk pregnancy, unspecified, third trimester: Secondary | ICD-10-CM

## 2023-05-25 DIAGNOSIS — O99013 Anemia complicating pregnancy, third trimester: Secondary | ICD-10-CM

## 2023-05-25 LAB — POCT URINALYSIS DIPSTICK
Bilirubin, UA: NEGATIVE
Glucose, UA: NEGATIVE
Ketones, UA: NEGATIVE
Nitrite, UA: NEGATIVE
Protein, UA: NEGATIVE
Spec Grav, UA: 1.025 (ref 1.010–1.025)
Urobilinogen, UA: 0.2 U/dL

## 2023-05-25 MED ORDER — CYCLOBENZAPRINE HCL 10 MG PO TABS: 10.0000 mg | ORAL_TABLET | Freq: Three times a day (TID) | ORAL | 1 refills | Status: DC | PRN

## 2023-05-25 NOTE — Progress Notes (Signed)
ROB   Recent MAU visit on 05/21/23 for Ctx's.  Pt worried she may possibly have kidney stones pt denies any dysuria, no bleeding with urination. Notes Braxton hicks contractions and back pain. Pain today is 6/10 however, can get intense.  Pt declines cervix check states she  was checked at MAU.

## 2023-05-25 NOTE — Progress Notes (Signed)
   PRENATAL VISIT NOTE  Subjective:  Robin Strickland is a 22 y.o. G3P2002 at [redacted]w[redacted]d being seen today for ongoing prenatal care.  She is currently monitored for the following issues for this low-risk pregnancy and has Iron deficiency anemia of mother during pregnancy; Supervision of high-risk pregnancy; History of gestational diabetes mellitus (GDM) in prior pregnancy, currently pregnant; Obesity in pregnancy; BMI 34.0-34.9,adult; and Gestational diabetes on their problem list.  Patient reports backache. Reports similar pain with  Contractions: Irregular. Vag. Bleeding: None.  Movement: Present. Denies leaking of fluid.   The following portions of the patient's history were reviewed and updated as appropriate: allergies, current medications, past family history, past medical history, past social history, past surgical history and problem list.   Objective:   Vitals:   05/25/23 0911  BP: 102/65  Pulse: 89  Weight: 191 lb (86.6 kg)    Fetal Status: Fetal Heart Rate (bpm): 145   Movement: Present     General:  Alert, oriented and cooperative. Patient is in no acute distress.  Skin: Skin is warm and dry. No rash noted.   Cardiovascular: Normal heart rate noted  Respiratory: Normal respiratory effort, no problems with respiration noted  Abdomen: Soft, gravid, appropriate for gestational age.  Pain/Pressure: Present     Pelvic: Cervical exam deferred        Extremities: Normal range of motion.  Edema: Mild pitting, slight indentation  Mental Status: Normal mood and affect. Normal behavior. Normal judgment and thought content.   Assessment and Plan:  Pregnancy: G3P2002 at [redacted]w[redacted]d 1. Back pain affecting pregnancy in third trimester (Primary) ? Kidney stones Increase her water intake Check urine culture, has blood and leuks today Denies fever/? Chills Check WBC today Trial of flexeril, heat Avoid kidney stone causing substances - soda, calcium containing foods, sweet tea -  Culture, OB Urine - POCT Urinalysis Dipstick  2. Diet controlled gestational diabetes mellitus (GDM) in third trimester No log book, reports all CBGs are normal. Last EFW was 52%  3. Supervision of high risk pregnancy in third trimester   4. Iron deficiency anemia of mother during pregnancy Repeat Hgb today - CBC  5. [redacted] weeks gestation of pregnancy   Preterm labor symptoms and general obstetric precautions including but not limited to vaginal bleeding, contractions, leaking of fluid and fetal movement were reviewed in detail with the patient. Please refer to After Visit Summary for other counseling recommendations.   Return in 2 weeks (on 06/08/2023).  Future Appointments  Date Time Provider Department Center  06/09/2023  8:55 AM [place] Bing, MD [facility]  06/16/2023  8:55 AM [place] Large Jethro Bastos, MD [facility]  06/20/2023 11:15 AM [facility] N[facility]SE [facility]  06/20/2023 11:30 AM [facility] US2 [facility]  06/23/2023  8:55 AM Anyanwu, Jethro Bastos, MD [facility]  06/29/2023  8:55 AM Reva Bores, MD [facility]  07/07/2023  8:55 AM Anyanwu, Jethro Bastos, MD [facility]    Reva Bores, MD

## 2023-05-26 ENCOUNTER — Ambulatory Visit: Payer: Medicaid Other

## 2023-05-27 ENCOUNTER — Encounter: Payer: Self-pay | Admitting: *Deleted

## 2023-05-28 LAB — URINE CULTURE, OB REFLEX

## 2023-05-28 LAB — CULTURE, OB URINE

## 2023-06-04 ENCOUNTER — Inpatient Hospital Stay (HOSPITAL_COMMUNITY)
Admission: AD | Admit: 2023-06-04 | Discharge: 2023-06-04 | Disposition: A | Discharge status: Home / Self Care | Payer: Medicaid Other | Attending: Obstetrics & Gynecology | Admitting: Obstetrics & Gynecology

## 2023-06-04 ENCOUNTER — Encounter (HOSPITAL_COMMUNITY): Payer: Self-pay | Admitting: Obstetrics & Gynecology

## 2023-06-04 ENCOUNTER — Inpatient Hospital Stay (HOSPITAL_COMMUNITY): Payer: Medicaid Other

## 2023-06-04 DIAGNOSIS — O4703 False labor before 37 completed weeks of gestation, third trimester: Secondary | ICD-10-CM | POA: Insufficient documentation

## 2023-06-04 DIAGNOSIS — O368130 Decreased fetal movements, third trimester, not applicable or unspecified: Secondary | ICD-10-CM | POA: Diagnosis present

## 2023-06-04 DIAGNOSIS — R311 Benign essential microscopic hematuria: Secondary | ICD-10-CM | POA: Diagnosis not present

## 2023-06-04 DIAGNOSIS — O26893 Other specified pregnancy related conditions, third trimester: Secondary | ICD-10-CM | POA: Diagnosis not present

## 2023-06-04 DIAGNOSIS — R3129 Other microscopic hematuria: Secondary | ICD-10-CM | POA: Diagnosis not present

## 2023-06-04 DIAGNOSIS — M549 Dorsalgia, unspecified: Secondary | ICD-10-CM | POA: Insufficient documentation

## 2023-06-04 DIAGNOSIS — O4693 Antepartum hemorrhage, unspecified, third trimester: Secondary | ICD-10-CM | POA: Diagnosis not present

## 2023-06-04 DIAGNOSIS — R319 Hematuria, unspecified: Secondary | ICD-10-CM | POA: Diagnosis not present

## 2023-06-04 DIAGNOSIS — Z3A34 34 weeks gestation of pregnancy: Secondary | ICD-10-CM | POA: Insufficient documentation

## 2023-06-04 LAB — CBC WITH DIFFERENTIAL/PLATELET
Abs Immature Granulocytes: 0.11 10*3/uL — ABNORMAL HIGH (ref 0.00–0.07)
Basophils Absolute: 0 10*3/uL (ref 0.0–0.1)
Basophils Relative: 0 %
Eosinophils Absolute: 0.1 10*3/uL (ref 0.0–0.5)
Eosinophils Relative: 1 %
HCT: 35.9 % — ABNORMAL LOW (ref 36.0–46.0)
Hemoglobin: 12 g/dL (ref 12.0–15.0)
Immature Granulocytes: 1 %
Lymphocytes Relative: 22 %
Lymphs Abs: 2.3 10*3/uL (ref 0.7–4.0)
MCH: 31 pg (ref 26.0–34.0)
MCHC: 33.4 g/dL (ref 30.0–36.0)
MCV: 92.8 fL (ref 80.0–100.0)
Monocytes Absolute: 1 10*3/uL (ref 0.1–1.0)
Monocytes Relative: 10 %
Neutro Abs: 6.8 10*3/uL (ref 1.7–7.7)
Neutrophils Relative %: 66 %
Platelets: 321 10*3/uL (ref 150–400)
RBC: 3.87 MIL/uL (ref 3.87–5.11)
RDW: 13.9 % (ref 11.5–15.5)
WBC: 10.4 10*3/uL (ref 4.0–10.5)
nRBC: 0 % (ref 0.0–0.2)

## 2023-06-04 LAB — WET PREP, GENITAL
Sperm: NONE SEEN
Trich, Wet Prep: NONE SEEN
WBC, Wet Prep HPF POC: >=10 — AB (ref <10)
Yeast Wet Prep HPF POC: NONE SEEN

## 2023-06-04 LAB — URINALYSIS, ROUTINE W REFLEX MICROSCOPIC
Bilirubin Urine: NEGATIVE
Glucose, UA: 50 mg/dL — AB
Ketones, ur: NEGATIVE mg/dL
Leukocytes,Ua: NEGATIVE
Nitrite: NEGATIVE
Protein, ur: 30 mg/dL — AB
RBC / HPF: >50 RBC/hpf (ref 0–5)
Specific Gravity, Urine: 1.02 (ref 1.005–1.030)
pH: 6 (ref 5.0–8.0)

## 2023-06-04 MED ORDER — CYCLOBENZAPRINE HCL 5 MG PO TABS
10.0000 mg | ORAL_TABLET | Freq: Once | ORAL | Status: AC
  Administered 2023-06-04: 10 mg via ORAL
  Filled 2023-06-04: qty 2

## 2023-06-04 MED ORDER — ACETAMINOPHEN 500 MG PO TABS
1000.0000 mg | ORAL_TABLET | Freq: Once | ORAL | Status: AC
  Administered 2023-06-04: 1000 mg via ORAL
  Filled 2023-06-04: qty 2

## 2023-06-04 MED ORDER — ACETAMINOPHEN 325 MG PO TABS: 650.0000 mg | ORAL_TABLET | Freq: Four times a day (QID) | ORAL | 0 refills | Status: DC | PRN

## 2023-06-04 NOTE — MAU Note (Signed)
Pt reported in triage that she had not felt the baby move for 2 hours (normal movement prior to that) but reports movement when in exam room. Given button to indicate movement

## 2023-06-04 NOTE — MAU Provider Note (Signed)
Chief Complaint:  Decreased Fetal Movement, Contractions, Vaginal Bleeding, and Back Pain   HPI   Event Date/Time   First Provider Initiated Contact with Patient 06/04/23 1640      Robin Strickland is a 22 y.o. G3P2002 at [redacted]w[redacted]d who presents to maternity admissions reporting c/o lower pelvic pain and pressure when urinating today and she saw scant bleeding of unknown origin. She reports a HX  of kidney stones and reports that she feels that the pain she is experiencing is similar to prior experience with kidney stones and rates her overall pain of 7-9/10 on pain scale. Last seen in the office on 05/25/23 with similar complaints.  Denies any fever, chills, N/V/D today. Denies taking any medication for pain relief but reports had previously been on Flexeril for back spasms . She also notes that she has had some mild ctx today with decreased fetal movements although she acknowledges FM's now. Denies LOF or active VB.   Pregnancy Course:   Past Medical History:  Diagnosis Date   Gestational diabetes    During 1st pregnancy.   Headache    History of gestational diabetes 04/19/2016   2021: early a1c negative.    Migraine without aura and without status migrainosus, not intractable 10/28/2017   UTI (urinary tract infection) during pregnancy 07/02/2019   [ ]  needs toc: neg   OB History  Gravida Para Term Preterm AB Living  3 2 2   2   SAB IAB Ectopic Multiple Live Births     0 2    # Outcome Date GA Lbr Len/2nd Weight Sex Type Anes PTL Lv  3 Current           2 Term 01/19/20 [redacted]w[redacted]d 28:59 / 00:58 3065 g F Vag-Spont EPI  LIV  1 Term 06/17/16 [redacted]w[redacted]d 02:57 / 00:50 3289 g F Vag-Spont EPI  LIV   Past Surgical History:  Procedure Laterality Date   NO PAST SURGERIES     Family History  Problem Relation Age of Onset   Diabetes Mother    Hypertension Mother    Kidney disease Mother    Other Father        unsure of medical history   Diabetes Maternal Grandmother    Hypertension  Maternal Grandmother    Kidney disease Maternal Grandmother    Asthma Neg Hx    Cancer Neg Hx    Heart disease Neg Hx    Social History   Tobacco Use   Smoking status: Never   Smokeless tobacco: Never  Vaping Use   Vaping status: Never Used  Substance Use Topics   Alcohol use: No   Drug use: No   No Known Allergies Medications Prior to Admission  Medication Sig Dispense Refill Last Dose/Taking   Prenatal Vit-Fe Fumarate-FA (MULTIVITAMIN-PRENATAL) 27-0.8 MG TABS tablet Take 1 tablet by mouth daily at 12 noon.   06/04/2023   Accu-Chek Softclix Lancets lancets 1 each by Other route 4 (four) times daily. 100 each 12    Blood Glucose Monitoring Suppl (ACCU-CHEK GUIDE) w/Device KIT 1 Device by Does not apply route 4 (four) times daily. 1 kit 0    cyclobenzaprine (FLEXERIL) 10 MG tablet Take 1 tablet (10 mg total) by mouth every 8 (eight) hours as needed for muscle spasms. 30 tablet 1    ferrous sulfate 325 (65 FE) MG EC tablet Take 1 tablet (325 mg total) by mouth every other day. (Patient not taking: Reported on 04/27/2023) 45 tablet 1  glucose blood (ACCU-CHEK GUIDE) test strip Use to check blood sugars four times a day was instructed 50 each 12     I have reviewed patient's Past Medical Hx, Surgical Hx, Family Hx, Social Hx, medications and allergies.   ROS  Pertinent items noted in HPI and remainder of comprehensive ROS otherwise negative.   PHYSICAL EXAM  Patient Vitals for the past 24 hrs:  BP Temp Temp src Pulse Resp SpO2 Height Weight  06/04/23 1614 -- -- -- -- -- 99 % -- --  06/04/23 1609 -- -- -- -- -- 99 % -- --  06/04/23 1604 -- -- -- -- -- 98 % -- --  06/04/23 1603 117/62 -- -- (!) 104 -- -- -- --  06/04/23 1533 116/66 98 F (36.7 C) Oral (!) 105 19 100 % -- --  06/04/23 1532 -- -- -- -- -- -- 5\' 1"  (1.549 m) 88.6 kg    Constitutional: Well-developed, well-nourished female in no acute distress.  Cardiovascular: normal rate & rhythm, warm and  well-perfused Respiratory: normal effort, no problems with respiration noted GI: Abd soft, non-tender, non-distended MS: Extremities nontender, no edema, normal ROM Neurologic: Alert and oriented x 4.  GU: no CVA tenderness B/L Pelvic: NEFG,  physiologic discharge, no blood visualized in the vault,  cervix appears closed visually.  SVE: 0.5/Thick/-4        Fetal Tracing: Cat 1 reactive Baseline: 140 Variability: moderate Accelerations: yes Decelerations:No  Toco: no ctx   Labs: Results for orders placed or performed during the hospital encounter of 06/04/23 (from the past 24 hours)  Urinalysis, Routine w reflex microscopic -Urine, Clean Catch     Status: Abnormal   Collection Time: 06/04/23  4:20 PM  Result Value Ref Range   Color, Urine YELLOW YELLOW   APPearance HAZY (A) CLEAR   Specific Gravity, Urine 1.020 1.005 - 1.030   pH 6.0 5.0 - 8.0   Glucose, UA 50 (A) NEGATIVE mg/dL   Hgb urine dipstick LARGE (A) NEGATIVE   Bilirubin Urine NEGATIVE NEGATIVE   Ketones, ur NEGATIVE NEGATIVE mg/dL   Protein, ur 30 (A) NEGATIVE mg/dL   Nitrite NEGATIVE NEGATIVE   Leukocytes,Ua NEGATIVE NEGATIVE   RBC / HPF >50 0 - 5 RBC/hpf   WBC, UA 6-10 0 - 5 WBC/hpf   Bacteria, UA FEW (A) NONE SEEN   Squamous Epithelial / HPF 6-10 0 - 5 /HPF   Mucus PRESENT   Wet prep, genital     Status: Abnormal   Collection Time: 06/04/23  5:20 PM   Specimen: PATH Cytology Cervicovaginal Ancillary Only  Result Value Ref Range   Yeast Wet Prep HPF POC NONE SEEN NONE SEEN   Trich, Wet Prep NONE SEEN NONE SEEN   Clue Cells Wet Prep HPF POC PRESENT (A) NONE SEEN   WBC, Wet Prep HPF POC >=10 (A) <10   Sperm NONE SEEN   CBC with Differential/Platelet     Status: Abnormal   Collection Time: 06/04/23  5:52 PM  Result Value Ref Range   WBC 10.4 4.0 - 10.5 K/uL   RBC 3.87 3.87 - 5.11 MIL/uL   Hemoglobin 12.0 12.0 - 15.0 g/dL   HCT 16.1 (L) 09.6 - 04.5 %   MCV 92.8 80.0 - 100.0 fL   MCH 31.0 26.0 - 34.0 pg    MCHC 33.4 30.0 - 36.0 g/dL   RDW 40.9 81.1 - 91.4 %   Platelets 321 150 - 400 K/uL   nRBC 0.0 0.0 -  0.2 %   Neutrophils Relative % 66 %   Neutro Abs 6.8 1.7 - 7.7 K/uL   Lymphocytes Relative 22 %   Lymphs Abs 2.3 0.7 - 4.0 K/uL   Monocytes Relative 10 %   Monocytes Absolute 1.0 0.1 - 1.0 K/uL   Eosinophils Relative 1 %   Eosinophils Absolute 0.1 0.0 - 0.5 K/uL   Basophils Relative 0 %   Basophils Absolute 0.0 0.0 - 0.1 K/uL   Immature Granulocytes 1 %   Abs Immature Granulocytes 0.11 (H) 0.00 - 0.07 K/uL    Imaging:  Renal Ultrasound reviewed and by me with no evidence of renal stomnes or hydronephrosis , unremarkable findings    MDM & MAU COURSE  MDM:  Moderate  MAU Course: Orders Placed This Encounter  Procedures   Wet prep, genital   Culture, OB Urine   US Renal   Urinalysis, Routine w reflex microscopic -Urine, Clean Catch   CBC with Differential/Platelet   Discharge patient   Meds ordered this encounter  Medications   acetaminophen (TYLENOL) tablet 1,000 mg   cyclobenzaprine (FLEXERIL) tablet 10 mg   acetaminophen (TYLENOL) 325 MG tablet    Sig: Take 2 tablets (650 mg total) by mouth every 6 (six) hours as needed for mild pain (pain score 1-3).    Dispense:  30 tablet    Refill:  0    Supervising Provider:   Celedonio Savage [phone]    Patient seen and examined after chart reviewed. Patient r/o for labor , No vaginal bleeding visualized on exam. U/A with trace blood therefore renal ultrasound ordered to r/o obstructing kidney stones, Tylenol and Flexeril ordered for pain relief. PO Hydration provided . NST with reassuring fetal status. Renal ultrasound reviewed by me  with unremarkable findings and no evidence of kidney stones. Patient reports relief with Tylenol and Flexeril . Will plan to discharge home with same medication RX's.   ASSESSMENT   1. [redacted] weeks gestation of pregnancy   2. Benign essential microscopic hematuria   3. False labor before 37  completed weeks of gestation, antepartum, third trimester     PLAN  Discharge home in stable condition with return precautions.  OB f/u as scheduled    Allergies as of 06/04/2023   No Known Allergies      Medication List     TAKE these medications    Accu-Chek Guide test strip Generic drug: glucose blood Use to check blood sugars four times a day was instructed   Accu-Chek Guide w/Device Kit 1 Device by Does not apply route 4 (four) times daily.   Accu-Chek Softclix Lancets lancets 1 each by Other route 4 (four) times daily.   acetaminophen 325 MG tablet Commonly known as: Tylenol Take 2 tablets (650 mg total) by mouth every 6 (six) hours as needed for mild pain (pain score 1-3).   cyclobenzaprine 10 MG tablet Commonly known as: FLEXERIL Take 1 tablet (10 mg total) by mouth every 8 (eight) hours as needed for muscle spasms.   ferrous sulfate 325 (65 FE) MG EC tablet Take 1 tablet (325 mg total) by mouth every other day.   multivitamin-prenatal 27-0.8 MG Tabs tablet Take 1 tablet by mouth daily at 12 noon.       Marcell Barlow, MSN, [facility], [facility] for [employer]

## 2023-06-04 NOTE — MAU Note (Signed)
.  Robin Strickland is a 22 y.o. at [redacted]w[redacted]d here in MAU reporting: DFM, back pain, irregular CTX's, and vaginal (urinary?) bleeding. She reports she was told she had kidney stones last year and wonders if this is why there has been blood in her urine today. She reports no movement for the past two hours. She reports.   Onset of complaint: Yesterday Pain score: 7/10 CTX and 9/10 lower mid back pain  Vitals:   06/04/23 1533  BP: 116/66  Pulse: (!) 105  Resp: 19  Temp: 98 F (36.7 C)  SpO2: 100%     FHT: 143 initial external Lab orders placed from triage: UA

## 2023-06-06 LAB — CULTURE, OB URINE: Culture: <10000 — AB

## 2023-06-06 LAB — GC/CHLAMYDIA PROBE AMP (~~LOC~~) NOT AT ARMC
Chlamydia: NEGATIVE
Comment: NEGATIVE
Comment: NORMAL
Neisseria Gonorrhea: NEGATIVE

## 2023-06-08 NOTE — L&D Delivery Note (Signed)
OB/GYN Faculty Practice Delivery Note  Robin Strickland is a 23 y.o. [id number] s/p SVD at [redacted]w[redacted]d. She was admitted for IOLD for A1GDM.   ROM: 2h 60m with clear fluid GBS Status: Negative/-- (01/09 0924) Maximum Maternal Temperature: Temp (24hrs), Avg:97.9 F (36.6 C), Min:97.8 F (36.6 C), Max:98 F (36.7 C)  Labor Progress: Initial SVE: 2.5/0/-3. Pitocin and IP Foley required. She then progressed to complete.   Delivery Date/Time: 07/04/23 @0959  Delivery: Called to room and patient was complete and pushing. Head delivered OA to ROA. nuchal x2 present which was delivered through . Shoulder and body delivered in usual fashion. Infant with spontaneous cry, placed on mother's abdomen, dried and stimulated. Cord clamped x 2 after +1-minute delay, and cut by FOB. Cord gases not obtained. Cord blood drawn. Placenta delivered spontaneously with gentle cord traction. Fundus firm with massage and Pitocin. Labia, perineum, and vagina inspected with  L labial which were hemostatic and did not require repair. Mom and baby to postpartum. Baby Weight: pending  Placenta: 3 vessel, intact. Sent to L&D Complications: None Lacerations: L labial EBL: 307 mL Anesthesia: epidural  Infant: baby boy Morton Stall APGAR (1 MIN):  9 APGAR (5 MINS):  9 APGAR (10 MINS):    Margie Billet, MD [facility] Fellow, [facility] for [facility], [facility] 07/04/2023, 10:25 AM

## 2023-06-09 ENCOUNTER — Ambulatory Visit: Payer: Medicaid Other

## 2023-06-09 ENCOUNTER — Encounter: Payer: Medicaid Other | Admitting: Obstetrics and Gynecology

## 2023-06-16 ENCOUNTER — Ambulatory Visit (INDEPENDENT_AMBULATORY_CARE_PROVIDER_SITE_OTHER): Payer: Medicaid Other | Admitting: Obstetrics & Gynecology

## 2023-06-16 VITALS — BP 108/73 | HR 93 | Wt 195.0 lb

## 2023-06-16 DIAGNOSIS — O0993 Supervision of high risk pregnancy, unspecified, third trimester: Secondary | ICD-10-CM

## 2023-06-16 DIAGNOSIS — O24410 Gestational diabetes mellitus in pregnancy, diet controlled: Secondary | ICD-10-CM

## 2023-06-16 DIAGNOSIS — Z2911 Encounter for prophylactic immunotherapy for respiratory syncytial virus (RSV): Secondary | ICD-10-CM | POA: Diagnosis not present

## 2023-06-16 DIAGNOSIS — Z3A36 36 weeks gestation of pregnancy: Secondary | ICD-10-CM | POA: Diagnosis not present

## 2023-06-16 NOTE — Progress Notes (Signed)
   PRENATAL VISIT NOTE  Subjective:  Robin Strickland is a 23 y.o. G3P2002 at [redacted]w[redacted]d being seen today for ongoing prenatal care.  She is currently monitored for the following issues for this high-risk pregnancy and has Iron  deficiency anemia of mother during pregnancy; Supervision of high-risk pregnancy; Obesity in pregnancy; BMI 34.0-34.9,adult; and Gestational diabetes on their problem list.  Patient reports no complaints.  Contractions: Irritability. Vag. Bleeding: None.  Movement: Present. Denies leaking of fluid.   The following portions of the patient's history were reviewed and updated as appropriate: allergies, current medications, past family history, past medical history, past social history, past surgical history and problem list.   Objective:   Vitals:   06/16/23 0855  BP: 108/73  Pulse: 93  Weight: 195 lb (88.5 kg)    Fetal Status: Fetal Heart Rate (bpm): 143 Fundal Height: 37 cm Movement: Present  Presentation: Vertex  General:  Alert, oriented and cooperative. Patient is in no acute distress.  Skin: Skin is warm and dry. No rash noted.   Cardiovascular: Normal heart rate noted  Respiratory: Normal respiratory effort, no problems with respiration noted  Abdomen: Soft, gravid, appropriate for gestational age.  Pain/Pressure: Present     Pelvic: Cervical exam performed in the presence of a chaperone Dilation: 3 Effacement (%): 60 Station: -3, GBS culture obtained  Extremities: Normal range of motion.  Edema: None  Mental Status: Normal mood and affect. Normal behavior. Normal judgment and thought content.    Assessment and Plan:  Pregnancy: G3P2002 at [redacted]w[redacted]d 1. Diet controlled gestational diabetes mellitus (GDM) in third trimester (Primary) Did not bring log, but reports fastings 90s or less, and PP <120.  05/16/23 EFW 52%, scheduled for next one on 06/20/23.  IOL 39-40 weeks if continues to be stable.  2. Need for prophylactic vaccination and inoculation  against respiratory syncytial virus (RSV) RSV Vaccine administered - Respiratory syncytial virus vaccine, preF, subunit, bivalent,(Abrysvo)  3. [redacted] weeks gestation of pregnancy 4. Supervision of high risk pregnancy in third trimester Negative GC/Chlam on 06/04/23, GBS culture done today, will follow up results and manage accordingly. - Strep Gp B NAA Labor symptoms and general obstetric precautions including but not limited to vaginal bleeding, contractions, leaking of fluid and fetal movement were reviewed in detail with the patient. Please refer to After Visit Summary for other counseling recommendations.   Return in about 1 week (around 06/23/2023) for OFFICE OB VISIT (MD or APP).  Future Appointments  Date Time Provider Department Center  06/20/2023 11:15 AM [facility] NURSE [facility]  06/20/2023 11:30 AM [facility] US2 [facility]  06/23/2023  8:55 AM Cyrena Kuchenbecker, Gloris LABOR, MD [facility]  06/29/2023  8:55 AM Fredirick Glenys RAMAN, MD [facility]  07/06/2023  9:35 AM Fredirick Glenys RAMAN, MD [facility]    Gloris Hugger, MD

## 2023-06-18 LAB — STREP GP B NAA: Strep Gp B NAA: NEGATIVE

## 2023-06-20 ENCOUNTER — Ambulatory Visit: Payer: Medicaid Other | Attending: Obstetrics | Admitting: *Deleted

## 2023-06-20 ENCOUNTER — Ambulatory Visit: Payer: Medicaid Other

## 2023-06-20 VITALS — BP 125/61 | HR 77

## 2023-06-20 DIAGNOSIS — O99213 Obesity complicating pregnancy, third trimester: Secondary | ICD-10-CM | POA: Diagnosis not present

## 2023-06-20 DIAGNOSIS — O24410 Gestational diabetes mellitus in pregnancy, diet controlled: Secondary | ICD-10-CM | POA: Diagnosis not present

## 2023-06-20 DIAGNOSIS — O99013 Anemia complicating pregnancy, third trimester: Secondary | ICD-10-CM | POA: Insufficient documentation

## 2023-06-20 DIAGNOSIS — E669 Obesity, unspecified: Secondary | ICD-10-CM | POA: Diagnosis not present

## 2023-06-20 DIAGNOSIS — O09293 Supervision of pregnancy with other poor reproductive or obstetric history, third trimester: Secondary | ICD-10-CM | POA: Diagnosis not present

## 2023-06-20 DIAGNOSIS — Z3A37 37 weeks gestation of pregnancy: Secondary | ICD-10-CM | POA: Insufficient documentation

## 2023-06-20 DIAGNOSIS — O24419 Gestational diabetes mellitus in pregnancy, unspecified control: Secondary | ICD-10-CM

## 2023-06-20 DIAGNOSIS — O0993 Supervision of high risk pregnancy, unspecified, third trimester: Secondary | ICD-10-CM

## 2023-06-21 ENCOUNTER — Inpatient Hospital Stay (HOSPITAL_COMMUNITY)
Admission: AD | Admit: 2023-06-21 | Discharge: 2023-06-21 | Disposition: A | Discharge status: Home / Self Care | Payer: Medicaid Other | Attending: Obstetrics and Gynecology | Admitting: Obstetrics and Gynecology

## 2023-06-21 ENCOUNTER — Encounter (HOSPITAL_COMMUNITY): Payer: Self-pay | Admitting: Obstetrics and Gynecology

## 2023-06-21 DIAGNOSIS — Z0371 Encounter for suspected problem with amniotic cavity and membrane ruled out: Secondary | ICD-10-CM | POA: Diagnosis not present

## 2023-06-21 DIAGNOSIS — L299 Pruritus, unspecified: Secondary | ICD-10-CM | POA: Diagnosis not present

## 2023-06-21 DIAGNOSIS — Z3A37 37 weeks gestation of pregnancy: Secondary | ICD-10-CM | POA: Insufficient documentation

## 2023-06-21 DIAGNOSIS — O471 False labor at or after 37 completed weeks of gestation: Secondary | ICD-10-CM | POA: Insufficient documentation

## 2023-06-21 LAB — WET PREP, GENITAL
Clue Cells Wet Prep HPF POC: NONE SEEN
Sperm: NONE SEEN
Trich, Wet Prep: NONE SEEN
WBC, Wet Prep HPF POC: <10 (ref <10)
Yeast Wet Prep HPF POC: NONE SEEN

## 2023-06-21 LAB — HEPATIC FUNCTION PANEL
ALT: 12 U/L (ref 0–44)
AST: 14 U/L — ABNORMAL LOW (ref 15–41)
Albumin: 2.9 g/dL — ABNORMAL LOW (ref 3.5–5.0)
Alkaline Phosphatase: 107 U/L (ref 38–126)
Bilirubin, Direct: <0.1 mg/dL (ref 0.0–0.2)
Total Bilirubin: 0.5 mg/dL (ref 0.0–1.2)
Total Protein: 7.1 g/dL (ref 6.5–8.1)

## 2023-06-21 LAB — RUPTURE OF MEMBRANE (ROM)PLUS: Rom Plus: NEGATIVE

## 2023-06-21 LAB — POCT FERN TEST: POCT Fern Test: NEGATIVE

## 2023-06-21 MED ORDER — DIPHENHYDRAMINE HCL 25 MG PO TABS: 25.0000 mg | ORAL_TABLET | Freq: Four times a day (QID) | ORAL | Status: DC | PRN

## 2023-06-21 NOTE — MAU Note (Addendum)
.  Robin Strickland is a 23 y.o. at [redacted]w[redacted]d here in MAU reporting: ctx and LOF for the past two days. She also reports new onset swelling of her hands and feet, along with tingling and itched over the past several days. Called OB and instructed to come in for eval.   RN notes mucous discharge on eval.   Contractions every: 10 minutes Onset of ctx: 2 days ago Pain score: 6/10  ROM: Possible; leaking of clear/thick/watery fluid for the past two days; soaked a chair today; reports last IC 3 days ago. Vaginal Bleeding: Bloody show Last SVE: 3/60/-3 on 06/16/23 Labor Pain Management Plan: Undecided  Fetal Movement: Reports positive FM FHT: Fetal Heart Rate Mode: External Baseline Rate (A): 150 bpm  Vitals:   06/21/23 1525 06/21/23 1527  BP:  115/62  Pulse:  (!) 121  Resp:  20  Temp: 98 F (36.7 C)   SpO2: 100%      OB Office: Faculty GBS: Negative Lab orders placed from triage: MAU Labor Eval and Fern swab

## 2023-06-21 NOTE — Discharge Instructions (Signed)
 Were seen in the MAU today for concern of leaking fluid.  You were found to not have rupture of membranes.  While you were here you were complaining of itching on your hands and feet.  We showed labs that will return in several hours.  If these return positive we may recommend induction of labor.  When you return home I recommend that you try over the counter Benadryl  25 mg to see if this helps with your itching   Come to the MAU (maternity admission unit) for 1) Strong contractions every 2-3 minutes for at least 1 hour that do not go away when you drink water or take a warm shower. These contractions will be so strong all you can do is breath through them 2) Vaginal bleeding- anything more than spotting 3) Loss of fluid like you broke your water 4) Decreased movement of your baby

## 2023-06-21 NOTE — MAU Note (Signed)
 RN into lobby to all patient. Patient not in lobby.

## 2023-06-21 NOTE — MAU Provider Note (Signed)
 History     CSN: [id number]  Arrival date and time: 06/21/23 1504   Event Date/Time   First Provider Initiated Contact with Patient 06/21/23 1703      Chief Complaint  Patient presents with   Contractions   Rupture of Membranes   HPI Patient is 23 y.o. [id number] [redacted]w[redacted]d here with complaints of leaking fluid for the past 3 days. Reports last sex was 3 days ago but reports fluid leaking proceeded her having sex. She reports leaking intermittently. Notably recently took her daughter to a jump park and noted leaking with wet underwear. She reports contractions that are intermittent. No vagina odor.   Also noted sudden onset of itching the past 2 days. Tried creams but nothing helped. Reports mostly on palms and soles of feet.   +FM, denies LOF, VB, contractions, vaginal discharge.   OB History     Gravida  3   Para  2   Term  2   Preterm      AB      Living  2      SAB      IAB      Ectopic      Multiple  0   Live Births  2           Past Medical History:  Diagnosis Date   Gestational diabetes    During 1st pregnancy.   Headache    History of gestational diabetes 04/19/2016   2021: early a1c negative.    Migraine without aura and without status migrainosus, not intractable 10/28/2017   UTI (urinary tract infection) during pregnancy 07/02/2019   [ ]  needs toc: neg    Past Surgical History:  Procedure Laterality Date   NO PAST SURGERIES      Family History  Problem Relation Age of Onset   Diabetes Mother    Hypertension Mother    Kidney disease Mother    Other Father        unsure of medical history   Diabetes Maternal Grandmother    Hypertension Maternal Grandmother    Kidney disease Maternal Grandmother    Asthma Neg Hx    Cancer Neg Hx    Heart disease Neg Hx     Social History   Tobacco Use   Smoking status: Never   Smokeless tobacco: Never  Vaping Use   Vaping status: Never Used  Substance Use Topics   Alcohol use: No   Drug  use: No    Allergies: No Known Allergies  Medications Prior to Admission  Medication Sig Dispense Refill Last Dose/Taking   Prenatal Vit-Fe Fumarate-FA (MULTIVITAMIN-PRENATAL) 27-0.8 MG TABS tablet Take 1 tablet by mouth daily at 12 noon.   06/21/2023   Accu-Chek Softclix Lancets lancets 1 each by Other route 4 (four) times daily. 100 each 12    acetaminophen  (TYLENOL ) 325 MG tablet Take 2 tablets (650 mg total) by mouth every 6 (six) hours as needed for mild pain (pain score 1-3). 30 tablet 0    Blood Glucose Monitoring Suppl (ACCU-CHEK GUIDE) w/Device KIT 1 Device by Does not apply route 4 (four) times daily. 1 kit 0    cyclobenzaprine  (FLEXERIL ) 10 MG tablet Take 1 tablet (10 mg total) by mouth every 8 (eight) hours as needed for muscle spasms. 30 tablet 1    ferrous sulfate  325 (65 FE) MG EC tablet Take 1 tablet (325 mg total) by mouth every other day. (Patient not taking: Reported on 04/27/2023) 45 tablet 1  glucose blood (ACCU-CHEK GUIDE) test strip Use to check blood sugars four times a day was instructed 50 each 12     Review of Systems  Constitutional:  Negative for chills and fever.  Respiratory:  Negative for cough and shortness of breath.   Cardiovascular:  Negative for chest pain.  Gastrointestinal:  Negative for nausea and vomiting.  Genitourinary:  Negative for dysuria, flank pain and frequency.  Musculoskeletal:  Negative for myalgias.  Skin:  Negative for rash.  Neurological:  Negative for dizziness, weakness and headaches.  Hematological:  Does not bruise/bleed easily.  Psychiatric/Behavioral:  Negative for suicidal ideas. The patient is not nervous/anxious.    Physical Exam   Blood pressure 117/64, pulse (!) 106, temperature 98 F (36.7 C), temperature source Oral, resp. rate 20, height 5' 1 (1.549 m), weight 84.3 kg, last menstrual period 09/15/2022, SpO2 100%, currently breastfeeding.  Physical Exam Vitals and nursing note reviewed.  Constitutional:       General: She is not in acute distress.    Appearance: She is well-developed.     Comments: Pregnant female  HENT:     Head: Normocephalic and atraumatic.  Eyes:     General: No scleral icterus.    Conjunctiva/sclera: Conjunctivae normal.  Cardiovascular:     Rate and Rhythm: Normal rate.  Pulmonary:     Effort: Pulmonary effort is normal.  Chest:     Chest wall: No tenderness.  Abdominal:     Palpations: Abdomen is soft.     Tenderness: There is no abdominal tenderness. There is no guarding or rebound.     Comments: Gravid  Genitourinary:    Vagina: Normal.  Musculoskeletal:        General: Normal range of motion.     Cervical back: Normal range of motion and neck supple.  Skin:    General: Skin is warm and dry.     Findings: No rash.  Neurological:     Mental Status: She is alert and oriented to person, place, and time.    Initial Cervical exam (RN)  FT/thick  17:00 Cervical exam check Dilation: 1 Effacement (%): 50 Station: -3 Presentation: Vertex Exam by:: Suzen Masters, MD   MAU Course  Procedures I reviewed the patient's fetal monitoring.  Baseline HR: 135 Variability:  moderate Accels:present Decels: none  A/P: Reactive NST  Reassured regarding fetal status.    MDM: Moderate  This patient presents to the ED for concern of   Chief Complaint  Patient presents with   Contractions   Rupture of Membranes     These complains involves an extensive number of treatment options, and is a complaint that carries with it a high risk of complications and morbidity.  The differential diagnosis for  1. Leaking fluid INCLUDES ROM, vaginal discharge normal variant  2. Itching INCLUDES seasonal variant, normal variant. Less likely cholestasis of pregnancy  Co morbidities that complicate the patient evaluation: Patient Active Problem List   Diagnosis Date Noted   Gestational diabetes 04/14/2023   Obesity in pregnancy 03/23/2023   BMI 34.0-34.9,adult 03/23/2023    Supervision of high-risk pregnancy 12/29/2022   Iron  deficiency anemia of mother during pregnancy 02/17/2016    External records from outside source obtained and reviewed including Prenatal care records  I ordered, and personally interpreted labs.  The pertinent results include:   Results for orders placed or performed during the hospital encounter of 06/21/23 (from the past 24 hours)  POCT fern test     Status:  Normal   Collection Time: 06/21/23  3:47 PM  Result Value Ref Range   POCT Fern Test Negative = intact amniotic membranes   Rupture of Membrane (ROM) Plus     Status: None   Collection Time: 06/21/23  4:02 PM  Result Value Ref Range   Rom Plus NEGATIVE   Wet prep, genital     Status: None   Collection Time: 06/21/23  4:02 PM  Result Value Ref Range   Yeast Wet Prep HPF POC NONE SEEN NONE SEEN   Trich, Wet Prep NONE SEEN NONE SEEN   Clue Cells Wet Prep HPF POC NONE SEEN NONE SEEN   WBC, Wet Prep HPF POC <10 <10   Sperm NONE SEEN     MAU Course: Sterile spec exam fro fern slide negative RN collected ROM +  After the interventions noted above, I reevaluated the patient and found that they have :improved  Dispostion: discharged    Assessment and Plan   1. Encounter for suspected premature rupture of amniotic membranes, with rupture of membranes not found   2. Itching   3. [redacted] weeks gestation of pregnancy    - LFTs and Bile acids collected - Follow up at scheduled visit on Thursday - Return precautions given regarding labor  - Reviewed that if bile acids are positive likely would recommend IOL at that time.  - Reviewed trial of benadryl  at home  Future Appointments  Date Time Provider Department Center  06/23/2023  8:55 AM Anyanwu, Gloris LABOR, MD [facility]  06/29/2023  8:55 AM Fredirick Glenys RAMAN, MD [facility]  07/06/2023  9:35 AM Fredirick Glenys RAMAN, MD [facility]    Suzen Octave [facility] 06/21/2023, 5:27 PM

## 2023-06-22 LAB — BILE ACIDS, TOTAL: Bile Acids Total: 3.7 umol/L (ref 0.0–10.0)

## 2023-06-23 ENCOUNTER — Ambulatory Visit (INDEPENDENT_AMBULATORY_CARE_PROVIDER_SITE_OTHER): Payer: Medicaid Other | Admitting: Obstetrics & Gynecology

## 2023-06-23 VITALS — BP 106/71 | HR 85 | Wt 195.0 lb

## 2023-06-23 DIAGNOSIS — O24410 Gestational diabetes mellitus in pregnancy, diet controlled: Secondary | ICD-10-CM

## 2023-06-23 DIAGNOSIS — O0993 Supervision of high risk pregnancy, unspecified, third trimester: Secondary | ICD-10-CM

## 2023-06-23 DIAGNOSIS — Z3A37 37 weeks gestation of pregnancy: Secondary | ICD-10-CM

## 2023-06-23 NOTE — Progress Notes (Signed)
   PRENATAL VISIT NOTE  Subjective:  Robin Strickland is a 23 y.o. G3P2002 at [redacted]w[redacted]d being seen today for ongoing prenatal care.  She is currently monitored for the following issues for this high-risk pregnancy and has Iron deficiency anemia of mother during pregnancy; Supervision of high-risk pregnancy; Obesity in pregnancy; BMI 34.0-34.9,adult; and Gestational diabetes on their problem list.  Patient reports no complaints.  Contractions: Irregular. Vag. Bleeding: None.  Movement: Present. Denies leaking of fluid.   The following portions of the patient's history were reviewed and updated as appropriate: allergies, current medications, past family history, past medical history, past social history, past surgical history and problem list.   Objective:   Vitals:   06/23/23 0902  BP: 106/71  Pulse: 85  Weight: 195 lb (88.5 kg)    Fetal Status: Fetal Heart Rate (bpm): 152   Movement: Present     General:  Alert, oriented and cooperative. Patient is in no acute distress.  Skin: Skin is warm and dry. No rash noted.   Cardiovascular: Normal heart rate noted  Respiratory: Normal respiratory effort, no problems with respiration noted  Abdomen: Soft, gravid, appropriate for gestational age.  Pain/Pressure: Present     Pelvic: Cervical exam deferred        Extremities: Normal range of motion.  Edema: Moderate pitting, indentation subsides rapidly  Mental Status: Normal mood and affect. Normal behavior. Normal judgment and thought content.   Assessment and Plan:  Pregnancy: G3P2002 at [redacted]w[redacted]d 1. Diet controlled gestational diabetes mellitus (GDM) in third trimester (Primary) Reviewed CBGs, mostly within range.  06/20/23 EFW 50%.   IOL scheduled at 39 weeks (07/04/23) at midnight.  2. [redacted] weeks gestation of pregnancy 3. Supervision of high risk pregnancy in third trimester Negative pelvic cultures.  Term labor symptoms and general obstetric precautions including but not limited to  vaginal bleeding, contractions, leaking of fluid and fetal movement were reviewed in detail with the patient. Please refer to After Visit Summary for other counseling recommendations.   Return in about 1 week (around 06/30/2023) for OFFICE OB VISIT (MD only).  Future Appointments  Date Time Provider Department Center  06/29/2023  8:55 AM Reva Bores, MD [facility]  07/06/2023  9:35 AM Reva Bores, MD [facility]    Jaynie Collins, MD

## 2023-06-23 NOTE — Patient Instructions (Signed)

## 2023-06-27 ENCOUNTER — Encounter (HOSPITAL_COMMUNITY): Payer: Self-pay | Admitting: *Deleted

## 2023-06-27 ENCOUNTER — Telehealth (HOSPITAL_COMMUNITY): Payer: Self-pay | Admitting: *Deleted

## 2023-06-27 NOTE — Telephone Encounter (Signed)
Preadmission screen  

## 2023-06-29 ENCOUNTER — Ambulatory Visit: Payer: Medicaid Other | Admitting: Family Medicine

## 2023-06-29 VITALS — BP 118/74 | HR 102 | Wt 199.8 lb

## 2023-06-29 DIAGNOSIS — O24410 Gestational diabetes mellitus in pregnancy, diet controlled: Secondary | ICD-10-CM

## 2023-06-29 DIAGNOSIS — Z3A38 38 weeks gestation of pregnancy: Secondary | ICD-10-CM

## 2023-06-29 DIAGNOSIS — O0993 Supervision of high risk pregnancy, unspecified, third trimester: Secondary | ICD-10-CM

## 2023-06-29 NOTE — Progress Notes (Signed)
ROB: baby hasn't movement

## 2023-06-29 NOTE — Progress Notes (Signed)
    PRENATAL VISIT NOTE  Subjective:  Robin Strickland is a 23 y.o. G3P2002 at [redacted]w[redacted]d being seen today for ongoing prenatal care.  She is currently monitored for the following issues for this high-risk pregnancy and has Iron deficiency anemia of mother during pregnancy; Supervision of high-risk pregnancy; Obesity in pregnancy; BMI 34.0-34.9,adult; and Gestational diabetes on their problem list.  Patient reports no complaints.  Contractions: Irregular. Vag. Bleeding: None.  Movement: Absent. Denies leaking of fluid.   The following portions of the patient's history were reviewed and updated as appropriate: allergies, current medications, past family history, past medical history, past social history, past surgical history and problem list.   Objective:   Vitals:   06/29/23 0910  BP: 118/74  Pulse: (!) 102  Weight: 199 lb 12.8 oz (90.6 kg)    Fetal Status:     Movement: Absent  Presentation: Vertex  General:  Alert, oriented and cooperative. Patient is in no acute distress.  Skin: Skin is warm and dry. No rash noted.   Cardiovascular: Normal heart rate noted  Respiratory: Normal respiratory effort, no problems with respiration noted  Abdomen: Soft, gravid, appropriate for gestational age.  Pain/Pressure: Present     Pelvic: Cervical exam performed in the presence of a chaperone Dilation: 2 Effacement (%): 50 Station: -2  Extremities: Normal range of motion.  Edema: None  Mental Status: Normal mood and affect. Normal behavior. Normal judgment and thought content.  NST:  Baseline: 145 bpm, Variability: Good {> 6 bpm), Accelerations: Reactive, and Decelerations: Absent  Assessment and Plan:  Pregnancy: G3P2002 at [redacted]w[redacted]d 1. Diet controlled gestational diabetes mellitus (GDM) in third trimester (Primary) No log, reports CBGs are WNL unless has dietary indiscretion, but it happens infrequently. EFW 50% IOL scheduled @ 39 weeks  2. Supervision of high risk pregnancy in third  trimester Decreased FM today NST is reactive - Fetal nonstress test  3. [redacted] weeks gestation of pregnancy   Term labor symptoms and general obstetric precautions including but not limited to vaginal bleeding, contractions, leaking of fluid and fetal movement were reviewed in detail with the patient. Please refer to After Visit Summary for other counseling recommendations.   Return in 1 week (on 07/06/2023).  Future Appointments  Date Time Provider Department Center  07/04/2023 12:00 AM [facility] SCHED [facility] None    Reva Bores, MD

## 2023-06-30 ENCOUNTER — Other Ambulatory Visit: Payer: Self-pay | Admitting: Advanced Practice Midwife

## 2023-07-04 ENCOUNTER — Inpatient Hospital Stay (HOSPITAL_COMMUNITY)
Admission: RE | Admit: 2023-07-04 | Discharge: 2023-07-06 | DRG: 807 | Disposition: A | Discharge status: Home / Self Care | Payer: Medicaid Other | Attending: Family Medicine | Admitting: Family Medicine

## 2023-07-04 ENCOUNTER — Inpatient Hospital Stay (HOSPITAL_COMMUNITY): Payer: Medicaid Other | Admitting: Anesthesiology

## 2023-07-04 ENCOUNTER — Inpatient Hospital Stay (HOSPITAL_COMMUNITY): Payer: Medicaid Other

## 2023-07-04 ENCOUNTER — Encounter (HOSPITAL_COMMUNITY): Payer: Self-pay | Admitting: Obstetrics & Gynecology

## 2023-07-04 DIAGNOSIS — D509 Iron deficiency anemia, unspecified: Secondary | ICD-10-CM | POA: Diagnosis not present

## 2023-07-04 DIAGNOSIS — Z3A39 39 weeks gestation of pregnancy: Secondary | ICD-10-CM

## 2023-07-04 DIAGNOSIS — O99214 Obesity complicating childbirth: Secondary | ICD-10-CM | POA: Diagnosis not present

## 2023-07-04 DIAGNOSIS — O24420 Gestational diabetes mellitus in childbirth, diet controlled: Principal | ICD-10-CM | POA: Diagnosis present

## 2023-07-04 DIAGNOSIS — Z833 Family history of diabetes mellitus: Secondary | ICD-10-CM | POA: Diagnosis not present

## 2023-07-04 DIAGNOSIS — Z8249 Family history of ischemic heart disease and other diseases of the circulatory system: Secondary | ICD-10-CM

## 2023-07-04 DIAGNOSIS — Z30430 Encounter for insertion of intrauterine contraceptive device: Secondary | ICD-10-CM | POA: Diagnosis not present

## 2023-07-04 DIAGNOSIS — O24410 Gestational diabetes mellitus in pregnancy, diet controlled: Principal | ICD-10-CM | POA: Diagnosis present

## 2023-07-04 DIAGNOSIS — O9902 Anemia complicating childbirth: Secondary | ICD-10-CM | POA: Diagnosis not present

## 2023-07-04 DIAGNOSIS — Z8632 Personal history of gestational diabetes: Secondary | ICD-10-CM | POA: Diagnosis present

## 2023-07-04 LAB — GLUCOSE, CAPILLARY
Glucose-Capillary: 116 mg/dL — ABNORMAL HIGH (ref 70–99)
Glucose-Capillary: 117 mg/dL — ABNORMAL HIGH (ref 70–99)

## 2023-07-04 LAB — COMPREHENSIVE METABOLIC PANEL
ALT: 13 U/L (ref 0–44)
AST: 18 U/L (ref 15–41)
Albumin: 2.9 g/dL — ABNORMAL LOW (ref 3.5–5.0)
Alkaline Phosphatase: 115 U/L (ref 38–126)
Anion gap: 11 (ref 5–15)
BUN: 11 mg/dL (ref 6–20)
CO2: 18 mmol/L — ABNORMAL LOW (ref 22–32)
Calcium: 9.1 mg/dL (ref 8.9–10.3)
Chloride: 102 mmol/L (ref 98–111)
Creatinine, Ser: 0.73 mg/dL (ref 0.44–1.00)
GFR, Estimated: >60 mL/min (ref >60)
Glucose, Bld: 131 mg/dL — ABNORMAL HIGH (ref 70–99)
Potassium: 4 mmol/L (ref 3.5–5.1)
Sodium: 131 mmol/L — ABNORMAL LOW (ref 135–145)
Total Bilirubin: 0.6 mg/dL (ref 0.0–1.2)
Total Protein: 7.4 g/dL (ref 6.5–8.1)

## 2023-07-04 LAB — CBC
HCT: 36.3 % (ref 36.0–46.0)
Hemoglobin: 12 g/dL (ref 12.0–15.0)
MCH: 30.5 pg (ref 26.0–34.0)
MCHC: 33.1 g/dL (ref 30.0–36.0)
MCV: 92.1 fL (ref 80.0–100.0)
Platelets: 278 10*3/uL (ref 150–400)
RBC: 3.94 MIL/uL (ref 3.87–5.11)
RDW: 13.6 % (ref 11.5–15.5)
WBC: 12.9 10*3/uL — ABNORMAL HIGH (ref 4.0–10.5)
nRBC: 0 % (ref 0.0–0.2)

## 2023-07-04 LAB — TYPE AND SCREEN
ABO/RH(D): O POS
Antibody Screen: NEGATIVE

## 2023-07-04 LAB — RPR: RPR Ser Ql: NONREACTIVE

## 2023-07-04 MED ORDER — PHENYLEPHRINE 80 MCG/ML (10ML) SYRINGE FOR IV PUSH (FOR BLOOD PRESSURE SUPPORT)
80.0000 ug | PREFILLED_SYRINGE | INTRAVENOUS | Status: DC | PRN
  Filled 2023-07-04: qty 10

## 2023-07-04 MED ORDER — OXYTOCIN-SODIUM CHLORIDE 30-0.9 UT/500ML-% IV SOLN
2.5000 [IU]/h | INTRAVENOUS | Status: DC
  Administered 2023-07-04: 2.5 [IU]/h via INTRAVENOUS

## 2023-07-04 MED ORDER — LACTATED RINGERS IV SOLN: INTRAVENOUS | Status: DC

## 2023-07-04 MED ORDER — ZOLPIDEM TARTRATE 5 MG PO TABS: 5.0000 mg | ORAL_TABLET | Freq: Every evening | ORAL | Status: DC | PRN

## 2023-07-04 MED ORDER — TERBUTALINE SULFATE 1 MG/ML IJ SOLN: 0.2500 mg | Freq: Once | INTRAMUSCULAR | Status: DC | PRN

## 2023-07-04 MED ORDER — PHENYLEPHRINE 80 MCG/ML (10ML) SYRINGE FOR IV PUSH (FOR BLOOD PRESSURE SUPPORT): 80.0000 ug | PREFILLED_SYRINGE | INTRAVENOUS | Status: DC | PRN

## 2023-07-04 MED ORDER — LIDOCAINE HCL (PF) 1 % IJ SOLN: 30.0000 mL | INTRAMUSCULAR | Status: DC | PRN

## 2023-07-04 MED ORDER — MISOPROSTOL 25 MCG QUARTER TABLET: 25.0000 ug | ORAL_TABLET | Freq: Once | ORAL | Status: DC

## 2023-07-04 MED ORDER — ACETAMINOPHEN 325 MG PO TABS: 650.0000 mg | ORAL_TABLET | ORAL | Status: DC | PRN

## 2023-07-04 MED ORDER — FENTANYL-BUPIVACAINE-NACL 0.5-0.125-0.9 MG/250ML-% EP SOLN
12.0000 mL/h | EPIDURAL | Status: DC | PRN
Start: 2023-07-04 — End: 2023-07-04
  Administered 2023-07-04: 12 mL/h via EPIDURAL
  Filled 2023-07-04: qty 250

## 2023-07-04 MED ORDER — ONDANSETRON HCL 4 MG PO TABS: 4.0000 mg | ORAL_TABLET | ORAL | Status: DC | PRN

## 2023-07-04 MED ORDER — SOD CITRATE-CITRIC ACID 500-334 MG/5ML PO SOLN: 30.0000 mL | ORAL | Status: DC | PRN

## 2023-07-04 MED ORDER — OXYTOCIN BOLUS FROM INFUSION
333.0000 mL | Freq: Once | INTRAVENOUS | Status: AC
  Administered 2023-07-04: 333 mL via INTRAVENOUS

## 2023-07-04 MED ORDER — PARAGARD INTRAUTERINE COPPER IU IUD
1.0000 | INTRAUTERINE_SYSTEM | Freq: Once | INTRAUTERINE | Status: AC
  Administered 2023-07-04: 1 via INTRAUTERINE
  Filled 2023-07-04: qty 1

## 2023-07-04 MED ORDER — EPHEDRINE 5 MG/ML INJ: 10.0000 mg | INTRAVENOUS | Status: DC | PRN

## 2023-07-04 MED ORDER — PRENATAL MULTIVITAMIN CH
1.0000 | ORAL_TABLET | Freq: Every day | ORAL | Status: DC
  Administered 2023-07-04 – 2023-07-06 (×2): 1 via ORAL
  Filled 2023-07-04 (×2): qty 1

## 2023-07-04 MED ORDER — DIBUCAINE (PERIANAL) 1 % EX OINT: 1.0000 | TOPICAL_OINTMENT | CUTANEOUS | Status: DC | PRN

## 2023-07-04 MED ORDER — IBUPROFEN 600 MG PO TABS
600.0000 mg | ORAL_TABLET | Freq: Four times a day (QID) | ORAL | Status: DC
  Administered 2023-07-04 – 2023-07-06 (×9): 600 mg via ORAL
  Filled 2023-07-04 (×9): qty 1

## 2023-07-04 MED ORDER — LACTATED RINGERS IV SOLN: 500.0000 mL | INTRAVENOUS | Status: DC | PRN

## 2023-07-04 MED ORDER — LACTATED RINGERS IV SOLN
500.0000 mL | Freq: Once | INTRAVENOUS | Status: AC
  Administered 2023-07-04: 500 mL via INTRAVENOUS

## 2023-07-04 MED ORDER — OXYTOCIN-SODIUM CHLORIDE 30-0.9 UT/500ML-% IV SOLN
1.0000 m[IU]/min | INTRAVENOUS | Status: DC
  Administered 2023-07-04: 2 m[IU]/min via INTRAVENOUS
  Filled 2023-07-04: qty 500

## 2023-07-04 MED ORDER — BENZOCAINE-MENTHOL 20-0.5 % EX AERO
1.0000 | INHALATION_SPRAY | CUTANEOUS | Status: DC | PRN
  Filled 2023-07-04: qty 56

## 2023-07-04 MED ORDER — HYDROXYZINE HCL 50 MG PO TABS: 50.0000 mg | ORAL_TABLET | Freq: Four times a day (QID) | ORAL | Status: DC | PRN

## 2023-07-04 MED ORDER — SENNOSIDES-DOCUSATE SODIUM 8.6-50 MG PO TABS
2.0000 | ORAL_TABLET | Freq: Every day | ORAL | Status: DC
  Administered 2023-07-06: 2 via ORAL
  Filled 2023-07-04: qty 2

## 2023-07-04 MED ORDER — MISOPROSTOL 50MCG HALF TABLET: 50.0000 ug | ORAL_TABLET | Freq: Once | ORAL | Status: DC

## 2023-07-04 MED ORDER — FLEET ENEMA RE ENEM: 1.0000 | ENEMA | Freq: Every day | RECTAL | Status: DC | PRN

## 2023-07-04 MED ORDER — LIDOCAINE HCL (PF) 1 % IJ SOLN
INTRAMUSCULAR | Status: DC | PRN
  Administered 2023-07-04 (×2): 4 mL via EPIDURAL

## 2023-07-04 MED ORDER — ONDANSETRON HCL 4 MG/2ML IJ SOLN: 4.0000 mg | Freq: Four times a day (QID) | INTRAMUSCULAR | Status: DC | PRN

## 2023-07-04 MED ORDER — OXYCODONE-ACETAMINOPHEN 5-325 MG PO TABS: 2.0000 | ORAL_TABLET | ORAL | Status: DC | PRN

## 2023-07-04 MED ORDER — OXYCODONE-ACETAMINOPHEN 5-325 MG PO TABS: 1.0000 | ORAL_TABLET | ORAL | Status: DC | PRN

## 2023-07-04 MED ORDER — ONDANSETRON HCL 4 MG/2ML IJ SOLN: 4.0000 mg | INTRAMUSCULAR | Status: DC | PRN

## 2023-07-04 MED ORDER — TETANUS-DIPHTH-ACELL PERTUSSIS 5-2.5-18.5 LF-MCG/0.5 IM SUSY: 0.5000 mL | PREFILLED_SYRINGE | Freq: Once | INTRAMUSCULAR | Status: DC

## 2023-07-04 MED ORDER — DIPHENHYDRAMINE HCL 50 MG/ML IJ SOLN: 12.5000 mg | INTRAMUSCULAR | Status: DC | PRN

## 2023-07-04 MED ORDER — COCONUT OIL OIL: 1.0000 | TOPICAL_OIL | Status: DC | PRN

## 2023-07-04 MED ORDER — FAMOTIDINE IN NACL 20-0.9 MG/50ML-% IV SOLN
20.0000 mg | Freq: Once | INTRAVENOUS | Status: AC
  Administered 2023-07-04: 20 mg via INTRAVENOUS
  Filled 2023-07-04: qty 50

## 2023-07-04 MED ORDER — ACETAMINOPHEN 325 MG PO TABS
650.0000 mg | ORAL_TABLET | ORAL | Status: DC | PRN
  Administered 2023-07-04 – 2023-07-06 (×5): 650 mg via ORAL
  Filled 2023-07-04 (×5): qty 2

## 2023-07-04 MED ORDER — FENTANYL CITRATE (PF) 100 MCG/2ML IJ SOLN
50.0000 ug | INTRAMUSCULAR | Status: DC | PRN
  Administered 2023-07-04: 50 ug via INTRAVENOUS
  Filled 2023-07-04: qty 2

## 2023-07-04 MED ORDER — WITCH HAZEL-GLYCERIN EX PADS: 1.0000 | MEDICATED_PAD | CUTANEOUS | Status: DC | PRN

## 2023-07-04 MED ORDER — SIMETHICONE 80 MG PO CHEW: 80.0000 mg | CHEWABLE_TABLET | ORAL | Status: DC | PRN

## 2023-07-04 MED ORDER — DIPHENHYDRAMINE HCL 25 MG PO CAPS: 25.0000 mg | ORAL_CAPSULE | Freq: Four times a day (QID) | ORAL | Status: DC | PRN

## 2023-07-04 NOTE — Anesthesia Preprocedure Evaluation (Signed)
Anesthesia Evaluation  Patient identified by MRN, date of birth, ID band Patient awake    Reviewed: Allergy & Precautions, Patient's Chart, lab work & pertinent test results  History of Anesthesia Complications Negative for: history of anesthetic complications  Airway Mallampati: II  TM Distance: >3 FB Neck ROM: Full    Dental no notable dental hx.    Pulmonary neg pulmonary ROS   Pulmonary exam normal        Cardiovascular negative cardio ROS Normal cardiovascular exam     Neuro/Psych  Headaches    GI/Hepatic negative GI ROS, Neg liver ROS,,,  Endo/Other  negative endocrine ROS    Renal/GU negative Renal ROS  negative genitourinary   Musculoskeletal negative musculoskeletal ROS (+)    Abdominal   Peds  Hematology negative hematology ROS (+)   Anesthesia Other Findings Day of surgery medications reviewed with patient.  Reproductive/Obstetrics (+) Pregnancy                             Anesthesia Physical Anesthesia Plan  ASA: 2  Anesthesia Plan: Epidural   Post-op Pain Management:    Induction:   PONV Risk Score and Plan: Treatment may vary due to age or medical condition  Airway Management Planned: Natural Airway  Additional Equipment: Fetal Monitoring  Intra-op Plan:   Post-operative Plan:   Informed Consent: I have reviewed the patients History and Physical, chart, labs and discussed the procedure including the risks, benefits and alternatives for the proposed anesthesia with the patient or authorized representative who has indicated his/her understanding and acceptance.       Plan Discussed with:   Anesthesia Plan Comments:        Anesthesia Quick Evaluation

## 2023-07-04 NOTE — Progress Notes (Addendum)
LABOR PROGRESS NOTE  Robin Strickland is a 23 y.o. G3P2002 at [redacted]w[redacted]d admitted for IOL for A1GDM.  Subjective: Patient is laboring well, agreeable to AROM at this time.  Objective: BP 116/73 (BP Location: Right Arm)   Pulse (!) 109   Temp 97.8 F (36.6 C) (Oral)   Resp 16   Ht 5\' 1"  (1.549 m)   Wt 91.5 kg   LMP 09/15/2022   BMI 38.11 kg/m  or  Vitals:   07/04/23 0003 07/04/23 0014 07/04/23 0027  BP:  116/73   Pulse:  (!) 109   Resp:  16   Temp:   97.8 F (36.6 C)  TempSrc:   Oral  Weight: 91.5 kg    Height: 5\' 1"  (1.549 m)     Dilation: 4 Effacement (%): 60 Cervical Position: Middle Station: -2 Presentation: Vertex Exam by:: Dr Nani Gasser Baseline: 140 bpm Variability: Good (> 6 bpm) Accelerations: Reactive Decelerations: Absent Uterine activity: Frequency: Every 5 minutes Cat: I  Labs: Lab Results  Component Value Date   WBC 12.9 (H) 07/04/2023   HGB 12.0 07/04/2023   HCT 36.3 07/04/2023   MCV 92.1 07/04/2023   PLT 278 07/04/2023    Patient Active Problem List   Diagnosis Date Noted   Diet controlled gestational diabetes mellitus (GDM) in third trimester 07/04/2023   Gestational diabetes 04/14/2023   Obesity in pregnancy 03/23/2023   BMI 34.0-34.9,adult 03/23/2023   Supervision of high-risk pregnancy 12/29/2022   Iron deficiency anemia of mother during pregnancy 02/17/2016    Assessment / Plan: 23 y.o. G3P2002 at [redacted]w[redacted]d here for IOL for A1GDM.  Labor: CVE 4/60%/-2 s/p FB, attempted AROM x3 without success and patient unable to tolerate further attempts, thin membranes still intact, started on low dose Pitocin, if not SROM by next check, plan to attempt AROM again Fetal Wellbeing: Cat 1 Pain Control:  well-controlled at this time, epidural available on request GBS: negative Anticipated MOD: VD  #A1GDM: diet-controlled, home fasting glucoses 70-90s and postprandial 120-130s, glucose on admission 117, trend CBG q4h - last glucose  131  #IDA: s/p IV iron x3, pending Hgb on admission, no dizziness at this time, plan to repeat CBC postpartum and monitor for symptoms  Robin Strickland, Medical Student  07/04/2023, 3:34 AM   Evaluation and management procedures were performed by the MS3 under my supervision. I was immediately available for direct supervision, assistance and direction throughout this encounter.  I also confirm that I have verified the information documented in the student's note, and that I have also personally reperformed the pertinent components of the physical exam and all of the medical decision making activities.  I have also made any necessary editorial changes.   Mittie Bodo, MD Family Medicine - Obstetrics Fellow

## 2023-07-04 NOTE — Lactation Note (Signed)
This note was copied from a baby's chart. Lactation Consultation Note  Patient Name: Robin Strickland [id number] Date: 07/04/2023 Age:24 hours Reason for consult: Initial assessment;Primapara;1st time breastfeeding;Term  P, 39 wks, @ 7 hrs of life. Mom requests LC- normal first day questions- is baby getting enough. Discussed babies progression at breast- day 1 sleepy, day 2 more feeding cues/awake, with cluster feeding overnights to bring in milk supply. Baby due to et- mom receptive to putting to breast. Demonstrated starting with hand expression, mom has excellent- running colostrum, demonstrated steps of latching. Baby responds beautifully- 16 minutes in cross cradle on right breast. Swallows heard. Encouraged mom to taking over holds, praised couplet is doing perfect. Discussed milk coming in expectations, encouraged working on big mouth latch at breast. LC services and milk storage handouts shared with mom- [employer] @ home. Encouraged mom to call anytime assist desired.  Maternal Data Has patient been taught Hand Expression?: Yes Does the patient have breastfeeding experience prior to this delivery?: No  Feeding Mother's Current Feeding Choice: Breast Milk  LATCH Score Latch: Grasps breast easily, tongue down, lips flanged, rhythmical sucking.  Audible Swallowing: Spontaneous and intermittent  Type of Nipple: Everted at rest and after stimulation  Comfort (Breast/Nipple): Soft / non-tender  Hold (Positioning): Assistance needed to correctly position infant at breast and maintain latch.  LATCH Score: 9   Lactation Tools Discussed/Used    Interventions Interventions: Breast feeding basics reviewed;Assisted with latch;Hand express;Breast compression;Education;LC Services brochure (Milk Storage Guidelines)  Discharge Pump: DEBP;Personal ([employer] @ home)  Consult Status Consult Status: Follow-up Date: 07/05/23 Follow-up type: In-patient    [facility] 07/04/2023, 5:06 PM

## 2023-07-04 NOTE — Discharge Summary (Signed)
Postpartum Discharge Summary  Date of Service updated***     Patient Name: Robin Strickland DOB: 04-Nov-2000 MRN: [id number]  Date of admission: 07/04/2023 Delivery date:07/04/2023 Delivering provider: Levie Heritage Date of discharge: 07/04/2023  Admitting diagnosis: Diet controlled gestational diabetes mellitus (GDM) in third trimester [O24.410] Intrauterine pregnancy: [redacted]w[redacted]d     Secondary diagnosis:  Principal Problem:   Diet controlled gestational diabetes mellitus (GDM) in third trimester  Additional problems: ***    Discharge diagnosis: Term Pregnancy Delivered and GDM A1                                              Post partum procedures:{Postpartum procedures:23558} Augmentation: Pitocin and IP Foley Complications: None  Hospital course: Induction of Labor With Vaginal Delivery   23 y.o. yo G3P2002 at [redacted]w[redacted]d was admitted to the hospital 07/04/2023 for induction of labor.  Indication for induction: A1 DM.  Patient had an labor course that was uncomplicated. Membrane Rupture Time/Date: 7:48 AM,07/04/2023  Delivery Method:Vaginal, Spontaneous Operative Delivery:N/A Episiotomy: None Lacerations:  None Details of delivery can be found in separate delivery note.  Patient had a postpartum course complicated by***. Patient is discharged home 07/04/23.  Newborn Data: Birth date:07/04/2023 Birth time:9:59 AM Gender:Female Living status:Living Apgars:9 ,9  Weight:   Magnesium Sulfate received: {Mag received:30440022} BMZ received: No Rhophylac:No MMR:{MMR:30440033} T-DaP:Given prenatally Flu: No RSV Vaccine received: Yes Transfusion:No Immunizations administered: Immunization History  Administered Date(s) Administered   Influenza,inj,Quad PF,6+ Mos 02/16/2016, 06/27/2019   Rsv, Bivalent, Protein Subunit Rsvpref,pf Verdis Frederickson) 06/16/2023   Tdap 03/31/2016, 11/20/2019, 04/13/2023    Physical exam  Vitals:   07/04/23 0933 07/04/23 1002 07/04/23 1014 07/04/23  1016  BP: 114/68 (!) 132/102 (!) 119/56 (!) 125/58  Pulse: 97 (!) 109 (!) 104 (!) 101  Resp:      Temp:      TempSrc:      SpO2:      Weight:      Height:       General: {Exam; general:21111117} Lochia: {Desc; appropriate/inappropriate:30686::"appropriate"} Uterine Fundus: {Desc; firm/soft:30687} Incision: {Exam; incision:21111123} DVT Evaluation: {Exam; dvt:2111122} Labs: Lab Results  Component Value Date   WBC 12.9 (H) 07/04/2023   HGB 12.0 07/04/2023   HCT 36.3 07/04/2023   MCV 92.1 07/04/2023   PLT 278 07/04/2023      Latest Ref Rng & Units 07/04/2023   12:23 AM  CMP  Glucose 70 - 99 mg/dL 295   BUN 6 - 20 mg/dL 11   Creatinine 6.21 - 1.00 mg/dL 3.08   Sodium 657 - 846 mmol/L 131   Potassium 3.5 - 5.1 mmol/L 4.0   Chloride 98 - 111 mmol/L 102   CO2 22 - 32 mmol/L 18   Calcium 8.9 - 10.3 mg/dL 9.1   Total Protein 6.5 - 8.1 g/dL 7.4   Total Bilirubin 0.0 - 1.2 mg/dL 0.6   Alkaline Phos 38 - 126 U/L 115   AST 15 - 41 U/L 18   ALT 0 - 44 U/L 13    Edinburgh Score:    03/13/2020    8:19 AM  Edinburgh Postnatal Depression Scale Screening Tool  I have been able to laugh and see the funny side of things. 0  I have looked forward with enjoyment to things. 0  I have blamed myself unnecessarily when things went wrong. 0  I  have been anxious or worried for no good reason. 0  I have felt scared or panicky for no good reason. 2  Things have been getting on top of me. 1  I have been so unhappy that I have had difficulty sleeping. 0  I have felt sad or miserable. 0  I have been so unhappy that I have been crying. 0  The thought of harming myself has occurred to me. 0  Edinburgh Postnatal Depression Scale Total 3      After visit meds:  Allergies as of 07/04/2023   No Known Allergies   Med Rec must be completed prior to using this [facility]***        Discharge home in stable condition Infant Feeding: Breast Infant Disposition:home with mother Discharge  instruction: per After Visit Summary and Postpartum booklet. Activity: Advance as tolerated. Pelvic rest for 6 weeks.  Diet: routine diet Anticipated Birth Control: PP IUD placed Postpartum Appointment:6 weeks Additional Postpartum F/U: 2 hour GTT Future Appointments:No future appointments. Follow up Visit:      07/04/2023 Margie Billet, MD

## 2023-07-04 NOTE — Anesthesia Procedure Notes (Signed)
Epidural Patient location during procedure: OB Start time: 07/04/2023 5:44 AM End time: 07/04/2023 5:47 AM  Staffing Anesthesiologist: Kaylyn Layer, MD Performed: anesthesiologist   Preanesthetic Checklist Completed: patient identified, IV checked, risks and benefits discussed, monitors and equipment checked, pre-op evaluation and timeout performed  Epidural Patient position: sitting Prep: DuraPrep and site prepped and draped Patient monitoring: continuous pulse ox, blood pressure and heart rate Approach: midline Location: L3-L4 Injection technique: LOR air  Needle:  Needle type: Tuohy  Needle gauge: 17 G Needle length: 9 cm Needle insertion depth: 5 cm Catheter type: closed end flexible Catheter size: 19 Gauge Catheter at skin depth: 10 cm Test dose: negative and Other (1% lidocaine)  Assessment Events: blood not aspirated, no cerebrospinal fluid, injection not painful, no injection resistance, no paresthesia and negative IV test  Additional Notes Patient identified. Risks, benefits, and alternatives discussed with patient including but not limited to bleeding, infection, nerve damage, paralysis, failed block, incomplete pain control, headache, blood pressure changes, nausea, vomiting, reactions to medication, itching, and postpartum back pain. Confirmed with bedside nurse the patient's most recent platelet count. Confirmed with patient that they are not currently taking any anticoagulation, have any bleeding history, or any family history of bleeding disorders. Patient expressed understanding and wished to proceed. All questions were answered. Sterile technique was used throughout the entire procedure. Please see nursing notes for vital signs.   Crisp LOR on first pass. Test dose was given through epidural catheter and negative prior to continuing to dose epidural or start infusion. Warning signs of high block given to the patient including shortness of breath,  tingling/numbness in hands, complete motor block, or any concerning symptoms with instructions to call for help. Patient was given instructions on fall risk and not to get out of bed. All questions and concerns addressed with instructions to call with any issues or inadequate analgesia.  Reason for block:procedure for pain

## 2023-07-04 NOTE — Procedures (Signed)
  Post-Placental IUD Insertion Procedure Note  Patient identified, informed consent signed prior to delivery, signed copy in chart, time out was performed.    Vaginal, labial and perineal areas thoroughly inspected for lacerations. No laceration identified.   Paragard IUD grasped with sterile Kelly forceps. Fundus identified through abdominal wall using non-insertion hand. IUD inserted to fundus with ring forceps. IUD carefully released at the fundus and kelly forceps gently removed from vagina.    Strings trimmed to the level of the introitus. Patient tolerated procedure well.  Lot # [id number]  Expiration Date: 10/04/28  Patient given post procedure instructions and IUD care card with expiration date.  Patient is asked to keep IUD strings tucked in her vagina until her postpartum follow up visit in 4-6 weeks. Patient advised to abstain from sexual intercourse and pulling on strings before her follow-up visit. Patient verbalized an understanding of the plan of care and agrees.

## 2023-07-04 NOTE — Progress Notes (Signed)
Labor Progress Note Robin Strickland is a 23 y.o. G3P2002 at [redacted]w[redacted]d presented for IOL due to A1GDM.  S: No concerns at this time, resting comfortably.  O:  BP (!) 108/59   Pulse 89   Temp 98 F (36.7 C) (Oral)   Resp 16   Ht 5\' 1"  (1.549 m)   Wt 91.5 kg   LMP 09/15/2022   SpO2 100%   BMI 38.11 kg/m  EFM: 120/mod/no a/+variables  CVE: Dilation: 6 Effacement (%): 80 Cervical Position: Middle Station: -2 Presentation: Vertex Exam by:: lee   A&P: 23 y.o. [id number] [redacted]w[redacted]d here for IOL due to A1GDM. #Labor: Progressing well. Contractions difficult to trace, but appear regular based on tracing, consider IUPC if unable to ascertani ctx pattern or variables continue despite maternal repositioning. #Pain: Epidural #FWB: Cat II, overall reassuring #GBS negative  #A1GDM: CBG wnl, cont q4h BG checks for now, then will start q2h once in active labor  EFW 3030g, 50%tile w AC 84%tile at [redacted]w[redacted]d  will need PP 2h GTT  #IDA: HgB 12.0 on admit  #BMI 38  [name], MD 9:28 AM

## 2023-07-04 NOTE — H&P (Addendum)
OBSTETRIC ADMISSION HISTORY AND PHYSICAL  Robin Strickland is a 23 y.o. female G3P2002 with IUP at [redacted]w[redacted]d by [place] presenting for IOL for A1GDM. She reports +FMs, no LOF, no VB, no blurry vision, headaches, peripheral edema, or RUQ pain.  She plans on breastfeeding. She request post-placental Paraguard for birth control. She received her prenatal care at [facility]   Dating: By [place] --->  Estimated Date of Delivery: 07/11/23  Sono:    Growth: @[redacted]w[redacted]d , CWD, normal anatomy, cephalic presentation, posterior placenta, unknown lie, 3030 g, 50% EFW  Prenatal History/Complications: A1GDM, IDA s/p IV iron x3, BV during pregnancy s/p miconazole  Past Medical History: Past Medical History:  Diagnosis Date   Gestational diabetes    During 1st pregnancy.   Headache    History of gestational diabetes 04/19/2016   2021: early a1c negative.    Migraine without aura and without status migrainosus, not intractable 10/28/2017   UTI (urinary tract infection) during pregnancy 07/02/2019   [ ]  needs toc: neg   Past Surgical History: Past Surgical History:  Procedure Laterality Date   NO PAST SURGERIES     Obstetrical History: OB History     Gravida  3   Para  2   Term  2   Preterm      AB      Living  2      SAB      IAB      Ectopic      Multiple  0   Live Births  2          Social History Social History   Socioeconomic History   Marital status: Single    Spouse name: Not on file   Number of children: 2   Years of education: 12   Highest education level: High school graduate  Occupational History   Occupation: Associate Professor  Tobacco Use   Smoking status: Never   Smokeless tobacco: Never  Vaping Use   Vaping status: Never Used  Substance and Sexual Activity   Alcohol use: No   Drug use: No   Sexual activity: Yes  Other Topics Concern   Not on file  Social History Narrative   Lives at home with family.   Right-handed.   No daily use of caffeine,  occasional iced coffee or soda.   Social Drivers of Corporate investment banker Strain: Not on file  Food Insecurity: No Food Insecurity (07/04/2023)   Hunger Vital Sign    Worried About Running Out of Food in the Last Year: Never true    Ran Out of Food in the Last Year: Never true  Transportation Needs: No Transportation Needs (07/04/2023)   PRAPARE - Administrator, Civil Service (Medical): No    Lack of Transportation (Non-Medical): No  Physical Activity: Not on file  Stress: Not on file  Social Connections: Not on file    Family History: Family History  Problem Relation Age of Onset   Diabetes Mother    Hypertension Mother    Kidney disease Mother    Other Father        unsure of medical history   Diabetes Maternal Grandmother    Hypertension Maternal Grandmother    Kidney disease Maternal Grandmother    Asthma Neg Hx    Cancer Neg Hx    Heart disease Neg Hx     Allergies: No Known Allergies  Medications Prior to Admission  Medication Sig Dispense Refill Last  Dose/Taking   Accu-Chek Softclix Lancets lancets 1 each by Other route 4 (four) times daily. 100 each 12    acetaminophen (TYLENOL) 325 MG tablet Take 2 tablets (650 mg total) by mouth every 6 (six) hours as needed for mild pain (pain score 1-3). (Patient not taking: Reported on 06/29/2023) 30 tablet 0    Blood Glucose Monitoring Suppl (ACCU-CHEK GUIDE) w/Device KIT 1 Device by Does not apply route 4 (four) times daily. 1 kit 0    cyclobenzaprine (FLEXERIL) 10 MG tablet Take 1 tablet (10 mg total) by mouth every 8 (eight) hours as needed for muscle spasms. (Patient not taking: Reported on 06/29/2023) 30 tablet 1    diphenhydrAMINE (BENADRYL) 25 MG tablet Take 1 tablet (25 mg total) by mouth every 6 (six) hours as needed. (Patient not taking: Reported on 06/29/2023)      ferrous sulfate 325 (65 FE) MG EC tablet Take 1 tablet (325 mg total) by mouth every other day. 45 tablet 1    glucose blood (ACCU-CHEK  GUIDE) test strip Use to check blood sugars four times a day was instructed 50 each 12    Prenatal Vit-Fe Fumarate-FA (MULTIVITAMIN-PRENATAL) 27-0.8 MG TABS tablet Take 1 tablet by mouth daily at 12 noon.      Review of Systems   All systems reviewed and negative except as stated in HPI  Temperature 97.8 F (36.6 C), temperature source Oral, height 5\' 1"  (1.549 m), weight 91.5 kg, last menstrual period 09/15/2022, currently breastfeeding. General appearance: alert, cooperative, appears stated age, and no distress Lungs: normal work of breathing, no respiratory distress Heart: regular rate Abdomen: soft, non-tender; bowel sounds normal Extremities: bilateral lower extremity edema Presentation: cephalic Fetal monitoring Baseline: 140 bpm, Variability: Good {> 6 bpm), Accelerations: Reactive, and Decelerations: Absent Uterine activity Frequency: Every 1-5 minutes Dilation: 2.5 Effacement (%): Thick Station: -3, -2 Exam by:: Dr Judd Lien Prenatal labs: ABO, Rh: --/--/O POS (01/27 0022) Antibody: NEG (01/27 0022) Rubella: 1.30 (07/24 1556) RPR: Non Reactive (11/06 0948)  HBsAg: Negative (07/24 1556)  HIV: Non Reactive (11/06 0948)  GBS: Negative/-- (01/09 0924)    Lab Results  Component Value Date   GBS Negative 06/16/2023   GTT 1hr abnormal at 194 Genetic screening negative Anatomy US normal  Immunization History  Administered Date(s) Administered   Influenza,inj,Quad PF,6+ Mos 02/16/2016, 06/27/2019   Rsv, Bivalent, Protein Subunit Rsvpref,pf Verdis Frederickson) 06/16/2023   Tdap 03/31/2016, 11/20/2019, 04/13/2023   Prenatal Transfer Tool  Maternal Diabetes: Yes:  Diabetes Type:  Diet controlled Genetic Screening: Normal Maternal Ultrasounds/Referrals: Normal Fetal Ultrasounds or other Referrals:  Referred to Materal Fetal Medicine  Maternal Substance Abuse:  No Significant Maternal Medications:  None Significant Maternal Lab Results: Group B Strep negative Number of Prenatal  Visits:greater than 3 verified prenatal visits Maternal Vaccinations:RSV: Given during pregnancy >/=14 days ago and TDap Other Comments:  None  Results for orders placed or performed during the hospital encounter of 07/04/23 (from the past 24 hours)  Glucose, capillary   Collection Time: 07/04/23 12:18 AM  Result Value Ref Range   Glucose-Capillary 117 (H) 70 - 99 mg/dL  Type and screen   Collection Time: 07/04/23 12:22 AM  Result Value Ref Range   ABO/RH(D) O POS    Antibody Screen NEG    Sample Expiration      07/07/2023,2359 Performed at [facility], [address], [place], [place] [address]     Patient Active Problem List   Diagnosis Date Noted  Diet controlled gestational diabetes mellitus (GDM) in third trimester 07/04/2023   Gestational diabetes 04/14/2023   Obesity in pregnancy 03/23/2023   BMI 34.0-34.9,adult 03/23/2023   Supervision of high-risk pregnancy 12/29/2022   Iron deficiency anemia of mother during pregnancy 02/17/2016   Assessment/Plan:  Robin Strickland is a 23 y.o. G3P2002 at [redacted]w[redacted]d here for IOL for A1GDM  #Labor: CVE 2.5/-3,-2, placed FB, could consider Cytotec vs Pit if needed, plan for AROM once FB out  #Pain: well-controlled at this time, epidural available on request #FWB: Cat 1 #A1GDM: diet-controlled, home fasting glucoses 70-90s and postprandial 120-130s, glucose on admission 117, trend CBG q4h #IDA: s/p IV iron x3, pending Hgb on admission, no dizziness at this time, plan to repeat CBC postpartum and monitor for symptoms #BV during pregnancy: s/p miconazole, currently asymptomatic #GBS status: negative #Feeding: Breastmilk  #Reproductive Life planning: IUD Paraguard  patient would like to have it placed post-placental, counseled on expulsion rates for postplacental vs postpartum placement if VD #Circ: female yes, currently interested  [name], Medical Student  07/04/2023, 1:13 AM   Evaluation and management procedures  were performed by the MS3 under my supervision. I was immediately available for direct supervision, assistance and direction throughout this encounter.  I also confirm that I have verified the information documented in the student's note, and that I have also personally reperformed the pertinent components of the physical exam and all of the medical decision making activities.  I have also made any necessary editorial changes.   Mittie Bodo, MD Family Medicine - Obstetrics Fellow

## 2023-07-05 LAB — BIRTH TISSUE RECOVERY COLLECTION (PLACENTA DONATION)

## 2023-07-05 MED ORDER — IBUPROFEN 600 MG PO TABS: 600.0000 mg | ORAL_TABLET | Freq: Four times a day (QID) | ORAL | 0 refills | Status: DC

## 2023-07-05 NOTE — Lactation Note (Signed)
This note was copied from a baby's chart. Lactation Consultation Note  Patient Name: Robin Strickland [id number] Date: 07/05/2023 Age:24 hours  Attempted to see mom but she was sleeping.    Maternal Data    Feeding Nipple Type: Slow - flow  LATCH Score                    Lactation Tools Discussed/Used    Interventions    Discharge    Consult Status      Alene Bergerson, Diamond Nickel 07/05/2023, 1:41 AM

## 2023-07-05 NOTE — Progress Notes (Signed)
POSTPARTUM PROGRESS NOTE  Post Partum Day 1 Subjective:  Robin Strickland is a 23 y.o. G3P3003 [redacted]w[redacted]d s/p nsvd, doing well.  No acute events overnight.  Pt denies problems with ambulating, voiding or po intake.  She denies nausea or vomiting.  Pain is well controlled.  She has had flatus. She has not had bowel movement.  Lochia Small.   Objective: Blood pressure (!) 103/58, pulse 90, temperature 98 F (36.7 C), resp. rate 18, height 5\' 1"  (1.549 m), weight 91.5 kg, last menstrual period 09/15/2022, SpO2 99%, unknown if currently breastfeeding.  Physical Exam:  General: alert, cooperative and no distress Lochia:normal flow Chest: CTAB Heart: RRR no m/r/g Abdomen: +BS, soft, nontender,  Uterine Fundus: firm,   DVT Evaluation: No calf swelling or tenderness Extremities: no edema  Recent Labs    07/04/23 0227  HGB 12.0  HCT 36.3    Assessment/Plan:  ASSESSMENT: Robin Strickland is a 23 y.o. G3P3003 [redacted]w[redacted]d s/p nsvd, doing well. Consented for circ today. Breastfeeding. Pp iud (copper) placed. Plan was d/c today and discharge orders were placed, but now baby needs to stay and mom elects to as well. Given a1gdm will order fasting glucose for tomorrow.  Plan for discharge tomorrow   LOS: 1 day   Silvano Bilis 07/05/2023, 1:50 PM

## 2023-07-05 NOTE — Anesthesia Postprocedure Evaluation (Signed)
Anesthesia Post Note  Patient: Robin Strickland  Procedure(s) Performed: AN AD HOC LABOR EPIDURAL     Patient location during evaluation: Mother Baby Anesthesia Type: Epidural Level of consciousness: awake, oriented and awake and alert Pain management: pain level controlled Vital Signs Assessment: post-procedure vital signs reviewed and stable Respiratory status: spontaneous breathing, respiratory function stable and nonlabored ventilation Cardiovascular status: stable Postop Assessment: no headache, adequate PO intake, able to ambulate, patient able to bend at knees and no apparent nausea or vomiting Anesthetic complications: no   No notable events documented.  Last Vitals:  Vitals:   07/05/23 0033 07/05/23 0614  BP: 97/61 (!) 103/58  Pulse: 85 90  Resp: 18 18  Temp: 36.7 C   SpO2: 99% 99%    Last Pain:  Vitals:   07/05/23 0715  TempSrc:   PainSc: 2    Pain Goal: Patients Stated Pain Goal: Other (Comment) (discussed at length options for pain management while at bedside, education provided on increased speed of subsequent labors, that sometimes labor can speed up without time to get an epidural, that an epidural needs time to set up for good effect, etc) (07/04/23 0400)                 Keeghan Mcintire

## 2023-07-05 NOTE — Lactation Note (Signed)
This note was copied from a baby's chart. Lactation Consultation Note  Patient Name: Robin Strickland [id number] Date: 07/05/2023 Age:24 hours Reason for consult: Follow-up assessment;Maternal discharge;Term  P3 mom of 69 hour old infant consulted for follow up and possible discharge education. Mom reports feeling that baby breastfeeds better than when offered the bottle. Mom asked for review of latch and positioning as it sometimes is challenging to get baby to latch, but he then does well with eating after the initial latch-on. LC reviewed latch and positioning using arms/hand to demonstrate nutritive vs non-nutritive sucking. LC discussed the importance of optimal positioning and reviewed signs of satiation. Provided guidance on lactation support once home via [facility] services brochure.   Mom asked for a manual pump. LC assessed nipple size and provided size 18mm flanges with the manual pump. Also provided 21mm in case of nipple expansion. LC recommended to continue to feed baby on demand and call out for lactation support as needed.   Maternal Data    Feeding Mother's Current Feeding Choice: Breast Milk and Formula  LATCH Score Infant asleep; returned from circumcision procedure  Lactation Tools Discussed/Used    Interventions Interventions: Breast feeding basics reviewed;Education;LC Services brochure;Hand pump;CDC Guidelines for Breast Pump Cleaning;Guidelines for Milk Supply and Pumping Schedule Handout  Discharge Discharge Education: Engorgement and breast care;Outpatient recommendation;Warning signs for feeding baby Pump: Personal;Manual  Consult Status Consult Status: Complete Date: 07/05/23    [name] 07/05/2023, 10:50 AM

## 2023-07-06 ENCOUNTER — Encounter: Payer: Medicaid Other | Admitting: Family Medicine

## 2023-07-06 LAB — GLUCOSE, RANDOM: Glucose, Bld: 98 mg/dL (ref 70–99)

## 2023-07-06 NOTE — Lactation Note (Addendum)
This note was copied from a baby's chart. Lactation Consultation Note  Patient Name: Robin Strickland [id number] Date: 07/06/2023 Age:24 hours  Reason for consult: Infant weight loss;Follow-up assessment;Term  P3, [redacted]w[redacted]d, 10% weight loss  Mother reports her milk is coming in and breast are feeling full. She has been formula feeding due to infant's weight loss and she is considering formula feeding only "because he is doing well with it."  Discussed pumping as an option. Mother has breast fed and pumped with her other babies for 4-5 months. She reports she had to use a nipple shield for the first 1-2 weeks with breastfeeding her previous babies. Mother receptive and wanting to use a NS again. Mother was fitted with a 16 mm NS and baby latched and suckled.  Baby had just taken 33 ml of formula prior to my arrival. The NS was pre-loaded with formula prior to baby latching. With milk in the shield baby latched well and suckled. Mother has experience and understanding of using a NS, observing for a deep latch, observing milk in the NS, pumping as needed and weight gain. Mother will also continue to feed baby expressed milk and/or formula after breastfeeding until steady weight gain.   Discussed OP services and non-nursing engorgement if mother decides to only formula feed.    Feeding Mother's Current Feeding Choice: Breast Milk and Formula Nipple Type: Nfant Standard Flow (white)  LATCH Score Latch: Grasps breast easily, tongue down, lips flanged, rhythmical sucking. (with NS applied)  Audible Swallowing: A few with stimulation  Type of Nipple: Everted at rest and after stimulation  Comfort (Breast/Nipple): Filling, red/small blisters or bruises, mild/mod discomfort  Hold (Positioning): No assistance needed to correctly position infant at breast.  LATCH Score: 8   Lactation Tools Discussed/Used Tools: Nipple Shields Nipple shield size: 16  Interventions Interventions:  Education;Support pillows;Breast compression;Breast feeding basics reviewed  Discharge Discharge Education: Engorgement and breast care;Warning signs for feeding baby;Outpatient recommendation Pump: Personal;Manual  Consult Status Consult Status: Complete Date: 07/06/23    Omar Person 07/06/2023, 1:08 PM

## 2023-07-07 ENCOUNTER — Encounter: Payer: Medicaid Other | Admitting: Obstetrics & Gynecology

## 2023-07-12 ENCOUNTER — Telehealth (HOSPITAL_COMMUNITY): Payer: Self-pay

## 2023-07-12 NOTE — Telephone Encounter (Signed)
 07/12/2023 1912  Name: Robin Strickland MRN: [id number] DOB: 08/23/2000  Reason for Call:  Transition of Care Hospital Discharge Call  Contact Status: Patient Contact Status: Complete  Language assistant needed:          Follow-Up Questions: Do You Have Any Concerns About Your Health As You Heal From Delivery?: Yes What Concerns Do You Have About Your Health?: Patient states that she has been having a bad migraine. She reports that it is constant and everyday. RN reviewed preeclampsia and warning signs. She states that she has noticed that her hands are swollen and she is nauseous with the headache. She also reports lower back pain that hurts more with standing. Patient has not checked her BP. RN told patient about [facility] and recommended that she be evaulauted. Patient verbalizes understanding and states she will come to MAU. Do You Have Any Concerns About Your Infants Health?: No (Patient reports baby is doing well and has gained past his birthweight. Patient declines any questions or concerns about baby.)  Edinburgh Postnatal Depression Scale:  In the Past 7 Days: I have been able to laugh and see the funny side of things.: As much as I always could I have looked forward with enjoyment to things.: As much as I ever did I have blamed myself unnecessarily when things went wrong.: No, never I have been anxious or worried for no good reason.: No, not at all I have felt scared or panicky for no good reason.: No, not at all Things have been getting on top of me.: No, I have been coping as well as ever I have been so unhappy that I have had difficulty sleeping.: Not at all I have felt sad or miserable.: No, not at all I have been so unhappy that I have been crying.: No, never The thought of harming myself has occurred to me.: Never Van Postnatal Depression Scale Total: 0  PHQ2-9 Depression Scale:     Discharge Follow-up: Edinburgh score requires follow up?:  No Patient was advised of the following resources:: Breastfeeding Support Group, Support Group  Post-discharge interventions: Reviewed Newborn Safe Sleep Practices  Signature  Rosaline Deretha PEAK

## 2023-07-19 ENCOUNTER — Encounter: Payer: Self-pay | Admitting: Family Medicine

## 2023-08-15 ENCOUNTER — Telehealth: Payer: Self-pay

## 2023-08-15 NOTE — Telephone Encounter (Signed)
 Left message for pt to call office back regarding lab appt on 3/12

## 2023-08-17 ENCOUNTER — Ambulatory Visit: Payer: Medicaid Other | Admitting: Obstetrics and Gynecology

## 2023-08-17 ENCOUNTER — Other Ambulatory Visit: Payer: Medicaid Other

## 2023-08-23 ENCOUNTER — Ambulatory Visit: Admitting: Obstetrics & Gynecology

## 2023-08-23 ENCOUNTER — Other Ambulatory Visit

## 2023-08-23 DIAGNOSIS — Z975 Presence of (intrauterine) contraceptive device: Secondary | ICD-10-CM | POA: Insufficient documentation

## 2024-02-08 ENCOUNTER — Ambulatory Visit

## 2024-03-07 DIAGNOSIS — N200 Calculus of kidney: Secondary | ICD-10-CM

## 2024-03-07 HISTORY — DX: Calculus of kidney: N20.0

## 2024-03-14 NOTE — Progress Notes (Signed)
 ------------------------------------------------------------------------------- Summary: Comp Exam -------------------------------------------------------------------------------  [facility] 03/14/24 No primary care provider on file. Treatment Providers Christopher Cave, DDS   Dental procedures in this visit  . D0150 - COMPREHENSIVE ORAL EVALUATION - NEW OR ESTABLISHED PATIENT  . I9725 - BITEWINGS - FOUR RADIOGRAPHIC IMAGES  . I9779 - SINGLE X-RAY  . I9769 - ADDITIONAL X-RAY  . I0000 - PERIO PROBING  . I9769 - ADDITIONAL X-RAY  . I9769 - ADDITIONAL X-RAY    HEALTH HISTORY ? Vitals:  BP Readings from Last 1 Encounters:  03/14/24 95/65    Pulse:  78 Medical history was reviewed and updated. No contraindication to care. Medical History[1] Surgical History[2] Social History[3] Family History[4] Medications Ordered Prior to Encounter[5] Medical Risk Assessment ASA GRADE: ASA 1 - Normal health patient  Subjective: Ms. Bonni Deward Cory is a 23 y.o. female who presents for initial consultation with the chief concern (CC): Pt is having some sensitivity on lower right side , Pt also recently started having  a metal taste on left side, , Gums also started to hurt a couple days ago. Pt is interested in closing gap between 8 and 9 stated its just been getting bigger and lip get caught on it. Would like a consult with Ortho  History of presenting condition: last cleaning was 3 years ago  ?Pain score currently:  0/10  Objective:  Radiographs taken: 4 PAs and 2-4 BWs:    All Radiographs Entry:            ROS: all other systems negative for symptoms Extraoral Examination? Findings: WNL Patient is well appearing, alert and oriented to time and place.  Patient appeared well nourished and in no distress.  There was no facial asymmetry, swelling or lymphadenopathy.  There was no hoarseness.  The temporomandibular joint examination was within normal limits  without tenderness.  Palpation of the masseter, temporalis, sternocleidomastoid and trapezius muscles was within normal limits.  Muscles of facial expression within normal limits.  There was normal mouth opening and range of motion.  The skin of the head and neck was within normal limits.   Intraoral Examination? Findings: WNL There were no mucosal lesions or swelling of the oropharynx, hard palate, soft palate, buccal mucosa, lip mucosa, gingiva, tongue, or floor of mouth.  Major salivary glands palpated and ducts were patent.  There was adequate saliva with moist mucosa and floor of mouth pooling.   Periodontal Findings Periodontal Charting: Updated full mouth periodontal charting and documented all findings in patient's tooth chart: Yes   Dental Findings Dental Exam   Needs prophy, Needs EXT 1,16,17 by O.S Needs filling #29:DO  Will refer to Ortho after cleaning and filling are done   Assessment: Diagnosis: After thorough clinical and radiographic examination, the patient was advised of the following diagnosis:  Caries, restorable    Periodontal diagnosis: Healthy, within normal limits Additional notes: Pt was referred to O.S for extraction of #1,16,17  Additional Treatment Rendered: probing done today   Plan: Treatment options related to findings reviewed and all questions from patient answered.  Prescribed the following medications: PRESCRIPTION: none First visit patient will return for: Prophy / filling #29 pt would like it all in one visit due to not having a babysitter   Patient acknowledge and agreed  understanding of all that was discussed.  Treatment Providers Dental Assistant: Norberta Juniper DAII  Christopher Cave, DDS [facility] [phone]  [place] [phone]        [1] History reviewed. No pertinent past medical history. [2] History reviewed. No pertinent surgical  history. [3] Social History Tobacco Use  . Smoking status: Never  . Smokeless tobacco: Never  Substance Use Topics  . Alcohol use: Never  . Drug use: Never  [4] Family History Problem Relation Name Age of Onset  . Diabetes Mother    . Hypertension Mother    [5] No current outpatient medications on file prior to visit.   No current facility-administered medications on file prior to visit.

## 2024-03-29 NOTE — Progress Notes (Signed)
 Adult prophylaxis [facility] 03/29/24  Dental procedures in this visit  . I7607 - RESIN-BASED COMPOSITE - TWO SURFACES, POSTERIOR 29 DO  . D1110 - PROPHYLAXIS - ADULT (Completed)    Service provider: Sari Kitty, [facility]    Billing provider: Shlomo Ruddy   HEALTH HISTORY ? Vitals:  BP Readings from Last 1 Encounters:  03/29/24 120/72  Pulse:  83  Medical history was reviewed and updated. No contraindication to care.  Medical History[1] Surgical History[2] Social History[3] Family History[4] Medications Ordered Prior to Encounter[5]  Medical Risk Assessment ASA GRADE: ASA 1 - Normal health patient  Subjective: Patient reports no change to condition since last visit.   Objective:  Radiographs taken: No radiographs captured at this appointment of area:   Dental Examination  Intraoral exam completed and findings: wnl  Extraoral exam completed and findings: wnl.  Periodontal Evaluation Periodontal Charting: Updated full mouth periodontal charting and documented all findings in patient's tooth chart: No Radiographic studies show bone levels WNL  Periodontal findings:  Tissue color: Pink, consistency: Stippled Calculus: Light supragingival plaque and calculus Patient oral hygiene: Good Patient's oral hygiene is better since last visit  Assessment: Periodontal Diagnosis: Healthy, within normal limits. Dental Diagnosis: Caries, restorable.  Informed patient of all periodontal findings. Informed patient of recommended treatment procedure(s) based on findings. Patient confirmed they understood and has no additional questions.  Plan: Cavitron, handscale, floss, polish with Nupro coarse paste, Fluoride  varnish (5% Sodium Fluoride ) placed at end of appointment  OHI: Reviewed brushing and flossing technique.    Recommended recall interval: 6 months.  Next Visit: Dr. appointment: fillings  Treatment Providers Sari Kitty, [facility] [place]  [facility] [place] [facility] [address] [place] [id number] [phone]        [1] History reviewed. No pertinent past medical history. [2] History reviewed. No pertinent surgical history. [3] Social History Tobacco Use  . Smoking status: Never  . Smokeless tobacco: Never  Substance Use Topics  . Alcohol use: Never  . Drug use: Never  [4] Family History Problem Relation Name Age of Onset  . Diabetes Mother    . Hypertension Mother    [5] No current outpatient medications on file prior to visit.   No current facility-administered medications on file prior to visit.

## 2024-03-31 ENCOUNTER — Other Ambulatory Visit: Payer: Self-pay

## 2024-03-31 ENCOUNTER — Emergency Department (HOSPITAL_COMMUNITY)

## 2024-03-31 ENCOUNTER — Encounter (HOSPITAL_COMMUNITY): Payer: Self-pay | Admitting: Pharmacy Technician

## 2024-03-31 ENCOUNTER — Observation Stay (HOSPITAL_COMMUNITY)
Admission: EM | Admit: 2024-03-31 | Discharge: 2024-04-01 | Disposition: A | Discharge status: Home / Self Care | Attending: Emergency Medicine | Admitting: Emergency Medicine

## 2024-03-31 DIAGNOSIS — N730 Acute parametritis and pelvic cellulitis: Secondary | ICD-10-CM | POA: Diagnosis present

## 2024-03-31 DIAGNOSIS — Z7982 Long term (current) use of aspirin: Secondary | ICD-10-CM | POA: Diagnosis not present

## 2024-03-31 DIAGNOSIS — N200 Calculus of kidney: Secondary | ICD-10-CM | POA: Insufficient documentation

## 2024-03-31 DIAGNOSIS — R1032 Left lower quadrant pain: Secondary | ICD-10-CM | POA: Diagnosis not present

## 2024-03-31 DIAGNOSIS — N739 Female pelvic inflammatory disease, unspecified: Principal | ICD-10-CM | POA: Insufficient documentation

## 2024-03-31 DIAGNOSIS — R1011 Right upper quadrant pain: Secondary | ICD-10-CM | POA: Diagnosis not present

## 2024-03-31 DIAGNOSIS — R1031 Right lower quadrant pain: Secondary | ICD-10-CM | POA: Diagnosis not present

## 2024-03-31 DIAGNOSIS — N3289 Other specified disorders of bladder: Secondary | ICD-10-CM | POA: Diagnosis not present

## 2024-03-31 DIAGNOSIS — R110 Nausea: Secondary | ICD-10-CM | POA: Diagnosis not present

## 2024-03-31 DIAGNOSIS — R102 Pelvic and perineal pain unspecified side: Secondary | ICD-10-CM | POA: Diagnosis not present

## 2024-03-31 DIAGNOSIS — Z30430 Encounter for insertion of intrauterine contraceptive device: Secondary | ICD-10-CM | POA: Diagnosis not present

## 2024-03-31 LAB — CBC
HCT: 37.9 % (ref 36.0–46.0)
Hemoglobin: 12.4 g/dL (ref 12.0–15.0)
MCH: 30.1 pg (ref 26.0–34.0)
MCHC: 32.7 g/dL (ref 30.0–36.0)
MCV: 92 fL (ref 80.0–100.0)
Platelets: 279 K/uL (ref 150–400)
RBC: 4.12 MIL/uL (ref 3.87–5.11)
RDW: 13.9 % (ref 11.5–15.5)
WBC: 9.3 K/uL (ref 4.0–10.5)
nRBC: 0 % (ref 0.0–0.2)

## 2024-03-31 LAB — COMPREHENSIVE METABOLIC PANEL WITH GFR
ALT: 18 U/L (ref 0–44)
AST: 20 U/L (ref 15–41)
Albumin: 4 g/dL (ref 3.5–5.0)
Alkaline Phosphatase: 56 U/L (ref 38–126)
Anion gap: 7 (ref 5–15)
BUN: 7 mg/dL (ref 6–20)
CO2: 25 mmol/L (ref 22–32)
Calcium: 8.8 mg/dL — ABNORMAL LOW (ref 8.9–10.3)
Chloride: 105 mmol/L (ref 98–111)
Creatinine, Ser: 0.75 mg/dL (ref 0.44–1.00)
GFR, Estimated: >60 mL/min (ref >60)
Glucose, Bld: 100 mg/dL — ABNORMAL HIGH (ref 70–99)
Potassium: 4.1 mmol/L (ref 3.5–5.1)
Sodium: 137 mmol/L (ref 135–145)
Total Bilirubin: 1.6 mg/dL — ABNORMAL HIGH (ref 0.0–1.2)
Total Protein: 7.4 g/dL (ref 6.5–8.1)

## 2024-03-31 LAB — URINALYSIS, ROUTINE W REFLEX MICROSCOPIC
Bilirubin Urine: NEGATIVE
Glucose, UA: NEGATIVE mg/dL
Hgb urine dipstick: NEGATIVE
Ketones, ur: NEGATIVE mg/dL
Nitrite: NEGATIVE
Protein, ur: NEGATIVE mg/dL
Specific Gravity, Urine: 1.017 (ref 1.005–1.030)
pH: 5 (ref 5.0–8.0)

## 2024-03-31 LAB — WET PREP, GENITAL
Clue Cells Wet Prep HPF POC: NONE SEEN
Sperm: NONE SEEN
Trich, Wet Prep: NONE SEEN
WBC, Wet Prep HPF POC: >=10 — AB (ref <10)
Yeast Wet Prep HPF POC: NONE SEEN

## 2024-03-31 LAB — LIPASE, BLOOD: Lipase: 18 U/L (ref 11–51)

## 2024-03-31 LAB — HCG, SERUM, QUALITATIVE: Preg, Serum: NEGATIVE

## 2024-03-31 MED ORDER — CEFTRIAXONE SODIUM 1 G IJ SOLR
1.0000 g | Freq: Once | INTRAMUSCULAR | Status: AC
  Administered 2024-03-31: 1 g via INTRAVENOUS

## 2024-03-31 MED ORDER — SODIUM CHLORIDE 0.9 % IV SOLN
2.0000 g | INTRAVENOUS | Status: DC
  Administered 2024-04-01: 2 g via INTRAVENOUS
  Filled 2024-03-31: qty 20

## 2024-03-31 MED ORDER — OXYCODONE-ACETAMINOPHEN 5-325 MG PO TABS
1.0000 | ORAL_TABLET | ORAL | Status: DC | PRN
  Administered 2024-04-01: 1 via ORAL
  Filled 2024-03-31: qty 1

## 2024-03-31 MED ORDER — DOCUSATE SODIUM 100 MG PO CAPS
100.0000 mg | ORAL_CAPSULE | Freq: Two times a day (BID) | ORAL | Status: DC
  Administered 2024-03-31 – 2024-04-01 (×2): 100 mg via ORAL
  Filled 2024-03-31 (×2): qty 1

## 2024-03-31 MED ORDER — SODIUM CHLORIDE 0.9 % IV SOLN: 100.0000 mg | Freq: Once | INTRAVENOUS | Status: DC

## 2024-03-31 MED ORDER — SODIUM CHLORIDE 0.9 % IV SOLN
100.0000 mg | Freq: Two times a day (BID) | INTRAVENOUS | Status: DC
  Administered 2024-04-01: 100 mg via INTRAVENOUS
  Filled 2024-03-31 (×2): qty 100

## 2024-03-31 MED ORDER — ACETAMINOPHEN 500 MG PO TABS
1000.0000 mg | ORAL_TABLET | Freq: Once | ORAL | Status: AC
  Administered 2024-03-31: 1000 mg via ORAL
  Filled 2024-03-31: qty 2

## 2024-03-31 MED ORDER — LACTATED RINGERS IV SOLN: INTRAVENOUS | Status: DC

## 2024-03-31 MED ORDER — ONDANSETRON HCL 4 MG/2ML IJ SOLN
4.0000 mg | Freq: Four times a day (QID) | INTRAMUSCULAR | Status: DC | PRN
  Administered 2024-04-01: 4 mg via INTRAVENOUS
  Filled 2024-03-31: qty 2

## 2024-03-31 MED ORDER — LACTATED RINGERS IV BOLUS
1000.0000 mL | Freq: Once | INTRAVENOUS | Status: AC
  Administered 2024-03-31: 1000 mL via INTRAVENOUS

## 2024-03-31 MED ORDER — MORPHINE SULFATE (PF) 4 MG/ML IV SOLN
4.0000 mg | Freq: Once | INTRAVENOUS | Status: AC
  Administered 2024-03-31: 4 mg via INTRAVENOUS
  Filled 2024-03-31: qty 1

## 2024-03-31 MED ORDER — ONDANSETRON HCL 4 MG/2ML IJ SOLN
4.0000 mg | Freq: Once | INTRAMUSCULAR | Status: AC
  Administered 2024-03-31: 4 mg via INTRAVENOUS
  Filled 2024-03-31: qty 2

## 2024-03-31 MED ORDER — SODIUM CHLORIDE 0.9 % IV SOLN
1.0000 g | Freq: Once | INTRAVENOUS | Status: DC
  Filled 2024-03-31: qty 10

## 2024-03-31 MED ORDER — IOHEXOL 350 MG/ML SOLN
75.0000 mL | Freq: Once | INTRAVENOUS | Status: AC | PRN
  Administered 2024-03-31: 75 mL via INTRAVENOUS

## 2024-03-31 MED ORDER — SODIUM CHLORIDE 0.9 % IV SOLN
100.0000 mg | Freq: Once | INTRAVENOUS | Status: AC
  Administered 2024-03-31: 100 mg via INTRAVENOUS
  Filled 2024-03-31: qty 100

## 2024-03-31 MED ORDER — IBUPROFEN 600 MG PO TABS
600.0000 mg | ORAL_TABLET | Freq: Four times a day (QID) | ORAL | Status: DC | PRN
  Administered 2024-03-31 – 2024-04-01 (×2): 600 mg via ORAL
  Filled 2024-03-31 (×2): qty 1

## 2024-03-31 MED ORDER — METRONIDAZOLE 500 MG/100ML IV SOLN
500.0000 mg | Freq: Once | INTRAVENOUS | Status: AC
  Administered 2024-03-31: 500 mg via INTRAVENOUS

## 2024-03-31 MED ORDER — SODIUM CHLORIDE 0.9 % BOLUS PEDS
1000.0000 mL | Freq: Once | INTRAVENOUS | Status: AC
  Administered 2024-03-31: 1000 mL via INTRAVENOUS

## 2024-03-31 MED ORDER — METRONIDAZOLE 500 MG/100ML IV SOLN
500.0000 mg | Freq: Once | INTRAVENOUS | Status: DC
  Filled 2024-03-31: qty 100

## 2024-03-31 MED ORDER — PRENATAL MULTIVITAMIN CH: 1.0000 | ORAL_TABLET | Freq: Every day | ORAL | Status: DC

## 2024-03-31 MED ORDER — DOXYCYCLINE HYCLATE 100 MG PO TABS: 100.0000 mg | ORAL_TABLET | Freq: Once | ORAL | Status: DC

## 2024-03-31 MED ORDER — ONDANSETRON HCL 4 MG PO TABS: 4.0000 mg | ORAL_TABLET | Freq: Four times a day (QID) | ORAL | Status: DC | PRN

## 2024-03-31 MED ORDER — METRONIDAZOLE 500 MG/100ML IV SOLN
500.0000 mg | Freq: Two times a day (BID) | INTRAVENOUS | Status: DC
  Administered 2024-03-31 – 2024-04-01 (×2): 500 mg via INTRAVENOUS
  Filled 2024-03-31 (×2): qty 100

## 2024-03-31 NOTE — ED Triage Notes (Addendum)
 Pt here POV with reports of having abdominal cramping, thinking it was related to her period which is supposed to start soon. States in the middle of the night pain became unbearable and sharp. Any movement exacerbates the pain. Endorses vomiting and fever as well.

## 2024-03-31 NOTE — Plan of Care (Signed)

## 2024-03-31 NOTE — ED Notes (Signed)
Pt return from Ultrasound

## 2024-03-31 NOTE — H&P (Signed)
 OBSTETRICS AND GYNECOLOGY ATTENDING NOTE  Assessment/Plan: 1) Suspected PID -IV fluids -IV antibiotics -US  pending  -regular diet -ambulate as tolerated  [name], DO Attending Obstetrician & Gynecologist, [facility] Center for [facility], [facility]   History of Present Illness: Robin [facility] is an 23 y.o. [phone] female who presented to ED due to pelvic pain that started yesterday.  Initially she thought it felt like her period, but notes her menses is not supposed to be until next week.  Pain became intense so she presented to ED.  Notes nausea and vomiting- she has not eaten since arrival to ED.  Notes fever, no chills.  Slight white discharge.  Sexually active with same partner.  Denies irregular bleeding.  Denies urinary complaints.  No other acute gyn concerns.  Pertinent OB/GYN History: No LMP recorded. OB History  Gravida Para Term Preterm AB Living  3 3 3  0 0 3  SAB IAB Ectopic Multiple Live Births  0 0 0 0 3    # Outcome Date GA Lbr Len/2nd Weight Sex Type Anes PTL Lv  3 Term 07/04/23 [redacted]w[redacted]d  3680 g M Vag-Spont EPI  LIV     Name: Robin [facility].     Apgar1: 9  Apgar5: 9  2 Term 01/19/20 [redacted]w[redacted]d 28:59 / 00:58 3065 g F Vag-Spont EPI  LIV     Name: Robin [facility]     Apgar1: 9  Apgar5: 9  1 Term 06/17/16 [redacted]w[redacted]d 02:57 / 00:50 3289 g F Vag-Spont EPI  LIV     Name: Robin [facility]     Apgar1: 8  Apgar5: 9   .gyn  Patient Active Problem List   Diagnosis Date Noted   PID (acute pelvic inflammatory disease) 03/31/2024   Copper  IUD (intrauterine device), immediate postplacental placement on 07/04/23 08/23/2023    Past Medical History:  Diagnosis Date   Gestational diabetes    During 1st pregnancy.   Headache    History of gestational diabetes 04/19/2016   2021: early a1c negative.    Migraine without aura and without status migrainosus, not intractable 10/28/2017   UTI (urinary  tract infection) during pregnancy 07/02/2019   [ ]  needs toc: neg    Past Surgical History:  Procedure Laterality Date   NO PAST SURGERIES      Family History  Problem Relation Age of Onset   Diabetes Mother    Hypertension Mother    Kidney disease Mother    Other Father        unsure of medical history   Diabetes Maternal Grandmother    Hypertension Maternal Grandmother    Kidney disease Maternal Grandmother    Asthma Neg Hx    Cancer Neg Hx    Heart disease Neg Hx     Social History:  reports that she has never smoked. She has never used smokeless tobacco. She reports that she does not drink alcohol and does not use drugs.  Allergies: No Known Allergies  Medications: I have reviewed the patient's current medications.  Review of Systems: Pertinent items noted in HPI and remainder of comprehensive ROS otherwise negative.  Focused Physical Examination: BP (!) 91/58   Pulse 94   Temp (!) 102.9 F (39.4 C) (Oral)   Resp (!) 22   SpO2 99%   SKIN: Skin is warm and dry. No rash noted. Not diaphoretic. No erythema. No pallor. NEUROLGIC: Alert and oriented to person, place, and time. Normal reflexes, muscle tone  coordination. No cranial nerve deficit noted. PSYCHIATRIC: Normal mood and affect. Normal behavior. Normal judgment and thought content. CARDIOVASCULAR: Normal heart rate noted, regular rhythm RESPIRATORY: Clear to auscultation bilaterally. Effort and breath sounds normal, no problems with respiration noted. ABDOMEN: soft, +tenderness lower abdomen- right side more than left PELVIC: Normal appearing external genitalia; normal appearing vaginal mucosa.  Minimal discharge noted.  On bimanual exam- IUD strings noted.  Right adnexal tenderness, minimal cervical motion tenderness.  Normal sized uterus MUSCULOSKELETAL: Normal range of motion. No tenderness.  No cyanosis, clubbing, or edema.  2+ distal pulses.  Labs and Imaging: Results for orders placed or performed  during the hospital encounter of 03/31/24 (from the past 72 hours)  Urinalysis, Routine w reflex microscopic -Urine, Clean Catch     Status: Abnormal   Collection Time: 03/31/24 12:21 PM  Result Value Ref Range   Color, Urine YELLOW YELLOW   APPearance CLOUDY (A) CLEAR   Specific Gravity, Urine 1.017 1.005 - 1.030   pH 5.0 5.0 - 8.0   Glucose, UA NEGATIVE NEGATIVE mg/dL   Hgb urine dipstick NEGATIVE NEGATIVE   Bilirubin Urine NEGATIVE NEGATIVE   Ketones, ur NEGATIVE NEGATIVE mg/dL   Protein, ur NEGATIVE NEGATIVE mg/dL   Nitrite NEGATIVE NEGATIVE   Leukocytes,Ua SMALL (A) NEGATIVE   RBC / HPF 0-5 0 - 5 RBC/hpf   WBC, UA 6-10 0 - 5 WBC/hpf   Bacteria, UA RARE (A) NONE SEEN   Squamous Epithelial / HPF 21-50 0 - 5 /HPF   Mucus PRESENT     Comment: Performed at [facility], [address], [place], [place] [address]  Lipase, blood     Status: None   Collection Time: 03/31/24 12:25 PM  Result Value Ref Range   Lipase 18 11 - 51 U/L    Comment: Performed at [facility], [address], [place], [place] [address]  Comprehensive metabolic panel     Status: Abnormal   Collection Time: 03/31/24 12:25 PM  Result Value Ref Range   Sodium 137 135 - 145 mmol/L   Potassium 4.1 3.5 - 5.1 mmol/L   Chloride 105 98 - 111 mmol/L   CO2 25 22 - 32 mmol/L   Glucose, Bld 100 (H) 70 - 99 mg/dL    Comment: Glucose reference range applies only to samples taken after fasting for at least 8 hours.   BUN 7 6 - 20 mg/dL   Creatinine, Ser 9.24 0.44 - 1.00 mg/dL   Calcium 8.8 (L) 8.9 - 10.3 mg/dL   Total Protein 7.4 6.5 - 8.1 g/dL   Albumin 4.0 3.5 - 5.0 g/dL   AST 20 15 - 41 U/L   ALT 18 0 - 44 U/L   Alkaline Phosphatase 56 38 - 126 U/L   Total Bilirubin 1.6 (H) 0.0 - 1.2 mg/dL   GFR, Estimated >39 >39 mL/min    Comment: (NOTE) Calculated using the CKD-EPI Creatinine Equation (2021)    Anion gap 7 5 - 15    Comment: Performed at [facility], [address], [place], [place]  [address]  CBC     Status: None   Collection Time: 03/31/24 12:25 PM  Result Value Ref Range   WBC 9.3 4.0 - 10.5 K/uL   RBC 4.12 3.87 - 5.11 MIL/uL   Hemoglobin 12.4 12.0 - 15.0 g/dL   HCT 62.0 63.9 - 53.9 %   MCV 92.0 80.0 - 100.0 fL   MCH 30.1 26.0 -  34.0 pg   MCHC 32.7 30.0 - 36.0 g/dL   RDW 86.0 88.4 - 84.4 %   Platelets 279 150 - 400 K/uL   nRBC 0.0 0.0 - 0.2 %    Comment: Performed at [facility], [address], [place], [place] [address]  hCG, serum, qualitative     Status: None   Collection Time: 03/31/24 12:25 PM  Result Value Ref Range   Preg, Serum NEGATIVE NEGATIVE    Comment:        THE SENSITIVITY OF THIS METHODOLOGY IS >10 mIU/mL. Performed at [facility], [address] [place], [place] [address]   Wet prep, genital     Status: Abnormal   Collection Time: 03/31/24  4:45 PM   Specimen: Cervix  Result Value Ref Range   Yeast Wet Prep HPF POC NONE SEEN NONE SEEN   Trich, Wet Prep NONE SEEN NONE SEEN   Clue Cells Wet Prep HPF POC NONE SEEN NONE SEEN   WBC, Wet Prep HPF POC >=10 (A) <10   Sperm NONE SEEN     Comment: Performed at [facility], [address], [place], [place] [address]    CT ABDOMEN PELVIS W CONTRAST Result Date: 03/31/2024 CLINICAL DATA:  Provided history: Peritonitis or perforation suspected RLQ abdominal pain EXAM: CT ABDOMEN AND PELVIS WITH CONTRAST TECHNIQUE: Multidetector CT imaging of the abdomen and pelvis was performed using the standard protocol following bolus administration of intravenous contrast. RADIATION DOSE REDUCTION: This exam was performed according to the departmental dose-optimization program which includes automated exposure control, adjustment of the mA and/or kV according to patient size and/or use of iterative reconstruction technique. CONTRAST:  75mL OMNIPAQUE  IOHEXOL  350 MG/ML SOLN COMPARISON:  Right upper quadrant ultrasound earlier today. CT 05/14/2019. FINDINGS: Lower chest: Minor dependent  atelectasis. Hepatobiliary: No focal liver abnormality is seen. No gallstones, gallbladder wall thickening, or biliary dilatation. Pancreas: Unremarkable. No pancreatic ductal dilatation or surrounding inflammatory changes. Spleen: Normal in size without focal abnormality. Adrenals/Urinary Tract: Normal adrenal glands. Bilateral intrarenal calculi, punctate on the right and 4 mm on the left. No hydronephrosis. Symmetric renal enhancement. Minimally distended urinary bladder, normal for degree of distension. Stomach/Bowel: Stomach is within normal limits. Appendix appears normal. No evidence of bowel wall thickening, distention, or inflammatory changes. Vascular/Lymphatic: No significant vascular findings are present. No enlarged abdominal or pelvic lymph nodes. Reproductive: IUD in place, query low positioning in the endocervical canal. No adnexal mass. Trace free fluid in the pelvis is typically physiologic in a patient of this age. Other: Minimal free fluid in the pelvis is typically physiologic in a patient of this age. No abdominal ascites. No free air. No focal fluid collection. Musculoskeletal: There are no acute or suspicious osseous abnormalities. IMPRESSION: 1. No acute abnormality in the abdomen/pelvis. Normal appendix. 2. Bilateral nonobstructing nephrolithiasis. 3. IUD in place, query low positioning in the endocervical canal. Electronically Signed   By: Andrea Gasman M.D.   On: 03/31/2024 15:38   US  Abdomen Limited RUQ (LIVER/GB) Result Date: 03/31/2024 CLINICAL DATA:  Right upper quadrant pain. EXAM: ULTRASOUND ABDOMEN LIMITED RIGHT UPPER QUADRANT COMPARISON:  None Available. FINDINGS: Gallbladder: No gallstones or wall thickening visualized (2.2 mm). No sonographic Murphy sign noted by sonographer. Common bile duct: Diameter: 3.4 mm Liver: No focal lesion identified. Within normal limits in parenchymal echogenicity. Portal vein is patent on color Doppler imaging with normal direction of blood  flow towards the liver. Other: None. IMPRESSION: Unremarkable right upper  quadrant ultrasound. Electronically Signed   By: Suzen Dials M.D.   On: 03/31/2024 15:10

## 2024-03-31 NOTE — ED Provider Notes (Signed)
  Physical Exam  BP 108/64 (BP Location: Left Arm)   Pulse 99   Temp 100.2 F (37.9 C) (Oral)   Resp 18   SpO2 100%   Physical Exam  Procedures  Procedures  ED Course / MDM   Clinical Course as of 03/31/24 1512  Sat Mar 31, 2024  1510 24. Cramping, generalized abd pain. N/V pain with movement. RU and LQ tenderness.  [ ]  CT [ ]  RUQ US  [ ]  If negative, pelvic exam. Denies discharge, bleeding or high risk so STI [LB]    Clinical Course User Index [LB] Sharlet Dowdy, MD   Medical Decision Making Amount and/or Complexity of Data Reviewed Labs: ordered. Radiology: ordered.  Risk Prescription drug management.   ***

## 2024-03-31 NOTE — ED Provider Notes (Signed)
 [place] EMERGENCY DEPARTMENT AT [facility] Provider Note   CSN: [id number] Arrival date & time: 03/31/24  1205     Patient presents with: Abdominal Pain   Robin [facility] is a 23 y.o. female.  {Add pertinent medical, surgical, social history, OB history to HPI:32923} HPI    23 year old female comes in with chief complaint of severe abdominal pain.  Patient has no past medical history, surgical history.  She reports that she started having some vague abdominal pain around 9 or 10 PM yesterday night.  The pain however woke her up in the middle of the night at 2 AM.  The pain is excruciating she has had 3 episodes of vomiting and remains nauseous.  Her pain is worse with any activity.  She has no UTI-like symptoms, denies vaginal discharge or bleeding.  Patient denies high risk behavior for STI.  Prior to Admission medications   Medication Sig Start Date End Date Taking? Authorizing Provider  acetaminophen  (TYLENOL ) 325 MG tablet Take 2 tablets (650 mg total) by mouth every 6 (six) hours as needed for mild pain (pain score 1-3). Patient not taking: Reported on 06/29/2023 06/04/23   Littie Olam LABOR, NP  ibuprofen  (ADVIL ) 600 MG tablet Take 1 tablet (600 mg total) by mouth every 6 (six) hours. 07/05/23   Wouk, Devaughn Sayres, MD  Prenatal Vit-Fe Fumarate-FA (MULTIVITAMIN-PRENATAL) 27-0.8 MG TABS tablet Take 1 tablet by mouth daily at 12 noon.    [provider]    Allergies: Patient has no known allergies.    Review of Systems  All other systems reviewed and are negative.   Updated Vital Signs BP 108/64 (BP Location: Left Arm)   Pulse 99   Temp 100.2 F (37.9 C) (Oral)   Resp 18   SpO2 100%   Physical Exam Vitals and nursing note reviewed.  Constitutional:      Appearance: She is well-developed.  HENT:     Head: Atraumatic.  Cardiovascular:     Rate and Rhythm: Normal rate.  Pulmonary:     Effort: Pulmonary effort is normal.   Abdominal:     Tenderness: There is abdominal tenderness in the right upper quadrant and right lower quadrant. There is guarding and rebound. Positive signs include Murphy's sign and McBurney's sign.  Musculoskeletal:     Cervical back: Normal range of motion and neck supple.  Skin:    General: Skin is warm and dry.  Neurological:     Mental Status: She is alert and oriented to person, place, and time.     (all labs ordered are listed, but only abnormal results are displayed) Labs Reviewed  COMPREHENSIVE METABOLIC PANEL WITH GFR - Abnormal; Notable for the following components:      Result Value   Glucose, Bld 100 (*)    Calcium 8.8 (*)    Total Bilirubin 1.6 (*)    All other components within normal limits  URINALYSIS, ROUTINE W REFLEX MICROSCOPIC - Abnormal; Notable for the following components:   APPearance CLOUDY (*)    Leukocytes,Ua SMALL (*)    Bacteria, UA RARE (*)    All other components within normal limits  LIPASE, BLOOD  CBC  HCG, SERUM, QUALITATIVE    EKG: None  Radiology: US  Abdomen Limited RUQ (LIVER/GB) Result Date: 03/31/2024 CLINICAL DATA:  Right upper quadrant pain. EXAM: ULTRASOUND ABDOMEN LIMITED RIGHT UPPER QUADRANT COMPARISON:  None Available. FINDINGS: Gallbladder: No gallstones or wall thickening visualized (2.2 mm). No sonographic Murphy sign noted by  sonographer. Common bile duct: Diameter: 3.4 mm Liver: No focal lesion identified. Within normal limits in parenchymal echogenicity. Portal vein is patent on color Doppler imaging with normal direction of blood flow towards the liver. Other: None. IMPRESSION: Unremarkable right upper quadrant ultrasound. Electronically Signed   By: Suzen Dials M.D.   On: 03/31/2024 15:10    {Document cardiac monitor, telemetry assessment procedure when appropriate:32947} Procedures   Medications Ordered in the ED  morphine  (PF) 4 MG/ML injection 4 mg (4 mg Intravenous Given 03/31/24 1408)  ondansetron  (ZOFRAN )  injection 4 mg (4 mg Intravenous Given 03/31/24 1406)  lactated ringers  bolus 1,000 mL (1,000 mLs Intravenous New Bag/Given 03/31/24 1406)  iohexol  (OMNIPAQUE ) 350 MG/ML injection 75 mL (75 mLs Intravenous Contrast Given 03/31/24 1503)    Clinical Course as of 03/31/24 1516  Sat Mar 31, 2024  1510 24. Cramping, generalized abd pain. N/V pain with movement. RU and LQ tenderness.  [ ]  CT [ ]  RUQ US  [ ]  If negative, pelvic exam. Denies discharge, bleeding or high risk so STI [LB]    Clinical Course User Index [LB] Sharlet Dowdy, MD   {Click here for ABCD2, HEART and other calculators REFRESH Note before signing:1}                              Medical Decision Making Amount and/or Complexity of Data Reviewed Labs: ordered. Radiology: ordered.  Risk Prescription drug management.   23 year old patient comes in with chief complaint of severe abdominal pain.  Differential diagnosis considered this patient includes acute cholecystitis, peritonitis, acute appendicitis, perforated viscus, ectopic pregnancy. Low suspicion for PID, TOA and ovarian torsion based on exam.  Initial plan is to get US  abd and CT abd.  If neg - will need pelvic exam and repeat abdominal exam and evaluation to see if she needs ultrasound torsion. Final diagnoses:  None    ED Discharge Orders     None

## 2024-03-31 NOTE — ED Notes (Signed)
 Patient transported to CT

## 2024-04-01 ENCOUNTER — Other Ambulatory Visit (HOSPITAL_COMMUNITY): Payer: Self-pay

## 2024-04-01 ENCOUNTER — Encounter (HOSPITAL_COMMUNITY): Payer: Self-pay | Admitting: Family Medicine

## 2024-04-01 DIAGNOSIS — N730 Acute parametritis and pelvic cellulitis: Secondary | ICD-10-CM | POA: Diagnosis not present

## 2024-04-01 LAB — CBC WITH DIFFERENTIAL/PLATELET
Abs Immature Granulocytes: 0.01 K/uL (ref 0.00–0.07)
Basophils Absolute: 0 K/uL (ref 0.0–0.1)
Basophils Relative: 0 %
Eosinophils Absolute: 0.1 K/uL (ref 0.0–0.5)
Eosinophils Relative: 3 %
HCT: 33.1 % — ABNORMAL LOW (ref 36.0–46.0)
Hemoglobin: 10.6 g/dL — ABNORMAL LOW (ref 12.0–15.0)
Immature Granulocytes: 0 %
Lymphocytes Relative: 35 %
Lymphs Abs: 1.7 K/uL (ref 0.7–4.0)
MCH: 29.5 pg (ref 26.0–34.0)
MCHC: 32 g/dL (ref 30.0–36.0)
MCV: 92.2 fL (ref 80.0–100.0)
Monocytes Absolute: 0.9 K/uL (ref 0.1–1.0)
Monocytes Relative: 18 %
Neutro Abs: 2 K/uL (ref 1.7–7.7)
Neutrophils Relative %: 44 %
Platelets: 245 K/uL (ref 150–400)
RBC: 3.59 MIL/uL — ABNORMAL LOW (ref 3.87–5.11)
RDW: 14.1 % (ref 11.5–15.5)
WBC: 4.8 K/uL (ref 4.0–10.5)
nRBC: 0 % (ref 0.0–0.2)

## 2024-04-01 MED ORDER — METRONIDAZOLE 500 MG PO TABS
500.0000 mg | ORAL_TABLET | Freq: Two times a day (BID) | ORAL | 0 refills | Status: AC
  Filled 2024-04-01: qty 28, 14d supply, fill #0

## 2024-04-01 MED ORDER — ONDANSETRON HCL 4 MG PO TABS
4.0000 mg | ORAL_TABLET | Freq: Four times a day (QID) | ORAL | 0 refills | Status: DC | PRN
  Filled 2024-04-01: qty 20, 5d supply, fill #0

## 2024-04-01 MED ORDER — IBUPROFEN 800 MG PO TABS
800.0000 mg | ORAL_TABLET | Freq: Three times a day (TID) | ORAL | 0 refills | Status: DC | PRN
  Filled 2024-04-01: qty 30, 10d supply, fill #0

## 2024-04-01 MED ORDER — DOXYCYCLINE HYCLATE 100 MG PO TABS
100.0000 mg | ORAL_TABLET | Freq: Two times a day (BID) | ORAL | 0 refills | Status: AC
  Filled 2024-04-01: qty 28, 14d supply, fill #0

## 2024-04-01 MED ORDER — OXYCODONE HCL 5 MG PO TABS
5.0000 mg | ORAL_TABLET | Freq: Four times a day (QID) | ORAL | 0 refills | Status: DC | PRN
  Filled 2024-04-01: qty 8, 2d supply, fill #0

## 2024-04-01 MED ORDER — ACETAMINOPHEN-CAFFEINE 500-65 MG PO TABS
2.0000 | ORAL_TABLET | Freq: Four times a day (QID) | ORAL | Status: DC | PRN
  Administered 2024-04-01: 2 via ORAL
  Filled 2024-04-01 (×2): qty 2

## 2024-04-01 MED ORDER — FLUCONAZOLE 150 MG PO TABS
150.0000 mg | ORAL_TABLET | Freq: Once | ORAL | 0 refills | Status: AC
  Filled 2024-04-01: qty 1, 1d supply, fill #0

## 2024-04-01 NOTE — Plan of Care (Signed)

## 2024-04-01 NOTE — Discharge Summary (Signed)
 Physician Discharge Summary  Patient ID: Robin [facility] MRN: [id number] DOB/AGE: February 15, 2001 23 y.o.  Admit date: 03/31/2024 Discharge date: 04/01/2024  Admission Diagnoses:  Discharge Diagnoses:  Principal Problem:   PID (acute pelvic inflammatory disease)   Discharged Condition: good  Hospital Course: Pt was admitted with presumed PID due to fever and severe abdominal pain.  No elevated white count noted.  Pt was given empiric therapy with IV fluids and antibiotics. CT scan and  ultrasound were both found to be negative.  Consults: None  Significant Diagnostic Studies: labs: cbc  Treatments: antibiotics: ceftriaxone , metronidazole , and doxycycline  Discharge Exam: Blood pressure 103/65, pulse (!) 59, temperature 98.3 F (36.8 C), temperature source Oral, resp. rate 16, height 5' 2 (1.575 m), weight 73.9 kg, SpO2 100%, unknown if currently breastfeeding. General appearance: alert, cooperative, and no distress Head: Normocephalic, without obvious abnormality, atraumatic Resp: clear to auscultation bilaterally Cardio: regular rate and rhythm GI: soft, nontender, nondistended  Disposition: Discharge disposition: 01-Home or Self Care       Discharge Instructions     Call MD for:  difficulty breathing, headache or visual disturbances   Complete by: As directed    Call MD for:  persistant dizziness or light-headedness   Complete by: As directed    Call MD for:  persistant nausea and vomiting   Complete by: As directed    Call MD for:  severe uncontrolled pain   Complete by: As directed    Call MD for:  temperature >100.4   Complete by: As directed    Diet general   Complete by: As directed    Increase activity slowly   Complete by: As directed    Sexual Activity Restrictions   Complete by: As directed    Pelvic rest 2 weeks      Allergies as of 04/01/2024   No Known Allergies      Medication List     TAKE these medications     aspirin -acetaminophen -caffeine 250-250-65 MG tablet Commonly known as: EXCEDRIN MIGRAINE Take 2 tablets by mouth every 6 (six) hours as needed for headache or migraine.   doxycycline 100 MG capsule Commonly known as: VIBRAMYCIN Take 1 capsule (100 mg total) by mouth 2 (two) times daily for 14 days.   fluconazole  150 MG tablet Commonly known as: DIFLUCAN  Take 1 tablet (150 mg total) by mouth once for 1 dose.   ibuprofen  800 MG tablet Commonly known as: ADVIL  Take 1 tablet (800 mg total) by mouth every 8 (eight) hours as needed (mild pain).   metroNIDAZOLE  500 MG tablet Commonly known as: FLAGYL  Take 1 tablet (500 mg total) by mouth 2 (two) times daily for 14 days.   ondansetron  4 MG tablet Commonly known as: ZOFRAN  Take 1 tablet (4 mg total) by mouth every 6 (six) hours as needed for nausea.   oxyCODONE  5 MG immediate release tablet Commonly known as: Oxy IR/ROXICODONE  Take 1 tablet (5 mg total) by mouth every 6 (six) hours as needed for severe pain (pain score 7-10) or breakthrough pain.        Follow-up Information     [facility] for [facility] at [facility] Schedule an appointment as soon as possible for a visit in 10 day(s).   Specialty: Obstetrics and Gynecology Why: hospital follow up in 7-10 days Contact information: [address] [place]  [address] [phone]                Signed: Jerilynn  A Aymar Whitfill 04/01/2024, 1:01 PM

## 2024-04-01 NOTE — Plan of Care (Signed)
  Problem: Education: Goal: Knowledge of General Education information will improve Description: Including pain rating scale, medication(s)/side effects and non-pharmacologic comfort measures 04/01/2024 1533 by Arlys Alan RAMAN, RN Outcome: Completed/Met 04/01/2024 1533 by Arlys Alan RAMAN, RN Outcome: Progressing 04/01/2024 0704 by Arlys Alan RAMAN, RN Outcome: Progressing   Problem: Health Behavior/Discharge Planning: Goal: Ability to manage health-related needs will improve 04/01/2024 1533 by Arlys Alan RAMAN, RN Outcome: Completed/Met 04/01/2024 1533 by Arlys Alan RAMAN, RN Outcome: Progressing 04/01/2024 0704 by Arlys Alan RAMAN, RN Outcome: Progressing   Problem: Clinical Measurements: Goal: Ability to maintain clinical measurements within normal limits will improve 04/01/2024 1533 by Arlys Alan RAMAN, RN Outcome: Completed/Met 04/01/2024 1533 by Arlys Alan RAMAN, RN Outcome: Progressing 04/01/2024 0704 by Arlys Alan RAMAN, RN Outcome: Progressing Goal: Will remain free from infection 04/01/2024 1533 by Arlys Alan RAMAN, RN Outcome: Completed/Met 04/01/2024 1533 by Arlys Alan RAMAN, RN Outcome: Progressing 04/01/2024 0704 by Arlys Alan RAMAN, RN Outcome: Progressing Goal: Diagnostic test results will improve 04/01/2024 1533 by Arlys Alan RAMAN, RN Outcome: Completed/Met 04/01/2024 1533 by Arlys Alan RAMAN, RN Outcome: Progressing 04/01/2024 0704 by Arlys Alan RAMAN, RN Outcome: Progressing Goal: Respiratory complications will improve 04/01/2024 1533 by Arlys Alan RAMAN, RN Outcome: Completed/Met 04/01/2024 1533 by Arlys Alan RAMAN, RN Outcome: Progressing 04/01/2024 0704 by Arlys Alan RAMAN, RN Outcome: Progressing Goal: Cardiovascular complication will be avoided 04/01/2024 1533 by Arlys Alan RAMAN, RN Outcome: Completed/Met 04/01/2024 1533 by Arlys Alan RAMAN, RN Outcome: Progressing 04/01/2024 0704 by Arlys Alan RAMAN, RN Outcome: Progressing   Problem: Activity: Goal: Risk for activity intolerance will  decrease 04/01/2024 1533 by Arlys Alan RAMAN, RN Outcome: Completed/Met 04/01/2024 1533 by Arlys Alan RAMAN, RN Outcome: Progressing 04/01/2024 0704 by Arlys Alan RAMAN, RN Outcome: Progressing   Problem: Nutrition: Goal: Adequate nutrition will be maintained 04/01/2024 1533 by Arlys Alan RAMAN, RN Outcome: Completed/Met 04/01/2024 1533 by Arlys Alan RAMAN, RN Outcome: Progressing 04/01/2024 0704 by Arlys Alan RAMAN, RN Outcome: Progressing   Problem: Coping: Goal: Level of anxiety will decrease 04/01/2024 1533 by Arlys Alan RAMAN, RN Outcome: Completed/Met 04/01/2024 1533 by Arlys Alan RAMAN, RN Outcome: Progressing 04/01/2024 0704 by Arlys Alan RAMAN, RN Outcome: Progressing   Problem: Elimination: Goal: Will not experience complications related to bowel motility 04/01/2024 1533 by Arlys Alan RAMAN, RN Outcome: Completed/Met 04/01/2024 1533 by Arlys Alan RAMAN, RN Outcome: Progressing 04/01/2024 0704 by Arlys Alan RAMAN, RN Outcome: Progressing Goal: Will not experience complications related to urinary retention 04/01/2024 1533 by Arlys Alan RAMAN, RN Outcome: Completed/Met 04/01/2024 1533 by Arlys Alan RAMAN, RN Outcome: Progressing 04/01/2024 0704 by Arlys Alan RAMAN, RN Outcome: Progressing   Problem: Pain Managment: Goal: General experience of comfort will improve and/or be controlled 04/01/2024 1533 by Arlys Alan RAMAN, RN Outcome: Completed/Met 04/01/2024 1533 by Arlys Alan RAMAN, RN Outcome: Progressing 04/01/2024 0704 by Arlys Alan RAMAN, RN Outcome: Progressing   Problem: Safety: Goal: Ability to remain free from injury will improve 04/01/2024 1533 by Arlys Alan RAMAN, RN Outcome: Completed/Met 04/01/2024 1533 by Arlys Alan RAMAN, RN Outcome: Progressing 04/01/2024 0704 by Arlys Alan RAMAN, RN Outcome: Progressing   Problem: Skin Integrity: Goal: Risk for impaired skin integrity will decrease 04/01/2024 1533 by Arlys Alan RAMAN, RN Outcome: Completed/Met 04/01/2024 1533 by Arlys Alan RAMAN, RN Outcome:  Progressing 04/01/2024 0704 by Arlys Alan RAMAN, RN Outcome: Progressing

## 2024-04-02 ENCOUNTER — Ambulatory Visit (INDEPENDENT_AMBULATORY_CARE_PROVIDER_SITE_OTHER): Payer: Self-pay | Admitting: Primary Care

## 2024-04-02 LAB — GC/CHLAMYDIA PROBE AMP (~~LOC~~) NOT AT ARMC
Chlamydia: NEGATIVE
Comment: NEGATIVE
Comment: NORMAL
Neisseria Gonorrhea: NEGATIVE

## 2024-04-05 ENCOUNTER — Ambulatory Visit: Admitting: Obstetrics and Gynecology

## 2024-04-05 ENCOUNTER — Encounter: Payer: Self-pay | Admitting: Obstetrics and Gynecology

## 2024-04-05 VITALS — BP 95/58 | HR 94 | Ht 62.0 in | Wt 166.0 lb

## 2024-04-05 DIAGNOSIS — F32A Depression, unspecified: Secondary | ICD-10-CM

## 2024-04-05 DIAGNOSIS — K5900 Constipation, unspecified: Secondary | ICD-10-CM | POA: Diagnosis not present

## 2024-04-05 DIAGNOSIS — F419 Anxiety disorder, unspecified: Secondary | ICD-10-CM | POA: Diagnosis not present

## 2024-04-05 DIAGNOSIS — R109 Unspecified abdominal pain: Secondary | ICD-10-CM | POA: Diagnosis not present

## 2024-04-05 MED ORDER — POLYETHYLENE GLYCOL 3350 17 G PO PACK: 17.0000 g | PACK | Freq: Every day | ORAL | 1 refills | Status: AC

## 2024-04-05 MED ORDER — METOCLOPRAMIDE HCL 10 MG PO TABS: 10.0000 mg | ORAL_TABLET | Freq: Two times a day (BID) | ORAL | 0 refills | Status: DC | PRN

## 2024-04-05 NOTE — Progress Notes (Signed)
 Obstetrics and Gynecology Visit Return Patient Evaluation  Appointment Date: 04/05/2024  Primary Care Provider: Celestia Rosaline SQUIBB  OBGYN Clinic: [facility]  Chief Complaint: follow up abdominal pain admission  History of Present Illness:  Robin Strickland is a 23 y.o. admitted overnight from 10/25 to 26 for abdominal pain. CBC wnl with WBC 9.3, cmp and lipase also wnl with negative quant and wet prep. GC/CT evenually came back negative. HD#2 wbc 4.8. CT showed b/l non obstructing kidney stones with negative ruq u/s and pelvic u/s, which also showed IUD in proper position. Patient received rocephin  and doxy and was discharged to home on doxy and flagyl .   Interval History: Since that time, she states that pain is better but she endorses constipation and not needing the narcotic but uses the ibuprofen  and also with nausea. She denies any VB, discharge.   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 She lives with her 12 month old, 24 year old and her 24 year old and an 58 year old step child as well as her husband who she states helps out at home. She states that she feels like she isn't doing enough and has difficulty sleeping and taking time to herself and having to be admitted and told to be on bedrest at home exacerbated this. She is also afraid to leave the house, such as to go grocery shopping, due to fear of bad things happening.     04/05/2024   11:45 AM 04/20/2023   11:20 AM 04/13/2023    8:55 AM 04/01/2023    1:06 PM 12/29/2022    3:44 PM  Depression screen PHQ 2/9  Decreased Interest 3 0 2 0 3  Down, Depressed, Hopeless 3 0 1 0 2  PHQ - 2 Score 6 0 3 0 5  Altered sleeping 3  3  1   Tired, decreased energy 3  1  3   Change in appetite 3  2  3   Feeling bad or failure about yourself  3  0  0  Trouble concentrating 3  0  1  Moving slowly or fidgety/restless 0  0  0  Suicidal thoughts 0  0  0  PHQ-9 Score 21  9  13    Difficult doing work/chores   Not difficult at all  Very difficult      04/05/2024   11:46 AM 04/13/2023    8:55 AM 06/02/2022   10:37 AM 03/25/2020    3:18 PM  GAD 7 : Generalized Anxiety Score  Nervous, Anxious, on Edge 3 0 0 0  Control/stop worrying 3 0 0 0  Worry too much - different things 3 0 0 0  Trouble relaxing 3 0 0 0  Restless 0 0 3 0  Easily annoyed or irritable 3 3 3  0  Afraid - awful might happen 3 0 0 0  Total GAD 7 Score 18 3 6  0  Anxiety Difficulty  Not difficult at all  Not difficult at all      Review of Systems: as noted in the History of Present Illness.  Medications:  Latesha J. Galvez-Granados had no medications administered during this visit. Current Outpatient Medications  Medication Sig Dispense Refill   aspirin -acetaminophen -caffeine (EXCEDRIN MIGRAINE) 250-250-65 MG tablet Take 2 tablets by mouth every 6 (six) hours as needed for headache or migraine.     doxycycline (VIBRA-TABS) 100 MG tablet Take 1 tablet (100 mg total) by mouth 2 (two) times daily for  14 days. 28 tablet 0   ibuprofen  (ADVIL ) 800 MG tablet Take 1 tablet (800 mg total) by mouth every 8 (eight) hours as needed (mild pain). 30 tablet 0   metroNIDAZOLE  (FLAGYL ) 500 MG tablet Take 1 tablet (500 mg total) by mouth 2 (two) times daily for 14 days. 28 tablet 0   ondansetron  (ZOFRAN ) 4 MG tablet Take 1 tablet (4 mg total) by mouth every 6 (six) hours as needed for nausea. 20 tablet 0   polyethylene glycol (MIRALAX) 17 g packet Take 17 g by mouth daily for 7 days. 7 packet 1   oxyCODONE  (OXY IR/ROXICODONE ) 5 MG immediate release tablet Take 1 tablet (5 mg total) by mouth every 6 (six) hours as needed for severe pain (pain score 7-10) or breakthrough pain. (Patient not taking: Reported on 04/05/2024) 8 tablet 0   No current facility-administered medications for this visit.    Allergies: has no known allergies.  Physical Exam:  BP (!) 95/58   Pulse 94   Ht 5' 2 (1.575 m)   Wt 166 lb  (75.3 kg)   LMP 03/09/2024   BMI 30.36 kg/m  Body mass index is 30.36 kg/m. General appearance: Well nourished, well developed female in no acute distress.  Abdomen: benign Neuro/Psych:  Normal mood and affect.    Assessment: patient stable  Plan:  1. Anxiety and depression (Primary) D/w her re: strategies to help with this. Referral made to [facility]. Patient denies any desire for self harm or to others and ER resources d/w - Ambulatory referral to Integrated Behavioral Health  2. Abdominal pain Unclear etiology, but I told her that I don't feel it's related to anything GYN related. Patient due for pap but declines today. She's had the paragard  in since January 2025 and imaging appears to be wnl. If still having s/s at next visit, can offer to remove it. Can also refer to GI and/or Urology. Miralax x 7 days sent in to help with constipation. After visit patient would like something else of nausea so reglan sent in, which may help with constipation, to come off the zofran .   Return in about 6 weeks (around 05/17/2024) for in person.  Future Appointments  Date Time Provider Department Center  05/17/2024  1:10 PM Izell Harari, MD [facility]  07/09/2024 10:30 AM Onita Duos, MD [facility] None    Harari Izell Raddle MD Attending [facility])

## 2024-04-06 ENCOUNTER — Ambulatory Visit: Payer: Self-pay

## 2024-04-06 NOTE — Telephone Encounter (Signed)
 Called pt to schedule appt. Pt did not answer and could not LVM.

## 2024-04-06 NOTE — Telephone Encounter (Signed)
 FYI Only or Action Required?: Action required by provider: request for appointment.  Patient was last seen in primary care on 06/02/2022 by Celestia Rosaline SQUIBB, NP.  Called Nurse Triage reporting Vomiting.  Symptoms began yesterday.  Interventions attempted: Nothing.  Symptoms are: stable.  Triage Disposition: See PCP When Office is Open (Within 3 Days)  Patient/caregiver understands and will follow disposition?: Yes Reason for Disposition  [1] Few streaks of blood in vomit AND [2] occurred one time  Answer Assessment - Initial Assessment Questions Patient taking Zofran  for nausea, states getting really nauseous. Patient states she is throwing up within 10 minutes of taking antibiotics, denies taking them on an empty stomach. Patient is taking Doxycycline 100mg  and metroNIDAZOLE  (FLAGYL ). Taking Ibuprofen  800 mg. Patient needs a hospital F/u with PCP, no available appointments please advise. Advised UC or ED over the weekend if unable to have BM or unable to keep foods down.  1. APPEARANCE of BLOOD: What does the blood look like? (e.g., pink, red blood, coffee-grounds)     Red  2. AMOUNT: How much blood was lost? (e.g., few streaks or strands, tablespoon, cup)     Strand   3. VOMITING BLOOD: How many times did it happen? or How many times in the past 24 hours?     Once  4. VOMITING WITHOUT BLOOD: How many times in the past 24 hours?      3 times today, states stuck at toilet for a good 5 minutes throwing up  5. ONSET: When did vomiting of blood begin?     Vomiting for a couple days, blood in vomit yesterday  6. CAUSE: What do you think is causing the vomiting of blood?     Unsure, antibiotics  7. BLOOD THINNERS: Do you take any blood thinners? (e.g., Coumadin/warfarin, Pradaxa/dabigatran, aspirin )     Denied  8. DEHYDRATION: Are there any signs of dehydration? When was the last time you urinated? Do you feel dizzy?     Last urinated 2 minutes ago,  denies feeling dizzy or faint  9. ABDOMEN PAIN: Are you having any abdomen pain? If Yes, ask: What does it feel like? (e.g., crampy, dull, intermittent, constant)      Burning sensation at top of stomach   10. DIARRHEA: Is there any diarrhea? If Yes, ask: How many times today?        Denies  11. OTHER SYMPTOMS: Do you have any other symptoms? (e.g., fever, blood in stool)       Loss of appetite, has not had a BM in a week. Patient stated she has also gained about 4lbs in one week.  Protocols used: Vomiting Blood-A-AH   Copied from CRM #[id number]. Topic: Clinical - Red Word Triage >> Apr 06, 2024  1:44 PM Anairis L wrote: [facility] that prompted transfer to Nurse Triage: Vomiting blood, unable to keep anything down.Hospital discharge 04/01/2024

## 2024-04-27 NOTE — BH Specialist Note (Deleted)
 Integrated Behavioral Health via Telemedicine Visit  04/27/2024 Robin Strickland [id number]  Number of Integrated Behavioral Health Clinician visits: No data recorded Session Start time: No data recorded  Session End time: No data recorded Total time in minutes: No data recorded   Referring Provider: Bebe Furry, MD Patient/Family location: Home*** [facility] Provider location: [facility] for [facility]  All persons participating in visit: [name] and Robin Strickland ***  Types of Service: {CHL AMB TYPE OF SERVICE:[phone]}  I connected with Robin Strickland and/or Robin Strickland's {family members:20773} via  Telephone or Engineer, Civil (consulting)  (Video is Caregility application) and verified that I am speaking with the correct person using two identifiers. Discussed confidentiality: Yes   I discussed the limitations of telemedicine and the availability of in person appointments.  Discussed there is a possibility of technology failure and discussed alternative modes of communication if that failure occurs.  I discussed that engaging in this telemedicine visit, they consent to the provision of behavioral healthcare and the services will be billed under their insurance.  Patient and/or legal guardian expressed understanding and consented to Telemedicine visit: Yes   Presenting Concerns: Patient and/or family reports the following symptoms/concerns: *** Duration of problem: ***; Severity of problem: {Mild/Moderate/Severe:20260}  Patient and/or Family's Strengths/Protective Factors: {CHL AMB BH PROTECTIVE [phone]}  Goals Addressed: Patient will:  Reduce symptoms of: {IBH Symptoms:21014056}   Increase knowledge and/or ability of: {IBH Patient Tools:21014057}   Demonstrate ability to: {IBH Goals:21014053}  Progress towards Goals: {CHL AMB BH  PROGRESS TOWARDS GOALS:[phone]}    Interventions: Interventions utilized:  {IBH Interventions:21014054} Standardized Assessments completed: {IBH Screening Tools:21014051}  Clinical Assessment/Diagnosis  No diagnosis found.   Patient may benefit from psychoeducation and brief therapeutic interventions regarding coping with symptoms of *** .  Plan: Follow up with behavioral health clinician on : *** Behavioral recommendations: *** Referral(s): {IBH Referrals:21014055}  I discussed the assessment and treatment plan with the patient and/or parent/guardian. They were provided an opportunity to ask questions and all were answered. They agreed with the plan and demonstrated an understanding of the instructions.   They were advised to call back or seek an in-person evaluation if the symptoms worsen or if the condition fails to improve as anticipated.  Warren JAYSON Mering, LCSW     04/05/2024   11:45 AM 04/20/2023   11:20 AM 04/13/2023    8:55 AM 04/01/2023    1:06 PM 12/29/2022    3:44 PM  Depression screen PHQ 2/9  Decreased Interest 3 0 2 0 3  Down, Depressed, Hopeless 3 0 1 0 2  PHQ - 2 Score 6 0 3 0 5  Altered sleeping 3  3  1   Tired, decreased energy 3  1  3   Change in appetite 3  2  3   Feeling bad or failure about yourself  3  0  0  Trouble concentrating 3  0  1  Moving slowly or fidgety/restless 0  0  0  Suicidal thoughts 0  0  0  PHQ-9 Score 21   9   13    Difficult doing work/chores   Not difficult at all  Very difficult     Data saved with a previous flowsheet row definition      04/05/2024   11:46 AM 04/13/2023    8:55 AM 06/02/2022   10:37 AM 03/25/2020    3:18 PM  GAD 7 : Generalized Anxiety Score  Nervous,  Anxious, on Edge 3 0 0 0  Control/stop worrying 3 0 0 0  Worry too much - different things 3 0 0 0  Trouble relaxing 3 0 0 0  Restless 0 0 3 0  Easily annoyed or irritable 3 3 3  0  Afraid - awful might happen 3 0 0 0  Total GAD 7 Score 18 3 6  0   Anxiety Difficulty  Not difficult at all  Not difficult at all

## 2024-04-30 ENCOUNTER — Encounter

## 2024-05-01 ENCOUNTER — Ambulatory Visit (INDEPENDENT_AMBULATORY_CARE_PROVIDER_SITE_OTHER): Admitting: Clinical

## 2024-05-01 DIAGNOSIS — F39 Unspecified mood [affective] disorder: Secondary | ICD-10-CM | POA: Diagnosis not present

## 2024-05-01 NOTE — BH Specialist Note (Unsigned)
 Integrated Behavioral Health via Telemedicine Visit  05/02/2024 [name] [id number]  Number of Integrated Behavioral Health Clinician visits: 1- Initial Visit  Session Start time: 0818   Session End time: 0920  Total time in minutes: 62  Referring Provider: Bebe Furry, MD Patient/Family location: Home [facility] Provider location: [facility] for [facility]  All persons participating in visit: [name] and Robin Strickland   Types of Service: Individual psychotherapy and Video visit  I connected with Robin Strickland and/or Robin Strickland's n/a via  Telephone or Video Enabled Telemedicine Application  (Video is Caregility application) and verified that I am speaking with the correct person using two identifiers. Discussed confidentiality: Yes   I discussed the limitations of telemedicine and the availability of in person appointments.  Discussed there is a possibility of technology failure and discussed alternative modes of communication if that failure occurs.  I discussed that engaging in this telemedicine visit, they consent to the provision of behavioral healthcare and the services will be billed under their insurance.  Patient and/or legal guardian expressed understanding and consented to Telemedicine visit: Yes   Presenting Concerns: Patient and/or family reports the following symptoms/concerns: Increasing anxiety and depression; worry and rumination preventing ability to fall asleep, which leads to migraine; shaky all day and all over the place, overwhelmed. Melatonin does not help to sleep; caffeine  has no effect. Pt currently copes by going to the gym; is open to implementing self-coping strategies today; interested in finding therapist to help her daughter cope with trauma. Pt does notice increased mood instability the week prior to period. Duration of problem:  Increasing over time; Severity of problem: severe  Patient and/or Family's Strengths/Protective Factors: Social connections, Concrete supports in place (healthy food, safe environments, etc.), Sense of purpose, and Physical Health (exercise, healthy diet, medication compliance, etc.)  Goals Addressed: Patient will:  Reduce symptoms of: anxiety, depression, insomnia, mood instability, and stress   Increase knowledge and/or ability of: healthy habits and self-management skills   Demonstrate ability to: Increase healthy adjustment to current life circumstances, Increase adequate support systems for patient/family, and Increase motivation to adhere to plan of care  Progress towards Goals: Ongoing    Interventions: Interventions utilized:  Mindfulness or Management Consultant, CBT Cognitive Behavioral Therapy, Psychoeducation and/or Health Education, and Link to [employer] Standardized Assessments completed: Not Needed  Clinical Assessment/Diagnosis  Unspecified mood (affective) disorder   Patient may benefit from psychoeducation and brief therapeutic interventions regarding coping with symptoms of depression, anxiety, insomnia, life stress; further assessment at follow up visit  Plan: Follow up with behavioral health clinician on : Two weeks Behavioral recommendations:  -CALM relaxation breathing exercise twice daily (morning; at bedtime with sleep sounds); as needed throughout the day. -Begin Worry Time strategy, as discussed. Start by setting up start and end time reminders on phone today; continue daily for two weeks. -Continue workouts at the gym and time with supportive friends -Discuss BH medication at upcoming medical appointment (provider has been made aware) -Read through information on After Visit Summary; use as needed and discussed  Referral(s): Integrated Art Gallery Manager (In Clinic) and Community Resources:  Pediatric therapy/family  I discussed the  assessment and treatment plan with the patient and/or parent/guardian. They were provided an opportunity to ask questions and all were answered. They agreed with the plan and demonstrated an understanding of the instructions.   They were advised to call back or seek an in-person evaluation  if the symptoms worsen or if the condition fails to improve as anticipated.  Warren JAYSON Mering, LCSW     04/05/2024   11:45 AM 04/20/2023   11:20 AM 04/13/2023    8:55 AM 04/01/2023    1:06 PM 12/29/2022    3:44 PM  Depression screen PHQ 2/9  Decreased Interest 3 0 2 0 3  Down, Depressed, Hopeless 3 0 1 0 2  PHQ - 2 Score 6 0 3 0 5  Altered sleeping 3  3  1   Tired, decreased energy 3  1  3   Change in appetite 3  2  3   Feeling bad or failure about yourself  3  0  0  Trouble concentrating 3  0  1  Moving slowly or fidgety/restless 0  0  0  Suicidal thoughts 0  0  0  PHQ-9 Score 21   9   13    Difficult doing work/chores   Not difficult at all  Very difficult     Data saved with a previous flowsheet row definition      04/05/2024   11:46 AM 04/13/2023    8:55 AM 06/02/2022   10:37 AM 03/25/2020    3:18 PM  GAD 7 : Generalized Anxiety Score  Nervous, Anxious, on Edge 3 0 0 0  Control/stop worrying 3 0 0 0  Worry too much - different things 3 0 0 0  Trouble relaxing 3 0 0 0  Restless 0 0 3 0  Easily annoyed or irritable 3 3 3  0  Afraid - awful might happen 3 0 0 0  Total GAD 7 Score 18 3 6  0  Anxiety Difficulty  Not difficult at all  Not difficult at all

## 2024-05-02 NOTE — Patient Instructions (Signed)
 Center for [facility] at [facility] [address] [place], [place] [address] [phone] (main office) [phone] [facility] office)   Pediatric Therapy Options:  [facility]                                                                                    [email or web address] [address], [place], [place] [address]                                   Ph: [phone]                                          Fax: [phone]                       [email or web address]  [facility] [email or web address] [address] [place], [place] [address] Ph. [phone]                          Fax. [phone]  [facility] [email or web address] [address] [place], [place] [address]   Fax [phone]  Phone: [phone]  [facility] [address]  [place], [place] [address]  Ph: [phone] Fax: [phone] [email or web address]  [facility]                           [email or web address] [address] [place], [place], [place] [address]         Ph: [phone]                                         Fax: [phone]                                      Email:  [email or web address]  [facility]                    [email or web address] [address] [place], [place] [address]                                   Ph: [phone] / [phone] Fax: [phone] // For [place] directly, phone is [phone] and fax is [phone]  [facility] [address]                              [place], [address]             [phone]  Pheobe Ivey: Intake                 [phone]    [email or web address]_qJTmrYBrVkKCETrkflcjzLBuUgqYbeZP$$MzfnczAzqnmzIZPI_qJTmrYBrVkKCETrkflcjzLBuUgqYbeZP$ .rnf  [facility])                [email or web address]                            [address], [place], [place] [address]  Ph: [phone]                                         [facility]                    [email or web address] [place]: [address], [place], [place] [address]                                 Ph: [phone]; Fax: [phone]      [place]: [address] [place], [place] [address]                                                        Ph: [phone]; Fax: [phone]  [facility]                                               [email or web address] [place]: [address], [place], [place] [address]                                 [place]: [address], [place], [place] [address]                                                              Ph: [phone];  Fax: [phone]    Email: [email or web address]  [facility]                                [email or web address] [address], [place], [place] [address]                                                                Ph: [phone]; Fax: [phone]        [facility]/ Leatrice Sharps, LPC, LCAS  [email or web address] [address], [place], [place] [address]        Ph: [phone]/ [phone]             Fax: [phone]  [facility]                           [email or web address] Address: [address] [place], [place] [address]                            Ph: [phone];  Fax: [phone]  [facility] Fax [phone] Address: [address], [place], [place] [address] Phone: [phone]  [facility] [address]  [place], [place] [address]  [phone] P  [phone]  [facility]          [email or web address] [address] [place], [place] [address]                 Ph: [phone]                                          Fax: [phone]                                      [facility]                 [email or web address] [address] [place], [place] [address]  PH:[phone] Fax: [phone]  [facility]                                   [email or web address] [address] [place], [address]nd [address], [place], [place] [address]                                Ph: [phone]; fax: [phone]      Hours: M-Th 830a-8p; Fri 930a-7p  [facility]                                        [email or web address] [address] [place] [address], [place] [address]                       Ph: [phone]                       Fax: [phone]                             [facility]                                                [email or web address] [address], [place], [place] [address]      Main phone: [phone]  [facility] [address] [place], [place] [address] Fax: [phone] Phone: [phone]

## 2024-05-15 ENCOUNTER — Encounter

## 2024-05-15 NOTE — BH Specialist Note (Deleted)
 error

## 2024-05-17 ENCOUNTER — Ambulatory Visit: Admitting: Obstetrics and Gynecology

## 2024-06-16 ENCOUNTER — Encounter (HOSPITAL_COMMUNITY): Payer: Self-pay

## 2024-06-16 ENCOUNTER — Ambulatory Visit (HOSPITAL_COMMUNITY)
Admission: EM | Admit: 2024-06-16 | Discharge: 2024-06-16 | Disposition: A | Discharge status: Home / Self Care | Attending: Emergency Medicine | Admitting: Emergency Medicine

## 2024-06-16 DIAGNOSIS — L02214 Cutaneous abscess of groin: Secondary | ICD-10-CM | POA: Diagnosis not present

## 2024-06-16 MED ORDER — CEFTRIAXONE SODIUM 1 G IJ SOLR
1.0000 g | Freq: Once | INTRAMUSCULAR | Status: AC
  Administered 2024-06-16: 1 g via INTRAMUSCULAR

## 2024-06-16 MED ORDER — IBUPROFEN 800 MG PO TABS
ORAL_TABLET | ORAL | Status: AC
  Filled 2024-06-16: qty 1

## 2024-06-16 MED ORDER — DOXYCYCLINE HYCLATE 100 MG PO CAPS: 100.0000 mg | ORAL_CAPSULE | Freq: Two times a day (BID) | ORAL | 0 refills | Status: AC

## 2024-06-16 MED ORDER — CEFTRIAXONE SODIUM 500 MG IJ SOLR
INTRAMUSCULAR | Status: AC
  Filled 2024-06-16: qty 500

## 2024-06-16 MED ORDER — IBUPROFEN 800 MG PO TABS: 800.0000 mg | ORAL_TABLET | Freq: Three times a day (TID) | ORAL | 0 refills | Status: AC

## 2024-06-16 MED ORDER — IBUPROFEN 800 MG PO TABS
800.0000 mg | ORAL_TABLET | Freq: Once | ORAL | Status: AC
  Administered 2024-06-16: 800 mg via ORAL

## 2024-06-16 NOTE — ED Provider Notes (Signed)
 " [facility]    CSN: [id number] Arrival date & time: 06/16/24  1823      History   Chief Complaint Chief Complaint  Patient presents with   Abscess    HPI Robin Strickland is a 24 y.o. female.   Patient presents with concerns for abscess to left upper thigh/groin area.  Patient states that approximately 2 days ago she had a small pimple to this area and it has become much larger very quickly over the last few days.  Patient states that she is in so much pain now that she even has some difficulty walking.  Patient states that today the pain also started to spread to the left side of her outer vagina.  Patient states that she woke up this morning with a temperature of 100.1.  Patient states that she did take Tylenol  with relief of fever, but still continues to have pain.  Patient denies any drainage from the area.  The history is provided by the patient and medical records.  Abscess   Past Medical History:  Diagnosis Date   Gestational diabetes    During 1st pregnancy.   Headache    History of gestational diabetes 04/19/2016   2021: early a1c negative.    Migraine without aura and without status migrainosus, not intractable 10/28/2017   Nephrolithiasis 03/2024   bilateral. incidental finding   UTI (urinary tract infection) during pregnancy 07/02/2019   [ ]  needs toc: neg    Patient Active Problem List   Diagnosis Date Noted   Anxiety and depression 04/05/2024   PID (acute pelvic inflammatory disease) 03/31/2024   Copper  IUD (intrauterine device), immediate postplacental placement on 07/04/23 08/23/2023    Past Surgical History:  Procedure Laterality Date   NO PAST SURGERIES      OB History     Gravida  3   Para  3   Term  3   Preterm      AB      Living  3      SAB      IAB      Ectopic      Multiple  0   Live Births  3            Home Medications    Prior to Admission medications  Medication Sig Start Date End  Date Taking? Authorizing Provider  doxycycline  (VIBRAMYCIN ) 100 MG capsule Take 1 capsule (100 mg total) by mouth 2 (two) times daily. 06/16/24  Yes Johnie, Eliyah Mcshea A, NP  ibuprofen  (ADVIL ) 800 MG tablet Take 1 tablet (800 mg total) by mouth 3 (three) times daily. 06/16/24  Yes Johnie Rumaldo LABOR, NP    Family History Family History  Problem Relation Age of Onset   Diabetes Mother    Hypertension Mother    Kidney disease Mother    Other Father        unsure of medical history   Diabetes Maternal Grandmother    Hypertension Maternal Grandmother    Kidney disease Maternal Grandmother    Asthma Neg Hx    Cancer Neg Hx    Heart disease Neg Hx     Social History Social History[1]   Allergies   Patient has no known allergies.   Review of Systems Review of Systems  Per HPI  Physical Exam Triage Vital Signs ED Triage Vitals [06/16/24 1853]  Encounter Vitals Group     BP 107/62     Girls Systolic BP Percentile  Girls Diastolic BP Percentile      Boys Systolic BP Percentile      Boys Diastolic BP Percentile      Pulse Rate 71     Resp 16     Temp 98.4 F (36.9 C)     Temp Source Oral     SpO2 94 %     Weight      Height      Head Circumference      Peak Flow      Pain Score      Pain Loc      Pain Education      Exclude from Growth Chart    No data found.  Updated Vital Signs BP 107/62 (BP Location: Left Arm)   Pulse 71   Temp 98.4 F (36.9 C) (Oral)   Resp 16   LMP 06/15/2024 (Approximate)   SpO2 94%   Visual Acuity Right Eye Distance:   Left Eye Distance:   Bilateral Distance:    Right Eye Near:   Left Eye Near:    Bilateral Near:     Physical Exam Vitals and nursing note reviewed. Chaperone present: Patient declined chaperone.  Constitutional:      General: She is awake. She is not in acute distress.    Appearance: Normal appearance. She is well-developed and well-groomed. She is not ill-appearing.  Genitourinary:    Labia:        Left:  Tenderness present.    Skin:    General: Skin is warm and dry.  Neurological:     General: No focal deficit present.     Mental Status: She is alert and oriented to person, place, and time. Mental status is at baseline.  Psychiatric:        Behavior: Behavior is cooperative.      UC Treatments / Results  Labs (all labs ordered are listed, but only abnormal results are displayed) Labs Reviewed - No data to display  EKG   Radiology No results found.  Procedures Procedures (including critical care time)  Medications Ordered in UC Medications  ibuprofen  (ADVIL ) tablet 800 mg (has no administration in time range)  cefTRIAXone  (ROCEPHIN ) injection 1 g (has no administration in time range)    Initial Impression / Assessment and Plan / UC Course  I have reviewed the triage vital signs and the nursing notes.  Pertinent labs & imaging results that were available during my care of the patient were reviewed by me and considered in my medical decision making (see chart for details).     Patient is overall well-appearing, but does appear to be in some discomfort.  Vitals are stable.  No presence of fever at this time.  Exam findings concerning for abscess.  Deferred I&D due to lack of fluctuance at this time.  Given initial dose of Rocephin  in clinic for abscess coverage.  Prescribed doxycycline  for additional abscess coverage.  Given dose of ibuprofen  in clinic for acute pain.  Prescribed additional 800 mg ibuprofen  as needed for pain.  Recommended warm compresses and warm soaks to help reduce swelling and promote drainage.  Discussed follow-up, return, and strict ER precautions. Final Clinical Impressions(s) / UC Diagnoses   Final diagnoses:  Abscess of groin, left     Discharge Instructions      You are given direction of Rocephin  in clinic today for initial treatment of abscess. Start taking doxycycline  tonight twice daily for 10 days for additional treatment of this. You  are  given a dose of 800 mg ibuprofen  in clinic today for your pain.  You can take another dose of this 8 hours from now for additional pain relief. You can also take 500 to 1000 mg of Tylenol  every 6-8 hours as needed for breakthrough pain. Apply warm compresses (warm wet washcloth) and soak in a tub of warm water to help reduce swelling and promote drainage. If you develop spreading of redness, increased swelling, increased pain, or high fever over the next 24 hours please seek immediate medical treatment in the emergency department. Otherwise follow-up with your primary care provider or return here as needed.     ED Prescriptions     Medication Sig Dispense Auth. Provider   ibuprofen  (ADVIL ) 800 MG tablet Take 1 tablet (800 mg total) by mouth 3 (three) times daily. 21 tablet Johnie Flaming A, NP   doxycycline  (VIBRAMYCIN ) 100 MG capsule Take 1 capsule (100 mg total) by mouth 2 (two) times daily. 20 capsule Johnie Flaming A, NP      PDMP not reviewed this encounter.    [1]  Social History Tobacco Use   Smoking status: Never   Smokeless tobacco: Never  Vaping Use   Vaping status: Never Used  Substance Use Topics   Alcohol use: No   Drug use: No     Johnie Flaming LABOR, NP 06/16/24 1952  "

## 2024-06-16 NOTE — Discharge Instructions (Signed)
 You are given direction of Rocephin  in clinic today for initial treatment of abscess. Start taking doxycycline  tonight twice daily for 10 days for additional treatment of this. You are given a dose of 800 mg ibuprofen  in clinic today for your pain.  You can take another dose of this 8 hours from now for additional pain relief. You can also take 500 to 1000 mg of Tylenol  every 6-8 hours as needed for breakthrough pain. Apply warm compresses (warm wet washcloth) and soak in a tub of warm water to help reduce swelling and promote drainage. If you develop spreading of redness, increased swelling, increased pain, or high fever over the next 24 hours please seek immediate medical treatment in the emergency department. Otherwise follow-up with your primary care provider or return here as needed.

## 2024-06-16 NOTE — ED Triage Notes (Signed)
 PT presents to the office for abscess on the upper side thigh near her groin area.

## 2024-07-09 ENCOUNTER — Ambulatory Visit: Admitting: Neurology

## 2024-07-09 ENCOUNTER — Telehealth: Payer: Self-pay | Admitting: Neurology

## 2024-07-09 NOTE — Telephone Encounter (Signed)
 07/09/2024 appt cancelled due to weather.

## 2024-07-13 ENCOUNTER — Other Ambulatory Visit: Payer: Self-pay

## 2024-07-13 NOTE — Telephone Encounter (Signed)
 Pt called to request sooner appt ,pt has been having really bad migraines and doesn't know what to do about the patient stated  she really need appt that was rescheduled  and can not take 03/19 for  its to far
